# Patient Record
Sex: Female | Born: 1965 | Race: White | Hispanic: No | Smoking: Never smoker
Health system: Southern US, Community
[De-identification: ages and names within clinical notes are randomized; demographics above are authoritative.]

## PROBLEM LIST (undated history)

## (undated) DIAGNOSIS — R112 Nausea with vomiting, unspecified: Secondary | ICD-10-CM

## (undated) DIAGNOSIS — F329 Major depressive disorder, single episode, unspecified: Secondary | ICD-10-CM

## (undated) DIAGNOSIS — H269 Unspecified cataract: Secondary | ICD-10-CM

## (undated) DIAGNOSIS — F32A Depression, unspecified: Secondary | ICD-10-CM

## (undated) DIAGNOSIS — Z9889 Other specified postprocedural states: Secondary | ICD-10-CM

## (undated) DIAGNOSIS — C50919 Malignant neoplasm of unspecified site of unspecified female breast: Secondary | ICD-10-CM

## (undated) HISTORY — PX: WISDOM TOOTH EXTRACTION: SHX21

## (undated) HISTORY — DX: Malignant neoplasm of unspecified site of unspecified female breast: C50.919

## (undated) HISTORY — PX: ENDOMETRIAL ABLATION: SHX621

## (undated) HISTORY — PX: BASAL CELL CARCINOMA EXCISION: SHX1214

## (undated) HISTORY — PX: PLACEMENT OF BREAST IMPLANTS: SHX6334

## (undated) HISTORY — DX: Unspecified cataract: H26.9

## (undated) HISTORY — PX: DILATION AND CURETTAGE OF UTERUS: SHX78

---

## 1998-02-14 HISTORY — PX: REFRACTIVE SURGERY: SHX103

## 2003-09-02 ENCOUNTER — Other Ambulatory Visit: Admission: RE | Admit: 2003-09-02 | Discharge: 2003-09-02 | Payer: Self-pay | Admitting: Internal Medicine

## 2004-12-22 ENCOUNTER — Other Ambulatory Visit: Admission: RE | Admit: 2004-12-22 | Discharge: 2004-12-22 | Payer: Self-pay | Admitting: Internal Medicine

## 2005-03-29 ENCOUNTER — Encounter: Admission: RE | Admit: 2005-03-29 | Discharge: 2005-03-29 | Payer: Self-pay | Admitting: Internal Medicine

## 2005-12-19 ENCOUNTER — Other Ambulatory Visit: Admission: RE | Admit: 2005-12-19 | Discharge: 2005-12-19 | Payer: Self-pay | Admitting: Family Medicine

## 2006-05-02 ENCOUNTER — Encounter: Admission: RE | Admit: 2006-05-02 | Discharge: 2006-05-02 | Payer: Self-pay | Admitting: Family Medicine

## 2008-02-15 HISTORY — PX: ANTERIOR CRUCIATE LIGAMENT REPAIR: SHX115

## 2008-07-01 ENCOUNTER — Other Ambulatory Visit: Admission: RE | Admit: 2008-07-01 | Discharge: 2008-07-01 | Payer: Self-pay | Admitting: Family Medicine

## 2008-08-25 ENCOUNTER — Other Ambulatory Visit: Admission: RE | Admit: 2008-08-25 | Discharge: 2008-08-25 | Payer: Self-pay | Admitting: Family Medicine

## 2009-11-25 ENCOUNTER — Other Ambulatory Visit: Admission: RE | Admit: 2009-11-25 | Discharge: 2009-11-25 | Payer: Self-pay | Admitting: Family Medicine

## 2009-12-16 ENCOUNTER — Ambulatory Visit (HOSPITAL_COMMUNITY): Admission: RE | Admit: 2009-12-16 | Discharge: 2009-12-16 | Payer: Self-pay | Admitting: Family Medicine

## 2010-02-26 ENCOUNTER — Encounter
Admission: RE | Admit: 2010-02-26 | Discharge: 2010-02-26 | Payer: Self-pay | Source: Home / Self Care | Attending: Family Medicine | Admitting: Family Medicine

## 2010-12-02 ENCOUNTER — Other Ambulatory Visit: Payer: Self-pay | Admitting: Obstetrics and Gynecology

## 2010-12-02 ENCOUNTER — Other Ambulatory Visit (HOSPITAL_COMMUNITY)
Admission: RE | Admit: 2010-12-02 | Discharge: 2010-12-02 | Disposition: A | Discharge status: Home / Self Care | Payer: BC Managed Care – PPO | Source: Ambulatory Visit | Attending: Obstetrics and Gynecology | Admitting: Obstetrics and Gynecology

## 2010-12-02 DIAGNOSIS — Z01419 Encounter for gynecological examination (general) (routine) without abnormal findings: Secondary | ICD-10-CM | POA: Insufficient documentation

## 2011-01-31 ENCOUNTER — Other Ambulatory Visit (HOSPITAL_COMMUNITY): Payer: Self-pay | Admitting: Family Medicine

## 2011-01-31 DIAGNOSIS — R197 Diarrhea, unspecified: Secondary | ICD-10-CM

## 2011-02-18 ENCOUNTER — Other Ambulatory Visit (HOSPITAL_COMMUNITY): Payer: BC Managed Care – PPO

## 2011-06-30 ENCOUNTER — Other Ambulatory Visit: Payer: Self-pay | Admitting: Dermatology

## 2011-12-06 ENCOUNTER — Other Ambulatory Visit (HOSPITAL_COMMUNITY)
Admission: RE | Admit: 2011-12-06 | Discharge: 2011-12-06 | Disposition: A | Discharge status: Home / Self Care | Payer: BC Managed Care – PPO | Source: Ambulatory Visit | Attending: Obstetrics and Gynecology | Admitting: Obstetrics and Gynecology

## 2011-12-06 ENCOUNTER — Other Ambulatory Visit: Payer: Self-pay | Admitting: Obstetrics and Gynecology

## 2011-12-06 DIAGNOSIS — Z01419 Encounter for gynecological examination (general) (routine) without abnormal findings: Secondary | ICD-10-CM | POA: Insufficient documentation

## 2012-03-20 ENCOUNTER — Other Ambulatory Visit: Payer: Self-pay | Admitting: Radiology

## 2012-03-20 DIAGNOSIS — C50919 Malignant neoplasm of unspecified site of unspecified female breast: Secondary | ICD-10-CM

## 2012-03-20 HISTORY — DX: Malignant neoplasm of unspecified site of unspecified female breast: C50.919

## 2012-03-21 ENCOUNTER — Other Ambulatory Visit: Payer: Self-pay | Admitting: Radiology

## 2012-03-21 DIAGNOSIS — C50911 Malignant neoplasm of unspecified site of right female breast: Secondary | ICD-10-CM

## 2012-03-22 ENCOUNTER — Telehealth: Payer: Self-pay | Admitting: *Deleted

## 2012-03-22 DIAGNOSIS — C50419 Malignant neoplasm of upper-outer quadrant of unspecified female breast: Secondary | ICD-10-CM | POA: Insufficient documentation

## 2012-03-22 NOTE — Telephone Encounter (Signed)
Confirmed [facility] for 03/28/12 at 0800 .  Instructions and contact information given.

## 2012-03-23 ENCOUNTER — Ambulatory Visit
Admission: RE | Admit: 2012-03-23 | Discharge: 2012-03-23 | Disposition: A | Discharge status: Home / Self Care | Payer: BC Managed Care – PPO | Source: Ambulatory Visit | Attending: Radiology | Admitting: Radiology

## 2012-03-23 DIAGNOSIS — C50911 Malignant neoplasm of unspecified site of right female breast: Secondary | ICD-10-CM

## 2012-03-23 MED ORDER — GADOBENATE DIMEGLUMINE 529 MG/ML IV SOLN: 15.0000 mL | Freq: Once | INTRAVENOUS | Status: AC | PRN

## 2012-03-28 ENCOUNTER — Encounter: Payer: Self-pay | Admitting: *Deleted

## 2012-03-28 ENCOUNTER — Ambulatory Visit
Admission: RE | Admit: 2012-03-28 | Discharge: 2012-03-28 | Disposition: A | Discharge status: Home / Self Care | Payer: BC Managed Care – PPO | Source: Ambulatory Visit | Attending: Radiation Oncology | Admitting: Radiation Oncology

## 2012-03-28 ENCOUNTER — Ambulatory Visit: Payer: BC Managed Care – PPO | Attending: Surgery | Admitting: Physical Therapy

## 2012-03-28 ENCOUNTER — Ambulatory Visit (HOSPITAL_BASED_OUTPATIENT_CLINIC_OR_DEPARTMENT_OTHER): Payer: BC Managed Care – PPO | Admitting: Oncology

## 2012-03-28 ENCOUNTER — Ambulatory Visit (HOSPITAL_BASED_OUTPATIENT_CLINIC_OR_DEPARTMENT_OTHER): Payer: BC Managed Care – PPO

## 2012-03-28 ENCOUNTER — Other Ambulatory Visit (HOSPITAL_BASED_OUTPATIENT_CLINIC_OR_DEPARTMENT_OTHER): Payer: BC Managed Care – PPO | Admitting: Lab

## 2012-03-28 ENCOUNTER — Encounter (INDEPENDENT_AMBULATORY_CARE_PROVIDER_SITE_OTHER): Payer: Self-pay | Admitting: Surgery

## 2012-03-28 ENCOUNTER — Ambulatory Visit (HOSPITAL_BASED_OUTPATIENT_CLINIC_OR_DEPARTMENT_OTHER): Payer: BLUE CROSS/BLUE SHIELD | Admitting: Surgery

## 2012-03-28 VITALS — BP 139/89 | HR 71 | Temp 97.8°F | Resp 20 | Ht 65.75 in | Wt 166.0 lb

## 2012-03-28 VITALS — BP 139/89 | HR 71 | Temp 97.8°F | Resp 20 | Ht 65.75 in | Wt 166.1 lb

## 2012-03-28 DIAGNOSIS — Z171 Estrogen receptor negative status [ER-]: Secondary | ICD-10-CM

## 2012-03-28 DIAGNOSIS — C50419 Malignant neoplasm of upper-outer quadrant of unspecified female breast: Secondary | ICD-10-CM

## 2012-03-28 DIAGNOSIS — C50919 Malignant neoplasm of unspecified site of unspecified female breast: Secondary | ICD-10-CM

## 2012-03-28 DIAGNOSIS — M25619 Stiffness of unspecified shoulder, not elsewhere classified: Secondary | ICD-10-CM | POA: Insufficient documentation

## 2012-03-28 DIAGNOSIS — IMO0001 Reserved for inherently not codable concepts without codable children: Secondary | ICD-10-CM | POA: Insufficient documentation

## 2012-03-28 DIAGNOSIS — C50911 Malignant neoplasm of unspecified site of right female breast: Secondary | ICD-10-CM

## 2012-03-28 DIAGNOSIS — Z803 Family history of malignant neoplasm of breast: Secondary | ICD-10-CM

## 2012-03-28 DIAGNOSIS — Z8041 Family history of malignant neoplasm of ovary: Secondary | ICD-10-CM

## 2012-03-28 LAB — CBC WITH DIFFERENTIAL/PLATELET
BASO%: 0.6 % (ref 0.0–2.0)
EOS%: 2 % (ref 0.0–7.0)
HCT: 39.1 % (ref 34.8–46.6)
LYMPH%: 33.2 % (ref 14.0–49.7)
MCH: 34 pg (ref 25.1–34.0)
MCHC: 35.6 g/dL (ref 31.5–36.0)
MCV: 95.6 fL (ref 79.5–101.0)
MONO%: 10.5 % (ref 0.0–14.0)
NEUT%: 53.7 % (ref 38.4–76.8)
lymph#: 2 10*3/uL (ref 0.9–3.3)

## 2012-03-28 LAB — COMPREHENSIVE METABOLIC PANEL (CC13)
ALT: 23 U/L (ref 0–55)
AST: 26 U/L (ref 5–34)
Alkaline Phosphatase: 58 U/L (ref 40–150)
BUN: 12.1 mg/dL (ref 7.0–26.0)
Creatinine: 0.9 mg/dL (ref 0.6–1.1)
Total Bilirubin: 1.06 mg/dL (ref 0.20–1.20)

## 2012-03-28 NOTE — Progress Notes (Signed)
Patient ID: Norma [name], female   DOB: 15-Jul-1965, 47 y.o.   MRN: [id number]  Chief Complaint  Patient presents with  . Breast Cancer    right, possible left    HPI Norma [name] is a 47 y.o. female.  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 She is asymptomatic. She had a mammogram showing an area of architectural distortion a biopsy has shown cancer. An MRI showed a very suspicious area on the left side. She strong family history of different types of cancer and one family member is BRCA2 positive. She is having no other breast symptoms. HPI  Past Medical History  Diagnosis Date  . Breast cancer     Past Surgical History  Procedure Laterality Date  . Anterior cruciate ligament repair  2010    Family History  Problem Relation Age of Onset  . Breast cancer Maternal Aunt   . Ovarian cancer Maternal Aunt   . Esophageal cancer Maternal Uncle   . Liver cancer Maternal Uncle   . Lung cancer Maternal Uncle   . Colon cancer Paternal Aunt   . Breast cancer Cousin   . Pancreatic cancer Maternal Uncle     Social History History  Substance Use Topics  . Smoking status: Never Smoker   . Smokeless tobacco: Not on file  . Alcohol Use: Yes    Allergies  Allergen Reactions  . Penicillins Nausea Only and Rash    Current Outpatient Prescriptions  Medication Sig Dispense Refill  . buPROPion (WELLBUTRIN XL) 300 MG 24 hr tablet Take 300 mg by mouth daily.      Marland Kitchen escitalopram (LEXAPRO) 10 MG tablet Take 10 mg by mouth daily.       No current facility-administered medications for this visit.    Review of Systems Review of Systems  Constitutional: Negative for fever, chills and unexpected weight change.  HENT: Negative for hearing loss, congestion, sore throat, trouble swallowing and voice change.   Eyes: Negative for visual disturbance.  Respiratory: Negative for cough and wheezing.    Cardiovascular: Negative for chest pain, palpitations and leg swelling.  Gastrointestinal: Negative for nausea, vomiting, abdominal pain, diarrhea, constipation, blood in stool, abdominal distention and anal bleeding.  Genitourinary: Negative for hematuria, vaginal bleeding and difficulty urinating.  Musculoskeletal: Negative for arthralgias.  Skin: Negative for rash and wound.  Neurological: Negative for seizures, syncope and headaches.  Hematological: Negative for adenopathy. Does not bruise/bleed easily.  Psychiatric/Behavioral: Negative for confusion.    Blood pressure 139/89, pulse 71, temperature 97.8 F (36.6 C), resp. rate 20, height 5' 5.75" (1.67 m), weight 166 lb (75.297 kg).  Physical Exam Physical Exam  Vitals reviewed. Constitutional: She is oriented to person, place, and time. She appears well-developed and well-nourished. No distress.  HENT:  Head: Normocephalic and atraumatic.  Mouth/Throat: Oropharynx is clear and moist.  Eyes: Conjunctivae and EOM are normal. Pupils are equal, round, and reactive to light. No scleral icterus.  Neck: Normal range of motion. Neck supple. No tracheal deviation present. No thyromegaly present.  Cardiovascular: Normal rate, regular rhythm, normal heart sounds and intact distal pulses.  Exam reveals no gallop and no friction rub.   No murmur heard. Pulmonary/Chest: Effort normal and breath sounds normal. No respiratory distress. She has no wheezes. She has no rales. Right breast exhibits no inverted nipple, no mass, no nipple discharge, no skin change and no tenderness. Left breast exhibits no mass, no nipple discharge, no  skin change and no tenderness. Breasts are symmetrical.  Abdominal: Soft. Bowel sounds are normal. She exhibits no distension and no mass. There is no tenderness. There is no rebound and no guarding.  Musculoskeletal: Normal range of motion. She exhibits no edema and no tenderness.  Lymphadenopathy:    She has no cervical  adenopathy.    She has no axillary adenopathy.       Right: No supraclavicular adenopathy present.       Left: No supraclavicular adenopathy present.  Neurological: She is alert and oriented to person, place, and time.  Skin: Skin is warm and dry. No rash noted. She is not diaphoretic. No erythema.  Psychiatric: She has a normal mood and affect. Her behavior is normal. Judgment and thought content normal.    Data Reviewed I have reviewed the mammogram films and reports, the MRI films and reports and review them with the radiologist. I seen the pathology slides to review them with the pathologist. I discussed her situation with medical and radiation oncologist.  Assessment    Right breast cancer, invasive ductal, triple negative Suspicious mass left breast and, biopsy pending Family history of BRCA2 positive     Plan    I have explained the pathophysiology and staging of breast cancer with particular attention to her exact situation. We discussed the multidisciplinary approach to breast cancer which often includes both medical and radiation oncology consultations.  We also discussed surgical options for the treatment of breast cancer including lumpectomy and mastectomy with possible reconstructive surgery. In addition we talked about the evaluation and management of lymph nodes including a description of sentinel lymph node biopsy and axillary dissections. We reviewed potential complications and risks including bleeding, infection, numbness,  lymphedema, and the potential need for additional surgery.  She understands that for patients who are candidate for lumpectomy or mastectomy there is an equal survival rate with either technique, but a slightly higher local recurrence rate with lumpectomy. In addition she knows that a lumpectomy usually requires postoperative radiation as part of the management of the breast cancer.  We have discussed the likely postoperative course and plans for  followup.  I have given the patient some written information that reviewed all of these issues. I believe her questions are answered and that she has a good understanding of the issues.  Really uncertain of what to do. If her genetic history is negative I think she could do bilateral lumpectomy sentinel nodes with postoperative radiation therapy. An alternative would be bilateral mastectomies and she would be a candidate for immediate reconstruction it was discussed that. She has a Dentist at appointment pending. I think she needs some time to all over her options and situation and make a decision. She is currently scheduled for a biopsy of the left side to confirm whether or not this is malignant.        Annaliesa Blann J 03/28/2012, 11:21 AM

## 2012-03-28 NOTE — Progress Notes (Signed)
Radiation Oncology         [id number]) [phone] ________________________________  Initial outpatient Consultation - Date: 03/28/2012   Name: Norma [name] MRN: [id number]   DOB: 05/01/1965  REFERRING PHYSICIAN: Currie Paris, MD  DIAGNOSIS: The encounter diagnosis was Cancer of upper-outer quadrant of female breast.  HISTORY OF PRESENT ILLNESS::Norma [name] is a 47 y.o. female  who has a history of a cousin with breast cancer. This cousin tested positive for a unknown mutation. She was undergoing regular screening mammograms when a architectural distortion was noted in the right breast. Ultrasound confirmed a 3 x 4 x 3 mm mass. MRI showed a 2 x 1 x 1 cm mass in the area of concern. Biopsy was positive for a grade 2 invasive ductal carcinoma which was triple negative with a Ki-67 of 100%. MRI also showed an abnormality measuring 2 x 1.3 x 1.1 cm. A second look ultrasound was negative. An MRI biopsy is pending. He is a well since her biopsy. She has met with Dr. Darnelle Catalan and Dr. Jamey Ripa. She has no breast related complaints. She has no history of breast problems.  PREVIOUS RADIATION THERAPY: No  PAST MEDICAL HISTORY:  has a past medical history of Breast cancer.    PAST SURGICAL HISTORY: Past Surgical History  Procedure Laterality Date  . Anterior cruciate ligament repair  2010    FAMILY HISTORY: @[facility] @  SOCIAL HISTORY:  History  Substance Use Topics  . Smoking status: Never Smoker   . Smokeless tobacco: Not on file  . Alcohol Use: Yes    ALLERGIES: Penicillins  MEDICATIONS:  Current Outpatient Prescriptions  Medication Sig Dispense Refill  . buPROPion (WELLBUTRIN XL) 300 MG 24 hr tablet Take 300 mg by mouth daily.      Marland Kitchen escitalopram (LEXAPRO) 10 MG tablet Take 10 mg by mouth daily.       No current facility-administered medications for this encounter.    REVIEW OF SYSTEMS:  A 15 point review of systems is documented in the electronic medical record. This was obtained by  the nursing staff. However, I reviewed this with the patient to discuss relevant findings and make appropriate changes.  Pertinent items are noted in HPI.   PHYSICAL EXAM: There were no vitals filed for this visit.. She is a pleasant female who appears her stated age sitting comfortably examining table. She has no palpable cervical subclavicular or axillary adenopathy bilaterally. She has bruising slightly of the right breast. No abnormalities palpable up the left breast. She is alert minus x3. She has 5 out of 5 strength bilaterally.  LABORATORY DATA:  Lab Results  Component Value Date   WBC 6.1 03/28/2012   HGB 13.9 03/28/2012   HCT 39.1 03/28/2012   MCV 95.6 03/28/2012   PLT 202 03/28/2012   Lab Results  Component Value Date   NA 139 03/28/2012   K 4.6 03/28/2012   CL 105 03/28/2012   CO2 26 03/28/2012   Lab Results  Component Value Date   ALT 23 03/28/2012   AST 26 03/28/2012   ALKPHOS 58 03/28/2012   BILITOT 1.06 03/28/2012     RADIOGRAPHY: Mr Breast Bilateral W Wo Contrast  03/23/2012  *RADIOLOGY REPORT*  Clinical Data: new diagnosis of invasive ductal carcinoma right 9 o'clock, for treatment planning  BILATERAL BREAST MRI WITH AND WITHOUT CONTRAST  Technique: Multiplanar, multisequence MR images of both breasts were obtained prior to and following the intravenous administration of 15ml of Multihance.  Three dimensional images were evaluated  at the independent DynaCad workstation.  Comparison:  bilateral mammogram 03/15/12  Findings: There is moderate background parenchymal enhancement. There is no adenopathy and there is no abnormal skin or nipple enhancement.  On the right, there is marker clip artifact in the posterior 9 o'clock position at the site of recent biopsy demonstrating invasive carcinoma.  The clip is located centrally within an area of poorly defined enhancement which measures about 2 x 1 x 1cm in diameter and which is mostly obscured by clip artifact.  On the left, posteriorly in  the 2 o'clock position, there is an enhancing mass measuring 20 x 13 x 11mm, showing mostly persistent and plateau kinetics with a tubular macrolobulated appearnce.  It also demonstrates mild internal and surrounding T2 hyperintensity with no corresponding findings on the recent mammogram.  IMPRESSION: In addition to the know carcinoma on the right, there is also an area of enhancement on the left in the posterior 2 o'clock position that is suspicious for possible mass.  BI-RADS CATEGORY 4:  Suspicious abnormality - biopsy should be considered.  RECOMMENDATION: A second look left ultrasound is recommended, with subsequent ultrasound-guided core biopsy if the finding is sonographically visible.  If the mass in the 2 o'clock position of the left breast is not visible by ultrasound, then MRI-guided core needle biopsy would be suggested.  THREE-DIMENSIONAL MR IMAGE RENDERING ON INDEPENDENT WORKSTATION:  Three-dimensional MR images were rendered by post-processing of the original MR data on an independent workstation.  The three- dimensional MR images were interpreted, and findings were reported in the accompanying complete MRI report for this study.   Original Report Authenticated By: Esperanza Heir, M.D.       IMPRESSION: T2 N0 invasive ductal carcinoma of the right breast and a possible abnormality of the left breast in a patient with a family history of a genetic mutation  PLAN: I spoke to the patient her family. She is unclear as to what decision she would like to move forward with. We discussed the genetics prior to her surgery and are getting a set up for her. We discussed that there really is no survival benefit to 2 bilateral mastectomies in a patient who carries a genetic medication. We discussed that if she chose she could have bilateral breast conservation. This of course is if this abnormality and left breast are.be positive. If she does elect a mastectomy she will would likely not require radiation and  I think she would be a candidate for immediate reconstruction. The only reason she would require postmastectomy radiation is if she had a positive lymph node, positive margins, or a tumor size that was Baxley greater than what we see on MRI. If she would like to proceed forward with bilateral breast conservation she would require bilateral breast radiation. We discussed the process of simulation the placement tattoos. We discussed skin redness and fatigue as possible side effects. We discussed 6 weeks of treatment as an outpatient. She of course will have a Port-A-Cath placed and chemotherapy. He has met with our physical therapist as well as member of our patient family support staff. I have not scheduled followup with me. I would be happy to see her back after her chemotherapy in her surgery either to discuss radiation in the postlumpectomy setting or to discuss why she would not need radiation in the postmastectomy setting.  I spent 40 minutes  face to face with the patient and more than 50% of that time was spent in counseling and/or coordination of  care.   ------------------------------------------------  [name], MD

## 2012-03-28 NOTE — Patient Instructions (Signed)
After you have seen and discussed your situation with a genetic counselor and havesome time to think over your options we can make definitive surgical plans.

## 2012-03-29 ENCOUNTER — Telehealth: Payer: Self-pay | Admitting: Oncology

## 2012-03-29 LAB — CANCER ANTIGEN 27.29: CA 27.29: 23 U/mL (ref 0–39)

## 2012-03-29 NOTE — Telephone Encounter (Signed)
Sent [facility]a [name] a staff message regarding dr Darnelle Catalan wanting an appt with her asap.

## 2012-03-29 NOTE — Telephone Encounter (Signed)
[facility] adviisng the pt of her chemo educ class along with the md appt in feb with dr Darnelle Catalan

## 2012-03-29 NOTE — Progress Notes (Signed)
ID: Norma [name]   DOB: [birth year]  MR#: [id number]  WUJ#:[id number]  PCP: Norma [name] GYN: Norma [name] SU: Norma [name] OTHER MD: Norma [name], Norma [name], Norma [name] [name]   HISTORY OF PRESENT ILLNESS: Norma [name] had routine screening mammography at [name] 11/16/2010 suggesting additional imaging on the right. This was performed the next day, and found no significant residual abnormality. Repeat screening mammography 03/15/2012 again suggested an area of architectural distortion in the right breast. In the same quadrant as before. Additional views 03/19/2012 found a 3 mm hypoechoic mass in the area in question. Biopsy of this mass 03/20/2012 showed an invasive ductal carcinoma, grade 1-2, triple negative, with an MIB-1 of 100%.  Breast MRI 03/23/2012 showed an area of poorly defined enhancement in the right breast associated with clip artifact. This measured 1 cm. In the left breast there was an enhancing mass measuring 2.0 cm. There were no other suspicious findings. The patient's subsequent history is as detailed below.  INTERVAL HISTORY: Norma [name] was seen at the multidisciplinary breast cancer clinic 03/28/2012, accompanied by her husband Norma [name]  REVIEW OF SYSTEMS: She was having no symptoms leading to the mammogram, which was routine. She denies any unusual headaches, visual changes, cough, phlegm production, pleurisy, shortness of breath, chest pain or pressure, any change in bowel or bladder habits, or any rash, bleeding, unexplained fatigue, unexplained weight loss, or fever. A detailed review of systems today was noncontributory.  PAST MEDICAL HISTORY: Past Medical History  Diagnosis Date  . Breast cancer     PAST SURGICAL HISTORY: Past Surgical History  Procedure Laterality Date  . Anterior cruciate ligament repair  2010  Moh's surgery for basal cell, Left nares  FAMILY HISTORY Family History  Problem Relation Age of Onset  . Breast cancer Maternal Aunt   . Ovarian cancer  Maternal Aunt   . Esophageal cancer Maternal Uncle   . Liver cancer Maternal Uncle   . Lung cancer Maternal Uncle   . Colon cancer Paternal Aunt   . Breast cancer Cousin   . Pancreatic cancer Maternal Uncle    the patient's parents are alive, in their early 60s. The patient has one brother and one sister. There is a significant family history of cancer and this includes one of the patient's mother is 2 sisters with ovarian and breast cancer. A first cousin of the patient had breast cancer in her 30s. She has been tested for the BRCA gene, but the patient does not know the results. There were also brothers of the patient's mother with esophageal pancreas and colon cancer. The patient has been scheduled for genetic testing.  GYNECOLOGIC HISTORY: Menarche age 37, first live birth age 42, she is GX P2. The patient's periods are increase in bili scans and irregular, but still ongoing. She did take birth control pills for approximately 10 years with no untoward events.  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 Her husband Norma [name] is a Academic librarian. He owns his own business. He is originally from [name]. Their children are Norma [name] 17 and Norma [name] 14.   ADVANCED DIRECTIVES: Not in place. If both she and her husband become disabled and cannot make her own health care decisions, the patient intends to name her friend Norma [name] as healthcare power of attorney. Otherwise of course she and [facility]a [name] will be each other self care power of attorney  HEALTH MAINTENANCE: History  Substance Use Topics  . Smoking status: Never Smoker   .  Smokeless tobacco: Not on file  . Alcohol Use: Yes     Colonoscopy:  PAP:  Bone density:  Lipid panel:  Allergies  Allergen Reactions  . Penicillins Nausea Only and Rash    Current Outpatient Prescriptions  Medication Sig Dispense Refill  . buPROPion (WELLBUTRIN XL) 300 MG 24 hr tablet Take 300 mg by mouth  daily.      Marland Kitchen escitalopram (LEXAPRO) 10 MG tablet Take 10 mg by mouth daily.       No current facility-administered medications for this visit.    OBJECTIVE: Middle-aged white woman who appears well Filed Vitals:   03/28/12 0843  BP: 139/89  Pulse: 71  Temp: 97.8 F (36.6 C)  Resp: 20     Body mass index is 27.01 kg/(m^2).    ECOG FS: 0  Sclerae unicteric Oropharynx clear No cervical or supraclavicular adenopathy Lungs no rales or rhonchi Heart regular rate and rhythm Abd benign MSK no focal spinal tenderness, no peripheral edema Neuro: nonfocal Breasts: The right breast is status post recent biopsy. There is a minimal ecchymosis. The right axilla is benign. I do not palpate a definite mass in the left breast.   LAB RESULTS: Lab Results  Component Value Date   WBC 6.1 03/28/2012   NEUTROABS 3.3 03/28/2012   HGB 13.9 03/28/2012   HCT 39.1 03/28/2012   MCV 95.6 03/28/2012   PLT 202 03/28/2012      Chemistry      Component Value Date/Time   NA 139 03/28/2012 0826   K 4.6 03/28/2012 0826   CL 105 03/28/2012 0826   CO2 26 03/28/2012 0826   BUN 12.1 03/28/2012 0826   CREATININE 0.9 03/28/2012 0826      Component Value Date/Time   CALCIUM 9.3 03/28/2012 0826   ALKPHOS 58 03/28/2012 0826   AST 26 03/28/2012 0826   ALT 23 03/28/2012 0826   BILITOT 1.06 03/28/2012 0826       Lab Results  Component Value Date   LABCA2 23 03/28/2012    No components found with this basename: [id number]    No results found for this basename: INR,  in the last 168 hours  Urinalysis No results found for this basename: colorurine, appearanceur, labspec, phurine, glucoseu, hgbur, bilirubinur, ketonesur, proteinur, urobilinogen, nitrite, leukocytesur    STUDIES: Mr Breast Bilateral W Wo Contrast  03/23/2012  *RADIOLOGY REPORT*  Clinical Data: new diagnosis of invasive ductal carcinoma right 9 o'clock, for treatment planning  BILATERAL BREAST MRI WITH AND WITHOUT CONTRAST  Technique: Multiplanar,  multisequence MR images of both breasts were obtained prior to and following the intravenous administration of 15ml of Multihance.  Three dimensional images were evaluated at the independent DynaCad workstation.  Comparison:  bilateral mammogram 03/15/12  Findings: There is moderate background parenchymal enhancement. There is no adenopathy and there is no abnormal skin or nipple enhancement.  On the right, there is marker clip artifact in the posterior 9 o'clock position at the site of recent biopsy demonstrating invasive carcinoma.  The clip is located centrally within an area of poorly defined enhancement which measures about 2 x 1 x 1cm in diameter and which is mostly obscured by clip artifact.  On the left, posteriorly in the 2 o'clock position, there is an enhancing mass measuring 20 x 13 x 11mm, showing mostly persistent and plateau kinetics with a tubular macrolobulated appearnce.  It also demonstrates mild internal and surrounding T2 hyperintensity with no corresponding findings on the recent mammogram.  IMPRESSION: In  addition to the know carcinoma on the right, there is also an area of enhancement on the left in the posterior 2 o'clock position that is suspicious for possible mass.  BI-RADS CATEGORY 4:  Suspicious abnormality - biopsy should be considered.  RECOMMENDATION: A second look left ultrasound is recommended, with subsequent ultrasound-guided core biopsy if the finding is sonographically visible.  If the mass in the 2 o'clock position of the left breast is not visible by ultrasound, then MRI-guided core needle biopsy would be suggested.  THREE-DIMENSIONAL MR IMAGE RENDERING ON INDEPENDENT WORKSTATION:  Three-dimensional MR images were rendered by post-processing of the original MR data on an independent workstation.  The three- dimensional MR images were interpreted, and findings were reported in the accompanying complete MRI report for this study.   Original Report Authenticated By: Esperanza Heir, M.D.     ASSESSMENT: 47 y.o. Pura Spice woman status post right breast biopsy 03/20/2012 for a clinical T1a N0, stage IA invasive ductal carcinoma, grade 1-2, triple negative, with an MIB-1 of 100%  (2) biopsy of a clinical T2 N0, stage IIA left breast mass is pending  (3) genetics testing in progress  PLAN: We spent the better part of her off I and long visit today discussing the biology of breast cancer in general and treatment options. In her particular situation, she will definitely need chemotherapy since that is the only available systemic treatment for the right-sided breast cancer. Of course we do not know what she has in the left breast.   We also discussed extensively the difference between bilateral lumpectomies with radiation, and bilateral mastectomies without postmastectomy radiation. The patient understands there is no survival advantage to either of these approaches. On the other hand if she does have a BRCA mutation, her risk of developing another breast cancer in the future may be sufficiently large that she may prefer bilateral mastectomies.  Accordingly we have requested that genetics testing be done urgently. She will in any case need a port. She will benefit from coming to "chemotherapy school". Tentatively I am making her a return appointment with me for 6 weeks, but of course we will be glad to see her before then especially if she needs further discussion to make her definitive local treatment decisions.   MAGRINAT,GUSTAV C    03/29/2012

## 2012-03-30 ENCOUNTER — Telehealth: Payer: Self-pay | Admitting: *Deleted

## 2012-03-30 ENCOUNTER — Telehealth: Payer: Self-pay | Admitting: Oncology

## 2012-03-30 NOTE — Telephone Encounter (Signed)
[facility] advising the pt of her genetic testing appt on 04/03/2012@10 :00am

## 2012-03-30 NOTE — Telephone Encounter (Signed)
Pt returned my call and I confirmed 04/03/12 genetic appt w/ pt.

## 2012-03-30 NOTE — Telephone Encounter (Signed)
Left message for pt to return my call so I can confirm her genetic appt.

## 2012-04-02 ENCOUNTER — Other Ambulatory Visit: Payer: Self-pay | Admitting: Radiology

## 2012-04-02 DIAGNOSIS — R928 Other abnormal and inconclusive findings on diagnostic imaging of breast: Secondary | ICD-10-CM

## 2012-04-03 ENCOUNTER — Other Ambulatory Visit: Payer: BC Managed Care – PPO | Admitting: Lab

## 2012-04-03 ENCOUNTER — Ambulatory Visit (HOSPITAL_BASED_OUTPATIENT_CLINIC_OR_DEPARTMENT_OTHER): Payer: BC Managed Care – PPO | Admitting: Genetic Counselor

## 2012-04-03 ENCOUNTER — Encounter: Payer: Self-pay | Admitting: Genetic Counselor

## 2012-04-03 ENCOUNTER — Telehealth: Payer: Self-pay | Admitting: *Deleted

## 2012-04-03 DIAGNOSIS — C50419 Malignant neoplasm of upper-outer quadrant of unspecified female breast: Secondary | ICD-10-CM

## 2012-04-03 DIAGNOSIS — Z809 Family history of malignant neoplasm, unspecified: Secondary | ICD-10-CM

## 2012-04-03 DIAGNOSIS — C50919 Malignant neoplasm of unspecified site of unspecified female breast: Secondary | ICD-10-CM

## 2012-04-03 DIAGNOSIS — IMO0002 Reserved for concepts with insufficient information to code with codable children: Secondary | ICD-10-CM

## 2012-04-03 DIAGNOSIS — Z803 Family history of malignant neoplasm of breast: Secondary | ICD-10-CM

## 2012-04-03 NOTE — Telephone Encounter (Signed)
Left vm for pt to return call regarding [facility] from 03/28/12.

## 2012-04-03 NOTE — Telephone Encounter (Signed)
Pt return call regarding [facility] from 03/28/12.  Pt denies questions or concerns on dx or treatment care plan.  Confirmed future appts. Encourage pt to call with needs.  Received verbal understanding.  Contact information given.

## 2012-04-03 NOTE — Progress Notes (Signed)
Dr.  Darnelle Catalan requested a consultation for genetic counseling and risk assessment for [place]a [name], a 47 y.o. female, for discussion of her personal history of triple negative breast cancer and family history of breast, pancreatic, ovarian, colon and esophageal cancer and a known family BRCA2 mutation. She presents to clinic today to discuss the possibility of a genetic predisposition to cancer, and to further clarify her risks, as well as her family members' risks for cancer.   HISTORY OF PRESENT ILLNESS: In 2014, at the age of 55, Norma [name] was diagnosed with triple negative breast cancer.    Past Medical History  Diagnosis Date  . Breast cancer     Past Surgical History  Procedure Laterality Date  . Anterior cruciate ligament repair  2010    History  Substance Use Topics  . Smoking status: Never Smoker   . Smokeless tobacco: Not on file  . Alcohol Use: Yes    REPRODUCTIVE HISTORY AND PERSONAL RISK ASSESSMENT FACTORS: Menarche was at age 2.   Premenopausal Uterus Intact: Yes Ovaries Intact: Yes G2P2A0 , first live birth at age 84  She has not previously undergone treatment for infertility.   OCP use for 14 years   She has not used HRT in the past.    FAMILY HISTORY:  We obtained a detailed, 4-generation family history.  Significant diagnoses are listed below: Family History  Problem Relation Age of Onset  . Breast cancer Maternal Aunt     dx in her 16s  . Ovarian cancer Maternal Aunt     dx in her 65s  . Esophageal cancer Maternal Uncle   . Liver cancer Maternal Uncle   . Lung cancer Maternal Uncle   . BRCA 1/2 Maternal Uncle     BRCA 2 +  . Breast cancer Cousin     dx in her 30s; BRCA2+  . Pancreatic cancer Maternal Uncle     dx in his 69s  . BRCA 1/2 Maternal Uncle     BRCA2+  . Colon cancer Maternal Uncle   the patient was diagnosed with triple negative breast cancer.  She has a healthy brother and sister.  Her mother has not been diagnosed with  cancer.  Her mother has seven brothers, one full sister and one paternal half sister.  The half sister had breast and ovarian cancer.  One brother was diagnosed with pancreatic cancer in his 7s, another had esophageal cancer and a third had colon cancer.  Two of these uncles have tested positive for a BRCA2 mutation.  One maternal cousin was diagnosed with breast cancer in her 30s.  She has tested positive for a BRCA2 mutation.  Patient's maternal ancestors are of [place] descent, and paternal ancestors are of [place] and [place] descent. There is no reported Ashkenazi Jewish ancestry. There is no  known consanguinity.  GENETIC COUNSELING RISK ASSESSMENT, DISCUSSION, AND SUGGESTED FOLLOW UP: We reviewed the natural history and genetic etiology of sporadic, familial and hereditary cancer syndromes.  About 5-10% of breast cancer is hereditary.  Of this, about 85% is the result of a BRCA1 or BRCA2 mutation.  We reviewed the red flags of hereditary cancer syndromes and the dominant inheritance patterns. The patient's position in the family places her at a 25% chance of having the known familial mutation.  However, with her recent diagnosis of triple negative breast cancer, it is more suggestive that she is positive for the BRCA2 mutation.  We discussed that if she is positive, that we  are determining that her mother, who has not wanted to be tested in the past, is also a carrier of this mutation.  We briefly reviewed NCCN guidelines and recommendations.   If the BRCA testing is negative, we discussed that we could be testing for the wrong gene.  We discussed gene panels, and that several cancer genes that are associated with different cancers can be tested at the same time.  Because of the different types of cancer that are in the patient's family, we will consider one of the panel tests if she is negative for BRCA mutations.   The patient's personal and family history of cancer and a known BRCA2 mutation is  suggestive of the following possible diagnosis: hereditary breast and ovarian cancer syndrome (HBOC)  We discussed that identification of a hereditary cancer syndrome may help her care providers tailor the patients medical management. If a mutation indicating HBOC is detected in this case, the [employer] recommendations would include increased cancer surveillance and possible prophylactic surgery. If a mutation is detected, the patient will be referred back to the referring provider and to any additional appropriate care providers to discuss the relevant options.   If a mutation is not found in the patient, this will decrease the likelihood of HBOC as the explanation for her breast cancer. Cancer surveillance options would be discussed for the patient according to the appropriate standard National Comprehensive Cancer Network and American Cancer Society guidelines, with consideration of their personal and family history risk factors. In this case, the patient will be referred back to their care providers for discussions of management.   After considering the risks, benefits, and limitations, the patient provided informed consent for  the following  testing: Single site BRCA2 testing, reflexing to [name] through [facility].   Per the patient's request, we will contact her by telephone to discuss these results. A follow up genetic counseling visit will be scheduled if indicated.  The patient was seen for a total of 60 minutes, greater than 50% of which was spent face-to-face counseling.  This plan is being carried out per Dr. Raymond Gurney Magrinat's recommendations.  This note will also be sent to the referring provider via the electronic medical record. The patient will be supplied with a summary of this genetic counseling discussion as well as educational information on the discussed hereditary cancer syndromes following the conclusion of their visit.   Patient was discussed  with Dr. Drue Second.   _______________________________________________________________________ For Office Staff:  Number of people involved in session: 2 Was an Intern/ student involved with case: no

## 2012-04-04 ENCOUNTER — Other Ambulatory Visit: Payer: BC Managed Care – PPO

## 2012-04-05 ENCOUNTER — Ambulatory Visit
Admission: RE | Admit: 2012-04-05 | Discharge: 2012-04-05 | Disposition: A | Discharge status: Home / Self Care | Payer: BC Managed Care – PPO | Source: Ambulatory Visit | Attending: Radiology | Admitting: Radiology

## 2012-04-05 ENCOUNTER — Encounter: Payer: Self-pay | Admitting: Oncology

## 2012-04-05 DIAGNOSIS — R928 Other abnormal and inconclusive findings on diagnostic imaging of breast: Secondary | ICD-10-CM

## 2012-04-05 MED ORDER — GADOBENATE DIMEGLUMINE 529 MG/ML IV SOLN
15.0000 mL | Freq: Once | INTRAVENOUS | Status: AC | PRN
  Administered 2012-04-05: 15 mL via INTRAVENOUS

## 2012-04-05 NOTE — Progress Notes (Signed)
No episodes or meds put in as of today 04/05/12-- Patient has [employer]BS and has attended Chemo class.

## 2012-04-09 ENCOUNTER — Encounter: Payer: Self-pay | Admitting: Genetic Counselor

## 2012-04-09 ENCOUNTER — Telehealth: Payer: Self-pay | Admitting: Genetic Counselor

## 2012-04-09 NOTE — Telephone Encounter (Signed)
Revealed BRCA2 mutation found.  Scheduled patient for 9 AM 2/25 to discuss findings.

## 2012-04-10 ENCOUNTER — Ambulatory Visit (HOSPITAL_BASED_OUTPATIENT_CLINIC_OR_DEPARTMENT_OTHER): Payer: BC Managed Care – PPO | Admitting: Genetic Counselor

## 2012-04-10 DIAGNOSIS — C50919 Malignant neoplasm of unspecified site of unspecified female breast: Secondary | ICD-10-CM

## 2012-04-10 DIAGNOSIS — Z1501 Genetic susceptibility to malignant neoplasm of breast: Secondary | ICD-10-CM

## 2012-04-10 NOTE — Progress Notes (Signed)
Norma [name], a 47 y.o. female, came in for discussion of her genetic test results that confirmed she is a carrier of the BRCA2 mutation that was found in her family. She presents to clinic today, with her sister Norma [name], to discuss the possibility of a genetic predisposition to cancer, and to further clarify her risks, as well as her family members' risks for cancer.   HISTORY OF PRESENT ILLNESS: In 2014, at the age of 16, Norma [name] was diagnosed with triple negative breast cancer. This was treated with chemotherapy and most likely a double mastectomy.    Past Medical History  Diagnosis Date  . Breast cancer     Past Surgical History  Procedure Laterality Date  . Anterior cruciate ligament repair  2010    History  Substance Use Topics  . Smoking status: Never Smoker   . Smokeless tobacco: Not on file  . Alcohol Use: Yes    FAMILY HISTORY:  We obtained a detailed, 4-generation family history.  Significant diagnoses are listed below: Family History  Problem Relation Age of Onset  . Breast cancer Maternal Aunt     dx in her 47s  . Ovarian cancer Maternal Aunt     dx in her 97s  . Esophageal cancer Maternal Uncle   . Liver cancer Maternal Uncle   . Lung cancer Maternal Uncle   . BRCA 1/2 Maternal Uncle     BRCA 2 +  . Breast cancer Cousin     dx in her 30s; BRCA2+  . Pancreatic cancer Maternal Uncle     dx in his 33s  . BRCA 1/2 Maternal Uncle     BRCA2+  . Colon cancer Maternal Uncle     Patient's maternal ancestors are of [place] descent, and paternal ancestors are of [place] and [place] descent. There is no reported Ashkenazi Jewish ancestry. There is no  known consanguinity.  GENETIC COUNSELING RISK ASSESSMENT, DISCUSSION, AND SUGGESTED FOLLOW UP: We revealed the genetic test results indicating that she carries the same BRCA2 mutation that was found in her cousin.  We reviewed the cancer risks associated with BRCA2 mutations, including an increased risk for  breast, ovarian, prostate, pancreatic and other cancers.  We discussed the screening options and risk reducing surgeries that can help manage the increased cancer risks.  Lastly, we went through her family history and reviewed who in the family should consider testing.  Her mother did not want to be tested, but since the patient's maternal uncles have tested positive for this mutation, and the patient has as well, we have essentially diagnosed her mother with the mutation.  We discussed that her mother should talk about the risks and benefits of screening vs prophylactic surgery with her GYN.  We reviewed that the patient's siblings and children are all at 50% risk for this mutation.  Her siblings may want to be tested now, but her children are under age 64 and can put off testing for the future.  The patient was seen for a total of 30 minutes, greater than 50% of which was spent face-to-face counseling.  This plan is being carried out per Dr. Darrall Dears recommendations.  This note will also be sent to the referring provider via the electronic medical record. The patient will be supplied with a summary of this genetic counseling discussion as well as educational information on the discussed hereditary cancer syndromes following the conclusion of their visit.   Patient was discussed with Dr. Drue Second.  EDUCATIONAL INFORMATION  SUPPLIED TO PATIENT AT ENCOUNTER:  Copy of genetic test results Brochure about hereditary breast and ovarian cancer Facing Our risk support group information NCCN guidelines for screening    _______________________________________________________________________ For Office Staff:  Number of people involved in session: 3 Was an Intern/ student involved with case: no }

## 2012-04-12 ENCOUNTER — Other Ambulatory Visit: Payer: Self-pay | Admitting: Oncology

## 2012-04-13 ENCOUNTER — Telehealth (INDEPENDENT_AMBULATORY_CARE_PROVIDER_SITE_OTHER): Payer: Self-pay | Admitting: General Surgery

## 2012-04-13 ENCOUNTER — Ambulatory Visit (HOSPITAL_BASED_OUTPATIENT_CLINIC_OR_DEPARTMENT_OTHER): Payer: BC Managed Care – PPO | Admitting: Oncology

## 2012-04-13 VITALS — BP 124/85 | HR 68 | Temp 98.2°F | Resp 20 | Ht 65.75 in | Wt 166.9 lb

## 2012-04-13 DIAGNOSIS — C50919 Malignant neoplasm of unspecified site of unspecified female breast: Secondary | ICD-10-CM

## 2012-04-13 DIAGNOSIS — Z171 Estrogen receptor negative status [ER-]: Secondary | ICD-10-CM

## 2012-04-13 NOTE — Progress Notes (Signed)
ID: Norma [name]   DOB: 08-07-1965  MR#: [id number]  OZH#:[id number]  PCP: Norma [name] GYN: Norma [name] SU: Norma [name] OTHER MD: Norma [name], Norma [name], [name]   HISTORY OF PRESENT ILLNESS: Norma [name] had routine screening mammography at [facility] 11/16/2010 suggesting additional imaging on the right. This was performed the next day, and found no significant residual abnormality. Repeat screening mammography 03/15/2012 again suggested an area of architectural distortion in the right breast. In the same quadrant as before. Additional views 03/19/2012 found a 3 mm hypoechoic mass in the area in question. Biopsy of this mass 03/20/2012 showed an invasive ductal carcinoma, grade 1-2, triple negative, with an MIB-1 of 100%.  Breast MRI 03/23/2012 showed an area of poorly defined enhancement in the right breast associated with clip artifact. This measured 1 cm. In the left breast there was an enhancing mass measuring 2.0 cm. There were no other suspicious findings. The patient's subsequent history is as detailed below.  INTERVAL HISTORY: Norma [name] returns today accompanied by her husband Norma [name] and a mutual friend, to discuss results of her BRCA testing, which showed her to be BRCA 2 positive  REVIEW OF SYSTEMS: She is anxious and somewhat depressed at the genetic finding, but she has no new physical symptoms and a detailed review of systems today continues to be entirely negative.  PAST MEDICAL HISTORY: Past Medical History  Diagnosis Date  . Breast cancer     PAST SURGICAL HISTORY: Past Surgical History  Procedure Laterality Date  . Anterior cruciate ligament repair  2010  Moh's surgery for basal cell, Left nares  FAMILY HISTORY Family History  Problem Relation Age of Onset  . Breast cancer Maternal Aunt     dx in her 34s  . Ovarian cancer Maternal Aunt     dx in her 14s  . Esophageal cancer Maternal Uncle   . Liver cancer Maternal Uncle   . Lung cancer Maternal Uncle   . BRCA  1/2 Maternal Uncle     BRCA 2 +  . Breast cancer Cousin     dx in her 30s; BRCA2+  . Pancreatic cancer Maternal Uncle     dx in his 11s  . BRCA 1/2 Maternal Uncle     BRCA2+  . Colon cancer Maternal Uncle    the patient's parents are alive, in their early 46s. The patient has one brother and one sister. There is a significant family history of cancer and this includes one of the patient's mother is 2 sisters with ovarian and breast cancer. A first cousin of the patient had breast cancer in her 30s. She has been tested for the BRCA gene, but the patient does not know the results. There were also brothers of the patient's mother with esophageal pancreas and colon cancer. The patient has been scheduled for genetic testing.  GYNECOLOGIC HISTORY: Menarche age 41, first live birth age 81, she is GX P2. The patient's periods are increase in bili scans and irregular, but still ongoing. She did take birth control pills for approximately 10 years with no untoward events.  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 Her husband Norma [name] is a Academic librarian. He owns his own business. He is originally from [name]. Their children are Norma [name] 17 and Norma [name] 14.   ADVANCED DIRECTIVES: Not in place. If both she and her husband become disabled and cannot make her own health care decisions, the patient intends to name her friend Norma  [name] as Public affairs consultant of attorney. Otherwise of course she and Norma [name] will be each other self care power of attorney  HEALTH MAINTENANCE: History  Substance Use Topics  . Smoking status: Never Smoker   . Smokeless tobacco: Not on file  . Alcohol Use: Yes     Colonoscopy:  PAP:  Bone density:  Lipid panel:  Allergies  Allergen Reactions  . Penicillins Nausea Only and Rash    Current Outpatient Prescriptions  Medication Sig Dispense Refill  . buPROPion (WELLBUTRIN XL) 300 MG 24 hr tablet Take 300 mg by mouth  daily.      Marland Kitchen escitalopram (LEXAPRO) 10 MG tablet Take 10 mg by mouth daily.       No current facility-administered medications for this visit.    OBJECTIVE: Middle-aged white woman who appears well Filed Vitals:   04/13/12 1006  BP: 124/85  Pulse: 68  Temp: 98.2 F (36.8 C)  Resp: 20     Body mass index is 27.15 kg/(m^2).    ECOG FS: 0  Physical exam was deferred today  LAB RESULTS: Lab Results  Component Value Date   WBC 6.1 03/28/2012   NEUTROABS 3.3 03/28/2012   HGB 13.9 03/28/2012   HCT 39.1 03/28/2012   MCV 95.6 03/28/2012   PLT 202 03/28/2012      Chemistry      Component Value Date/Time   NA 139 03/28/2012 0826   K 4.6 03/28/2012 0826   CL 105 03/28/2012 0826   CO2 26 03/28/2012 0826   BUN 12.1 03/28/2012 0826   CREATININE 0.9 03/28/2012 0826      Component Value Date/Time   CALCIUM 9.3 03/28/2012 0826   ALKPHOS 58 03/28/2012 0826   AST 26 03/28/2012 0826   ALT 23 03/28/2012 0826   BILITOT 1.06 03/28/2012 0826       Lab Results  Component Value Date   LABCA2 23 03/28/2012    No components found with this basename: [id number]    No results found for this basename: INR,  in the last 168 hours  Urinalysis No results found for this basename: colorurine,  appearanceur,  labspec,  phurine,  glucoseu,  hgbur,  bilirubinur,  ketonesur,  proteinur,  urobilinogen,  nitrite,  leukocytesur    STUDIES: Mr Breast Bilateral W Wo Contrast  03/23/2012  *RADIOLOGY REPORT*  Clinical Data: new diagnosis of invasive ductal carcinoma right 9 o'clock, for treatment planning  BILATERAL BREAST MRI WITH AND WITHOUT CONTRAST  Technique: Multiplanar, multisequence MR images of both breasts were obtained prior to and following the intravenous administration of 15ml of Multihance.  Three dimensional images were evaluated at the independent DynaCad workstation.  Comparison:  bilateral mammogram 03/15/12  Findings: There is moderate background parenchymal enhancement. There is no adenopathy and  there is no abnormal skin or nipple enhancement.  On the right, there is marker clip artifact in the posterior 9 o'clock position at the site of recent biopsy demonstrating invasive carcinoma.  The clip is located centrally within an area of poorly defined enhancement which measures about 2 x 1 x 1cm in diameter and which is mostly obscured by clip artifact.  On the left, posteriorly in the 2 o'clock position, there is an enhancing mass measuring 20 x 13 x 11mm, showing mostly persistent and plateau kinetics with a tubular macrolobulated appearnce.  It also demonstrates mild internal and surrounding T2 hyperintensity with no corresponding findings on the recent mammogram.  IMPRESSION: In addition to the know carcinoma on the right,  there is also an area of enhancement on the left in the posterior 2 o'clock position that is suspicious for possible mass.  BI-RADS CATEGORY 4:  Suspicious abnormality - biopsy should be considered.  RECOMMENDATION: A second look left ultrasound is recommended, with subsequent ultrasound-guided core biopsy if the finding is sonographically visible.  If the mass in the 2 o'clock position of the left breast is not visible by ultrasound, then MRI-guided core needle biopsy would be suggested.  THREE-DIMENSIONAL MR IMAGE RENDERING ON INDEPENDENT WORKSTATION:  Three-dimensional MR images were rendered by post-processing of the original MR data on an independent workstation.  The three- dimensional MR images were interpreted, and findings were reported in the accompanying complete MRI report for this study.   Original Report Authenticated By: Esperanza Heir, M.D.     ASSESSMENT: 47 y.o. BRCA-2 positive [facility] [name] woman status post right breast biopsy 03/20/2012 for a clinical T1b N0, stage IA invasive ductal carcinoma, grade 1-2, triple negative, with an MIB-1 of 100%  (2) biopsy of a clinical T2 N0, stage IIA left breast mass was benign  PLAN: We spent the better part of her hour-long  visit today discussing the implications of being BRCA2 positive. As far as the breasts are concerned, [facility]a [name] has the option of intensifying screening, which usually means yearly mammogram and yearly MRI of the breast. If this is done, she may safely opted for lumpectomy, without putting her survival in jeopardy. As far as the ovaries are concerned, since she is already perimenopausal, my recommendation is that whenever convenient she proceeded to bilateral salpingo-oophorectomy, or to total abdominal hysterectomy with bilateral salpingo-oophorectomy after she and her gynecologist have had ample time to discuss the pros and cons of these options.  The more immediate questions for Sena is whether she wants bilateral mastectomies or proceed to lumpectomy. If she wants bilateral mastectomies the next question is what kind of reconstruction would she want. I find that it takes patient's a significant amount of time to go through these options and become well-informed, and for that reason it may be most convenient for her to have her adjuvant chemotherapy first. This would give her 3 months to explore surgery and reconstruction possibilities.  Accordingly I have requested an appointment next week of for her with Dr. Jamey Ripa, and she will return to see me in 2 weeks or so. If she has decided to start with chemotherapy, we will go over her the anti-nausea medications at that time, and set her up for definitive treatment dates. The plan, whether she receives chemotherapy before or after surgery, will be 4 cyclophosphamide and docetaxel given every 3 weeks x4. Rishi Vicario C    04/13/2012

## 2012-04-13 NOTE — Telephone Encounter (Signed)
Left message on machine for patient to call back and ask for me. To make her aware Dr Jamey Ripa would like to see her next week. I have made a tentative appt for Thursday 04/19/2012 @ 3:40 pm. Awaiting patient's call back to confirm.

## 2012-04-13 NOTE — Telephone Encounter (Signed)
Message copied by Liliana Cline on Fri Apr 13, 2012  1:59 PM ------      Message from: Currie Paris      Created: Fri Apr 13, 2012  1:20 PM       I'll need to get her to come by to talk next week. I could do Monday in block time if needed      ----- Message -----         From: Lowella Dell, MD         Sent: 04/13/2012  11:37 AM           To: Currie Paris, MD            Norma [name] is BRCA-2 positive. She is thinking of bilateral mastectomies w reconstruction but she really does not know enough about it to make a definitive decision. She understands she can keep her breasts and we would do increased surveillance w MRI yearly and that it is safe to do that if she wants. She would like to get your input. Can you see her next week?            If she wants more time to explore her options and make a definitive plan, we can start chemo as soon as you place a port. That would give her 3 months to talk w the plastics folks, etc.            Thanks!            GM       ------

## 2012-04-16 NOTE — Telephone Encounter (Signed)
Patient made aware of appt on Thursday.

## 2012-04-19 ENCOUNTER — Ambulatory Visit (INDEPENDENT_AMBULATORY_CARE_PROVIDER_SITE_OTHER): Payer: BC Managed Care – PPO | Admitting: Surgery

## 2012-04-19 ENCOUNTER — Encounter (INDEPENDENT_AMBULATORY_CARE_PROVIDER_SITE_OTHER): Payer: Self-pay | Admitting: Surgery

## 2012-04-19 VITALS — BP 110/78 | HR 72 | Temp 97.2°F | Resp 12 | Ht 65.75 in | Wt 164.5 lb

## 2012-04-19 DIAGNOSIS — C50919 Malignant neoplasm of unspecified site of unspecified female breast: Secondary | ICD-10-CM

## 2012-04-19 DIAGNOSIS — C50911 Malignant neoplasm of unspecified site of right female breast: Secondary | ICD-10-CM

## 2012-04-19 DIAGNOSIS — Z1501 Genetic susceptibility to malignant neoplasm of breast: Secondary | ICD-10-CM

## 2012-04-19 NOTE — Patient Instructions (Signed)
We willschedule port placement for chemo and arrange a plastic surgery consultation for possible reconstruction

## 2012-04-20 DIAGNOSIS — Z1509 Genetic susceptibility to other malignant neoplasm: Secondary | ICD-10-CM | POA: Insufficient documentation

## 2012-04-20 NOTE — Progress Notes (Signed)
CC: Breast cancer, now found to be BRCA 2 positive.  HPI. This patient was recently diagnosed with a Right IDC, triple negative, clinical Stage1. She had a second area in the left breast that was suspicious on MRI, but on NCB was found to be benign. She was then tested and found to be BRCA2 + (and has a family member BRCA2+) and now comes in to discuss surgical options in view of new BRCA+ finding.  In addition she has noted a small lump in the left breast just above the NCB site that is a bit uncomfotable.  PFSH unchanged  Exam: Vital signs: BP 110/78  Pulse 72  Temp(Src) 97.2 F (36.2 C) (Oral)  Resp 12  Ht 5' 5.75" (1.67 m)  Wt 164 lb 8 oz (74.617 kg)  BMI 26.75 kg/m2 General: alert, NAD Breasts; There is a 1 cm area in the left UOQ just above the bx scar that is firm and may represent some post bx hematoma.  Imp: 1. Stage 1 IDC, right breast, Triple negative 2. BRCA2 +  Plan: Long discussion with patient. She was planning chemo, then lumpectomy, but now is considering Bilateral mastectomy and oopherectomy. She is very interested in reconstruction. She apparently will not need post mastectomy radiation. Therefoer she could have immediate reconstruction. She could still have neo-adjuvant chemo, then surgery, or could have surgery followed by chemo.   Tentatively we will set up for port placement and plastic consult. She will think about options a bit more, and if she wishes surgery first we can do that. If she does chemo first then she will have more time to think about surgery and reconstruction options.  We spent 30 minutes of this visit reviewing these issues

## 2012-04-22 DIAGNOSIS — C50919 Malignant neoplasm of unspecified site of unspecified female breast: Secondary | ICD-10-CM | POA: Insufficient documentation

## 2012-04-23 ENCOUNTER — Telehealth (INDEPENDENT_AMBULATORY_CARE_PROVIDER_SITE_OTHER): Payer: Self-pay | Admitting: General Surgery

## 2012-04-23 NOTE — Telephone Encounter (Signed)
Spoke with patient she has appt with Dr Genesis Splinter 05/22/12 1130 am

## 2012-04-24 ENCOUNTER — Telehealth (INDEPENDENT_AMBULATORY_CARE_PROVIDER_SITE_OTHER): Payer: Self-pay | Admitting: General Surgery

## 2012-04-24 NOTE — Telephone Encounter (Signed)
Patient will call back when ready

## 2012-04-24 NOTE — Telephone Encounter (Signed)
Message copied by Liliana Cline on Tue Apr 24, 2012  8:48 AM ------      Message from: Mitzi Davenport      Created: Fri Apr 20, 2012 12:11 PM      Regarding: Surgery       Per patient she has changed her mind she wants surgery first per patient she is going to see Dr. Darnelle Catalan and then she will call and set up breast surgery, just FYI            Thank you      Tiranda ------

## 2012-04-28 ENCOUNTER — Telehealth: Payer: Self-pay | Admitting: Oncology

## 2012-04-28 NOTE — Telephone Encounter (Signed)
lmonvm adviisng the pt of her march appt with dr Darnelle Catalan. Prev spot taken by tami k with another pt. S/w tami regarding this issue. gve the pt the next avail spot.

## 2012-05-03 ENCOUNTER — Ambulatory Visit (HOSPITAL_BASED_OUTPATIENT_CLINIC_OR_DEPARTMENT_OTHER): Payer: BC Managed Care – PPO | Admitting: Oncology

## 2012-05-03 VITALS — BP 119/80 | HR 89 | Temp 98.3°F | Resp 20 | Ht 65.75 in | Wt 164.1 lb

## 2012-05-03 DIAGNOSIS — Z171 Estrogen receptor negative status [ER-]: Secondary | ICD-10-CM

## 2012-05-03 DIAGNOSIS — C50419 Malignant neoplasm of upper-outer quadrant of unspecified female breast: Secondary | ICD-10-CM

## 2012-05-03 DIAGNOSIS — Z1509 Genetic susceptibility to other malignant neoplasm: Secondary | ICD-10-CM

## 2012-05-06 NOTE — Progress Notes (Signed)
ID: [name]   DOB: 09-May-1965  MR#: [id number]  WUJ#:[id number]  PCP: Selena Batten GYN: Gerald Leitz SU: Cicero Duck OTHER MD: Lurline Hare, Rogelia Mire, [facility] Danella Deis   HISTORY OF PRESENT ILLNESS: [name] had routine screening mammography at [facility] 11/16/2010 suggesting additional imaging on the right. This was performed the next day, and found no significant residual abnormality. Repeat screening mammography 03/15/2012 again suggested an area of architectural distortion in the right breast. In the same quadrant as before. Additional views 03/19/2012 found a 3 mm hypoechoic mass in the area in question. Biopsy of this mass 03/20/2012 showed an invasive ductal carcinoma, grade 1-2, triple negative, with an MIB-1 of 100%.  Breast MRI 03/23/2012 showed an area of poorly defined enhancement in the right breast associated with clip artifact. This measured 1 cm. In the left breast there was an enhancing mass measuring 2.0 cm. There were no other suspicious findings. The patient's subsequent history is as detailed below.  INTERVAL HISTORY: Olita returns today accompanied by her husband [name] for further discussion of her breast cancer and the RCA situation. Since her last visit here, Andreya has decided to undergo bilateral mastectomies with immediate reconstruction. She still has not been able to meet with a plastic surgeon, and accordingly has not yet decided what kind of reconstruction she would like. The plan at this point is to start with surgery and follow with chemotherapy.  REVIEW OF SYSTEMS: The only new symptom is trouble sleeping. She is treating this with Benadryl and it works until about 4 in the morning. After that she just cannot go back to sleep" my mind is racing". She is walking about 5 miles a day. A detailed review of systems today was otherwise noncontributory  PAST MEDICAL HISTORY: Past Medical History  Diagnosis Date  . Breast cancer     PAST SURGICAL HISTORY: Past  Surgical History  Procedure Laterality Date  . Anterior cruciate ligament repair  2010  Moh's surgery for basal cell, Left nares  FAMILY HISTORY Family History  Problem Relation Age of Onset  . Breast cancer Maternal Aunt     dx in her 20s  . Ovarian cancer Maternal Aunt     dx in her 27s  . Esophageal cancer Maternal Uncle   . Liver cancer Maternal Uncle   . Lung cancer Maternal Uncle   . BRCA 1/2 Maternal Uncle     BRCA 2 +  . Breast cancer Cousin     dx in her 30s; BRCA2+  . Pancreatic cancer Maternal Uncle     dx in his 14s  . BRCA 1/2 Maternal Uncle     BRCA2+  . Colon cancer Maternal Uncle    the patient's parents are alive, in their early 90s. The patient has one brother and one sister. There is a significant family history of cancer and this includes one of the patient's mother is 2 sisters with ovarian and breast cancer. A first cousin of the patient had breast cancer in her 30s. She has been tested for the BRCA gene, but the patient does not know the results. There were also brothers of the patient's mother with esophageal pancreas and colon cancer. The patient has been scheduled for genetic testing.  GYNECOLOGIC HISTORY: Menarche age 89, first live birth age 80, she is GX P2. The patient's periods are increase in bili scans and irregular, but still ongoing. She did take birth control pills for approximately 10 years with no untoward events.  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 Her husband [name] is a Academic librarian. He owns his own business. He is originally from [place]. Their children are [name] 17 and [name] 14.   ADVANCED DIRECTIVES: Not in place. If both she and her husband become disabled and cannot make her own health care decisions, the patient intends to name her friend [name] Scutari as healthcare power of attorney. Otherwise of course she and [name] will be each other self care power of attorney  HEALTH  MAINTENANCE: History  Substance Use Topics  . Smoking status: Never Smoker   . Smokeless tobacco: Not on file  . Alcohol Use: Yes     Colonoscopy:  PAP:  Bone density:  Lipid panel:  Allergies  Allergen Reactions  . Penicillins Nausea Only and Rash    Current Outpatient Prescriptions  Medication Sig Dispense Refill  . buPROPion (WELLBUTRIN XL) 300 MG 24 hr tablet Take 300 mg by mouth daily.      Marland Kitchen escitalopram (LEXAPRO) 10 MG tablet Take 10 mg by mouth daily.       No current facility-administered medications for this visit.    OBJECTIVE: Middle-aged white woman who appears well Filed Vitals:   05/03/12 1627  BP: 119/80  Pulse: 89  Temp: 98.3 F (36.8 C)  Resp: 20     Body mass index is 26.69 kg/(m^2).    ECOG FS: 0  Physical exam was deferred today  LAB RESULTS: Lab Results  Component Value Date   WBC 6.1 03/28/2012   NEUTROABS 3.3 03/28/2012   HGB 13.9 03/28/2012   HCT 39.1 03/28/2012   MCV 95.6 03/28/2012   PLT 202 03/28/2012      Chemistry      Component Value Date/Time   NA 139 03/28/2012 0826   K 4.6 03/28/2012 0826   CL 105 03/28/2012 0826   CO2 26 03/28/2012 0826   BUN 12.1 03/28/2012 0826   CREATININE 0.9 03/28/2012 0826      Component Value Date/Time   CALCIUM 9.3 03/28/2012 0826   ALKPHOS 58 03/28/2012 0826   AST 26 03/28/2012 0826   ALT 23 03/28/2012 0826   BILITOT 1.06 03/28/2012 0826       Lab Results  Component Value Date   LABCA2 23 03/28/2012    No components found with this basename: [id number]    No results found for this basename: INR,  in the last 168 hours  Urinalysis No results found for this basename: colorurine,  appearanceur,  labspec,  phurine,  glucoseu,  hgbur,  bilirubinur,  ketonesur,  proteinur,  urobilinogen,  nitrite,  leukocytesur    STUDIES: No results found.  ASSESSMENT: 47 y.o. BRCA-2 positive Pura Spice woman status post right breast biopsy 03/20/2012 for a clinical T1b N0, stage IA invasive ductal carcinoma,  grade 1-2, triple negative, with an MIB-1 of 100%  (2) biopsy of a clinical T2 N0, stage IIA left breast mass was benign  PLAN: [name] has decided to go for bilateral mastectomies, which I think is a very reasonable and indeed the most common decision of women in her situation. She would like to have immediate reconstruction. She has not yet met with a plastic surgeon, but that is planned for the immediate future.  She understands she will need bilateral salpingo-oophorectomy. She will arrange this through Dr. Richardson Dopp about once [name] completes her adjuvant chemotherapy.  We spent upwards of 45 minutes today discussing the various types of breast reconstruction. She  understands the main pluses and minuses of the various possibilities. In addition we discussed the fact that her husband, [name], would benefit from counseling or group support a regarding [name] is new diagnosis. I have contacted our Arlyce Dice regarding this, since I agree this is a definite need for our program to develop.  At this point the plan is for [facility] to meet with a plastic surgeon, choose the type of reconstruction she wants (at this point she is leaning toward expander/implant reconstruction), then proceed to chemotherapy, which will consist of cyclophosphamide and docetaxel x4. When she completes her chemotherapy, she will proceed to bilateral salpingo-oophorectomy. The patient is a good understanding of the above. She knows to call for any problems that may develop before her next visit here, which will be in approximately one month.        Netty Sullivant C    05/06/2012

## 2012-05-09 ENCOUNTER — Telehealth: Payer: Self-pay | Admitting: Oncology

## 2012-05-09 ENCOUNTER — Other Ambulatory Visit: Payer: Self-pay | Admitting: Oncology

## 2012-05-09 NOTE — Telephone Encounter (Signed)
LMONVM ADVISING THE PT OF HER MAY APPT

## 2012-05-10 ENCOUNTER — Other Ambulatory Visit (INDEPENDENT_AMBULATORY_CARE_PROVIDER_SITE_OTHER): Payer: Self-pay | Admitting: Surgery

## 2012-05-10 DIAGNOSIS — C50911 Malignant neoplasm of unspecified site of right female breast: Secondary | ICD-10-CM

## 2012-06-01 ENCOUNTER — Ambulatory Visit: Payer: BC Managed Care – PPO | Admitting: Oncology

## 2012-06-06 ENCOUNTER — Encounter (HOSPITAL_COMMUNITY): Payer: Self-pay

## 2012-06-06 NOTE — Pre-Procedure Instructions (Signed)
Helon Wisinski  06/06/2012   Your procedure is scheduled on: 06/14/12  Report to [facility] at 730 AM.  Call this number if you have problems the morning of surgery: [phone]   Remember:   Do not eat food or drink liquids after midnight.   Take these medicines the morning of surgery with A SIP OF WATER: wellbutrin, lexapro  STOP glucosamine-chrondroitin, multi vit    now   Do not wear jewelry, make-up or nail polish.  Do not wear lotions, powders, or perfumes. You may wear deodorant.  Do not shave 48 hours prior to surgery. Men may shave face and neck.  Do not bring valuables to the hospital.  Contacts, dentures or bridgework may not be worn into surgery.  Leave suitcase in the car. After surgery it may be brought to your room.  For patients admitted to the hospital, checkout time is 11:00 AM the day of  discharge.   Patients discharged the day of surgery will not be allowed to drive  home.  Name and phone number of your driver:   Special Instructions: Shower using CHG 2 nights before surgery and the night before surgery.  If you shower the day of surgery use CHG.  Use special wash - you have one bottle of CHG for all showers.  You should use approximately 1/3 of the bottle for each shower.   Please read over the following fact sheets that you were given: Pain Booklet, Coughing and Deep Breathing, MRSA Information and Surgical Site Infection Prevention

## 2012-06-07 ENCOUNTER — Encounter: Payer: Self-pay | Admitting: *Deleted

## 2012-06-07 ENCOUNTER — Encounter (HOSPITAL_COMMUNITY)
Admission: RE | Admit: 2012-06-07 | Discharge: 2012-06-07 | Disposition: A | Discharge status: Home / Self Care | Payer: BC Managed Care – PPO | Source: Ambulatory Visit | Attending: Surgery | Admitting: Surgery

## 2012-06-07 ENCOUNTER — Encounter (HOSPITAL_COMMUNITY): Payer: Self-pay

## 2012-06-07 HISTORY — DX: Other specified postprocedural states: Z98.890

## 2012-06-07 HISTORY — DX: Depression, unspecified: F32.A

## 2012-06-07 HISTORY — DX: Nausea with vomiting, unspecified: R11.2

## 2012-06-07 HISTORY — DX: Major depressive disorder, single episode, unspecified: F32.9

## 2012-06-07 LAB — CBC
MCH: 33.2 pg (ref 26.0–34.0)
MCHC: 35.7 g/dL (ref 30.0–36.0)
Platelets: 223 10*3/uL (ref 150–400)
RBC: 4.4 MIL/uL (ref 3.87–5.11)
RDW: 12 % (ref 11.5–15.5)

## 2012-06-07 LAB — COMPREHENSIVE METABOLIC PANEL
BUN: 14 mg/dL (ref 6–23)
Calcium: 9.9 mg/dL (ref 8.4–10.5)
Creatinine, Ser: 0.77 mg/dL (ref 0.50–1.10)
GFR calc Af Amer: >90 mL/min (ref >90)
Glucose, Bld: 93 mg/dL (ref 70–99)
Total Protein: 7.3 g/dL (ref 6.0–8.3)

## 2012-06-07 LAB — HCG, SERUM, QUALITATIVE: Preg, Serum: NEGATIVE

## 2012-06-07 NOTE — Progress Notes (Signed)
Mailed after appt letter to pt. 

## 2012-06-08 ENCOUNTER — Other Ambulatory Visit: Payer: Self-pay | Admitting: Plastic Surgery

## 2012-06-13 MED ORDER — VANCOMYCIN HCL IN DEXTROSE 1-5 GM/200ML-% IV SOLN: 1000.0000 mg | INTRAVENOUS | Status: DC

## 2012-06-14 ENCOUNTER — Ambulatory Visit (HOSPITAL_COMMUNITY): Payer: BC Managed Care – PPO | Admitting: Certified Registered Nurse Anesthetist

## 2012-06-14 ENCOUNTER — Encounter (HOSPITAL_COMMUNITY): Payer: Self-pay | Admitting: Certified Registered Nurse Anesthetist

## 2012-06-14 ENCOUNTER — Encounter (HOSPITAL_COMMUNITY)
Admission: RE | Disposition: A | Discharge status: Home / Self Care | Payer: Self-pay | Source: Ambulatory Visit | Attending: Surgery

## 2012-06-14 ENCOUNTER — Encounter (HOSPITAL_COMMUNITY): Payer: Self-pay | Admitting: *Deleted

## 2012-06-14 ENCOUNTER — Ambulatory Visit (HOSPITAL_COMMUNITY)
Admission: RE | Admit: 2012-06-14 | Discharge: 2012-06-14 | Disposition: A | Discharge status: Home / Self Care | Payer: BC Managed Care – PPO | Source: Ambulatory Visit | Attending: Surgery | Admitting: Surgery

## 2012-06-14 ENCOUNTER — Inpatient Hospital Stay (HOSPITAL_COMMUNITY)
Admission: RE | Admit: 2012-06-14 | Discharge: 2012-06-16 | DRG: 258 | Disposition: A | Discharge status: Home / Self Care | Payer: BC Managed Care – PPO | Source: Ambulatory Visit | Attending: Surgery | Admitting: Surgery

## 2012-06-14 DIAGNOSIS — Z01812 Encounter for preprocedural laboratory examination: Secondary | ICD-10-CM

## 2012-06-14 DIAGNOSIS — Z4001 Encounter for prophylactic removal of breast: Secondary | ICD-10-CM

## 2012-06-14 DIAGNOSIS — C50919 Malignant neoplasm of unspecified site of unspecified female breast: Secondary | ICD-10-CM

## 2012-06-14 DIAGNOSIS — Z79899 Other long term (current) drug therapy: Secondary | ICD-10-CM

## 2012-06-14 DIAGNOSIS — C50911 Malignant neoplasm of unspecified site of right female breast: Secondary | ICD-10-CM

## 2012-06-14 DIAGNOSIS — F3289 Other specified depressive episodes: Secondary | ICD-10-CM | POA: Diagnosis present

## 2012-06-14 DIAGNOSIS — Z853 Personal history of malignant neoplasm of breast: Secondary | ICD-10-CM

## 2012-06-14 DIAGNOSIS — F329 Major depressive disorder, single episode, unspecified: Secondary | ICD-10-CM | POA: Diagnosis present

## 2012-06-14 DIAGNOSIS — C50419 Malignant neoplasm of upper-outer quadrant of unspecified female breast: Secondary | ICD-10-CM | POA: Diagnosis present

## 2012-06-14 HISTORY — PX: BREAST RECONSTRUCTION WITH PLACEMENT OF TISSUE EXPANDER AND FLEX HD (ACELLULAR HYDRATED DERMIS): SHX6295

## 2012-06-14 HISTORY — PX: TOTAL MASTECTOMY: SHX6129

## 2012-06-14 HISTORY — PX: SIMPLE MASTECTOMY WITH AXILLARY SENTINEL NODE BIOPSY: SHX6098

## 2012-06-14 SURGERY — SIMPLE MASTECTOMY WITH AXILLARY SENTINEL NODE BIOPSY
Anesthesia: General | Site: Breast | Laterality: Right | Wound class: Clean

## 2012-06-14 MED ORDER — HYDROMORPHONE 0.3 MG/ML IV SOLN
INTRAVENOUS | Status: DC
  Administered 2012-06-15: 01:00:00 via INTRAVENOUS
  Administered 2012-06-15 (×2): 2.59 mg via INTRAVENOUS
  Filled 2012-06-14: qty 25

## 2012-06-14 MED ORDER — BUPIVACAINE HCL (PF) 0.25 % IJ SOLN
INTRAMUSCULAR | Status: AC
  Filled 2012-06-14: qty 60

## 2012-06-14 MED ORDER — MIDAZOLAM HCL 2 MG/2ML IJ SOLN
INTRAMUSCULAR | Status: AC
  Filled 2012-06-14: qty 2

## 2012-06-14 MED ORDER — ACETAMINOPHEN 10 MG/ML IV SOLN
INTRAVENOUS | Status: AC
  Filled 2012-06-14: qty 100

## 2012-06-14 MED ORDER — BUPIVACAINE HCL (PF) 0.25 % IJ SOLN
INTRAMUSCULAR | Status: AC
  Filled 2012-06-14: qty 20

## 2012-06-14 MED ORDER — DEXAMETHASONE SODIUM PHOSPHATE 4 MG/ML IJ SOLN
INTRAMUSCULAR | Status: DC | PRN
  Administered 2012-06-14: 4 mg via INTRAVENOUS

## 2012-06-14 MED ORDER — ONDANSETRON HCL 4 MG/2ML IJ SOLN
INTRAMUSCULAR | Status: DC | PRN
  Administered 2012-06-14: 4 mg via INTRAVENOUS

## 2012-06-14 MED ORDER — NALOXONE HCL 0.4 MG/ML IJ SOLN: 0.4000 mg | INTRAMUSCULAR | Status: DC | PRN

## 2012-06-14 MED ORDER — MORPHINE SULFATE 10 MG/ML IJ SOLN
INTRAMUSCULAR | Status: DC | PRN
  Administered 2012-06-14 (×5): 2 mg via INTRAVENOUS

## 2012-06-14 MED ORDER — TECHNETIUM TC 99M SULFUR COLLOID FILTERED
1.0000 | Freq: Once | INTRAVENOUS | Status: AC | PRN
  Administered 2012-06-14: 1 via INTRADERMAL

## 2012-06-14 MED ORDER — VANCOMYCIN HCL IN DEXTROSE 1-5 GM/200ML-% IV SOLN
1000.0000 mg | Freq: Two times a day (BID) | INTRAVENOUS | Status: DC
  Administered 2012-06-14 – 2012-06-16 (×4): 1000 mg via INTRAVENOUS
  Filled 2012-06-14 (×5): qty 200

## 2012-06-14 MED ORDER — PHENYLEPHRINE HCL 10 MG/ML IJ SOLN
INTRAMUSCULAR | Status: DC | PRN
  Administered 2012-06-14 (×3): 80 ug via INTRAVENOUS

## 2012-06-14 MED ORDER — LIDOCAINE HCL 4 % MT SOLN
OROMUCOSAL | Status: DC | PRN
  Administered 2012-06-14: 4 mL via TOPICAL

## 2012-06-14 MED ORDER — 0.9 % SODIUM CHLORIDE (POUR BTL) OPTIME
TOPICAL | Status: DC | PRN
  Administered 2012-06-14: 1000 mL

## 2012-06-14 MED ORDER — ACETAMINOPHEN 10 MG/ML IV SOLN
INTRAVENOUS | Status: DC | PRN
  Administered 2012-06-14: 1000 mg via INTRAVENOUS

## 2012-06-14 MED ORDER — GLYCOPYRROLATE 0.2 MG/ML IJ SOLN
INTRAMUSCULAR | Status: DC | PRN
  Administered 2012-06-14: 0.6 mg via INTRAVENOUS

## 2012-06-14 MED ORDER — OXYCODONE HCL 5 MG PO TABS
ORAL_TABLET | ORAL | Status: AC
  Filled 2012-06-14: qty 1

## 2012-06-14 MED ORDER — DOCUSATE SODIUM 100 MG PO CAPS
100.0000 mg | ORAL_CAPSULE | Freq: Every day | ORAL | Status: DC
  Administered 2012-06-15 – 2012-06-16 (×2): 100 mg via ORAL
  Filled 2012-06-14 (×2): qty 1

## 2012-06-14 MED ORDER — SODIUM CHLORIDE 0.9 % IR SOLN
Status: DC | PRN
  Administered 2012-06-14 (×2)

## 2012-06-14 MED ORDER — FENTANYL CITRATE 0.05 MG/ML IJ SOLN: 50.0000 ug | INTRAMUSCULAR | Status: DC | PRN

## 2012-06-14 MED ORDER — SODIUM CHLORIDE 0.9 % IR SOLN
Status: DC | PRN
  Administered 2012-06-14: 500 mL

## 2012-06-14 MED ORDER — ESCITALOPRAM OXALATE 10 MG PO TABS
10.0000 mg | ORAL_TABLET | Freq: Every day | ORAL | Status: DC
  Administered 2012-06-15 – 2012-06-16 (×2): 10 mg via ORAL
  Filled 2012-06-14 (×2): qty 1

## 2012-06-14 MED ORDER — LIDOCAINE HCL (CARDIAC) 20 MG/ML IV SOLN
INTRAVENOUS | Status: DC | PRN
  Administered 2012-06-14: 80 mg via INTRAVENOUS

## 2012-06-14 MED ORDER — BUPROPION HCL ER (XL) 300 MG PO TB24
300.0000 mg | ORAL_TABLET | Freq: Every day | ORAL | Status: DC
  Administered 2012-06-15 – 2012-06-16 (×2): 300 mg via ORAL
  Filled 2012-06-14 (×2): qty 1

## 2012-06-14 MED ORDER — MIDAZOLAM HCL 2 MG/2ML IJ SOLN: 1.0000 mg | INTRAMUSCULAR | Status: DC | PRN

## 2012-06-14 MED ORDER — HYDROMORPHONE HCL PF 1 MG/ML IJ SOLN
INTRAMUSCULAR | Status: AC
  Filled 2012-06-14: qty 1

## 2012-06-14 MED ORDER — HYDROMORPHONE 0.3 MG/ML IV SOLN
INTRAVENOUS | Status: AC
  Administered 2012-06-14: 17:00:00
  Filled 2012-06-14: qty 25

## 2012-06-14 MED ORDER — SODIUM CHLORIDE 0.9 % IJ SOLN: 9.0000 mL | INTRAMUSCULAR | Status: DC | PRN

## 2012-06-14 MED ORDER — CHLORHEXIDINE GLUCONATE 4 % EX LIQD: 1.0000 "application " | Freq: Once | CUTANEOUS | Status: DC

## 2012-06-14 MED ORDER — INDOCYANINE GREEN 25 MG IV SOLR
INTRAVENOUS | Status: DC | PRN
  Administered 2012-06-14: 7.5 mg via INTRAVENOUS

## 2012-06-14 MED ORDER — SODIUM CHLORIDE 0.9 % IJ SOLN
INTRAMUSCULAR | Status: DC | PRN
  Administered 2012-06-14: 60 mL via INTRAVENOUS

## 2012-06-14 MED ORDER — EPHEDRINE SULFATE 50 MG/ML IJ SOLN
INTRAMUSCULAR | Status: DC | PRN
  Administered 2012-06-14: 10 mg via INTRAVENOUS

## 2012-06-14 MED ORDER — DEXTROSE-NACL 5-0.45 % IV SOLN
INTRAVENOUS | Status: DC
  Administered 2012-06-14 – 2012-06-15 (×2): via INTRAVENOUS

## 2012-06-14 MED ORDER — OXYCODONE HCL 5 MG PO TABS
5.0000 mg | ORAL_TABLET | Freq: Once | ORAL | Status: AC | PRN
  Administered 2012-06-14: 5 mg via ORAL

## 2012-06-14 MED ORDER — LACTATED RINGERS IV SOLN
INTRAVENOUS | Status: DC | PRN
  Administered 2012-06-14 (×3): via INTRAVENOUS

## 2012-06-14 MED ORDER — SODIUM CHLORIDE 0.9 % IV SOLN
INTRAVENOUS | Status: DC | PRN
  Administered 2012-06-14: 12:00:00 via INTRAVENOUS

## 2012-06-14 MED ORDER — MIDAZOLAM HCL 5 MG/5ML IJ SOLN
INTRAMUSCULAR | Status: DC | PRN
  Administered 2012-06-14: 2 mg via INTRAVENOUS

## 2012-06-14 MED ORDER — ONDANSETRON HCL 4 MG/2ML IJ SOLN: 4.0000 mg | Freq: Four times a day (QID) | INTRAMUSCULAR | Status: DC | PRN

## 2012-06-14 MED ORDER — FENTANYL CITRATE 0.05 MG/ML IJ SOLN
INTRAMUSCULAR | Status: DC | PRN
  Administered 2012-06-14: 100 ug via INTRAVENOUS
  Administered 2012-06-14: 150 ug via INTRAVENOUS
  Administered 2012-06-14: 50 ug via INTRAVENOUS
  Administered 2012-06-14 (×3): 100 ug via INTRAVENOUS

## 2012-06-14 MED ORDER — OXYCODONE HCL 5 MG/5ML PO SOLN: 5.0000 mg | Freq: Once | ORAL | Status: AC | PRN

## 2012-06-14 MED ORDER — FENTANYL CITRATE 0.05 MG/ML IJ SOLN
INTRAMUSCULAR | Status: AC
  Filled 2012-06-14: qty 2

## 2012-06-14 MED ORDER — METHOCARBAMOL 500 MG PO TABS
500.0000 mg | ORAL_TABLET | Freq: Four times a day (QID) | ORAL | Status: DC
  Administered 2012-06-14 – 2012-06-16 (×7): 500 mg via ORAL
  Filled 2012-06-14 (×10): qty 1

## 2012-06-14 MED ORDER — LACTATED RINGERS IV SOLN: INTRAVENOUS | Status: DC

## 2012-06-14 MED ORDER — ARTIFICIAL TEARS OP OINT
TOPICAL_OINTMENT | OPHTHALMIC | Status: DC | PRN
  Administered 2012-06-14: 1 via OPHTHALMIC

## 2012-06-14 MED ORDER — NEOSTIGMINE METHYLSULFATE 1 MG/ML IJ SOLN
INTRAMUSCULAR | Status: DC | PRN
  Administered 2012-06-14: 4 mg via INTRAVENOUS

## 2012-06-14 MED ORDER — METHOCARBAMOL 500 MG PO TABS
ORAL_TABLET | ORAL | Status: AC
  Filled 2012-06-14: qty 1

## 2012-06-14 MED ORDER — DIPHENHYDRAMINE HCL 12.5 MG/5ML PO ELIX: 12.5000 mg | ORAL_SOLUTION | Freq: Four times a day (QID) | ORAL | Status: DC | PRN

## 2012-06-14 MED ORDER — PROPOFOL 10 MG/ML IV BOLUS
INTRAVENOUS | Status: DC | PRN
  Administered 2012-06-14: 190 mg via INTRAVENOUS

## 2012-06-14 MED ORDER — PROMETHAZINE HCL 25 MG/ML IJ SOLN: 6.2500 mg | INTRAMUSCULAR | Status: DC | PRN

## 2012-06-14 MED ORDER — DIPHENHYDRAMINE HCL 50 MG/ML IJ SOLN: 12.5000 mg | Freq: Four times a day (QID) | INTRAMUSCULAR | Status: DC | PRN

## 2012-06-14 MED ORDER — METHYLENE BLUE 1 % INJ SOLN
INTRAMUSCULAR | Status: AC
  Filled 2012-06-14: qty 10

## 2012-06-14 MED ORDER — BUPIVACAINE HCL 0.25 % IJ SOLN
INTRAMUSCULAR | Status: DC | PRN
  Administered 2012-06-14: 40 mL

## 2012-06-14 MED ORDER — VANCOMYCIN HCL IN DEXTROSE 1-5 GM/200ML-% IV SOLN
1000.0000 mg | INTRAVENOUS | Status: AC
  Administered 2012-06-14: 1000 mg via INTRAVENOUS
  Filled 2012-06-14: qty 200

## 2012-06-14 MED ORDER — HYDROMORPHONE HCL PF 1 MG/ML IJ SOLN
0.2500 mg | INTRAMUSCULAR | Status: DC | PRN
  Administered 2012-06-14 (×4): 0.5 mg via INTRAVENOUS

## 2012-06-14 MED ORDER — SCOPOLAMINE 1 MG/3DAYS TD PT72
MEDICATED_PATCH | TRANSDERMAL | Status: AC
  Administered 2012-06-14: 1 via TRANSDERMAL
  Filled 2012-06-14: qty 1

## 2012-06-14 MED ORDER — HEPARIN SODIUM (PORCINE) 5000 UNIT/ML IJ SOLN
INTRAMUSCULAR | Status: AC
  Filled 2012-06-14: qty 1

## 2012-06-14 MED ORDER — SODIUM CHLORIDE 0.9 % IJ SOLN
INTRAMUSCULAR | Status: DC | PRN
  Administered 2012-06-14: 11:00:00 via SUBCUTANEOUS

## 2012-06-14 MED ORDER — ROCURONIUM BROMIDE 100 MG/10ML IV SOLN
INTRAVENOUS | Status: DC | PRN
  Administered 2012-06-14 (×5): 10 mg via INTRAVENOUS
  Administered 2012-06-14: 20 mg via INTRAVENOUS
  Administered 2012-06-14: 50 mg via INTRAVENOUS
  Administered 2012-06-14: 20 mg via INTRAVENOUS
  Administered 2012-06-14 (×3): 10 mg via INTRAVENOUS

## 2012-06-14 SURGICAL SUPPLY — 79 items
ADH SKN CLS APL DERMABOND .7 (GAUZE/BANDAGES/DRESSINGS) ×6
APPLIER CLIP 9.375 MED OPEN (MISCELLANEOUS) ×4
APPLIER CLIP 9.375 SM OPEN (CLIP)
APR CLP MED 9.3 20 MLT OPN (MISCELLANEOUS) ×3
APR CLP SM 9.3 20 MLT OPN (CLIP)
ATCH SMKEVC FLXB CAUT HNDSWH (FILTER) ×3 IMPLANT
BAG DECANTER FOR FLEXI CONT (MISCELLANEOUS) ×4 IMPLANT
BINDER BREAST LRG (GAUZE/BANDAGES/DRESSINGS) IMPLANT
BINDER BREAST XLRG (GAUZE/BANDAGES/DRESSINGS) IMPLANT
BIOPATCH RED 1 DISK 7.0 (GAUZE/BANDAGES/DRESSINGS) ×12 IMPLANT
CANISTER SUCTION 2500CC (MISCELLANEOUS) ×8 IMPLANT
CHLORAPREP W/TINT 26ML (MISCELLANEOUS) ×8 IMPLANT
CLIP APPLIE 9.375 MED OPEN (MISCELLANEOUS) ×3 IMPLANT
CLIP APPLIE 9.375 SM OPEN (CLIP) IMPLANT
CLOTH BEACON ORANGE TIMEOUT ST (SAFETY) ×8 IMPLANT
CONT SPEC 4OZ CLIKSEAL STRL BL (MISCELLANEOUS) ×4 IMPLANT
COVER PROBE W GEL 5X96 (DRAPES) ×4 IMPLANT
COVER SURGICAL LIGHT HANDLE (MISCELLANEOUS) ×8 IMPLANT
DERMABOND ADVANCED (GAUZE/BANDAGES/DRESSINGS) ×2
DERMABOND ADVANCED .7 DNX12 (GAUZE/BANDAGES/DRESSINGS) ×6 IMPLANT
DRAIN CHANNEL 19F RND (DRAIN) ×12 IMPLANT
DRAPE CHEST BREAST 15X10 FENES (DRAPES) ×4 IMPLANT
DRAPE ORTHO SPLIT 77X108 STRL (DRAPES) ×8
DRAPE PROXIMA HALF (DRAPES) ×16 IMPLANT
DRAPE SURG 17X23 STRL (DRAPES) ×24 IMPLANT
DRAPE SURG ORHT 6 SPLT 77X108 (DRAPES) ×6 IMPLANT
DRAPE UTILITY 15X26 W/TAPE STR (DRAPE) ×8 IMPLANT
DRAPE WARM FLUID 44X44 (DRAPE) ×4 IMPLANT
DRSG PAD ABDOMINAL 8X10 ST (GAUZE/BANDAGES/DRESSINGS) ×16 IMPLANT
DRSG TEGADERM 4X4.75 (GAUZE/BANDAGES/DRESSINGS) ×8 IMPLANT
ELECT BLADE 4.0 EZ CLEAN MEGAD (MISCELLANEOUS) ×4
ELECT BLADE 6.5 EXT (BLADE) IMPLANT
ELECT CAUTERY BLADE 6.4 (BLADE) ×8 IMPLANT
ELECT REM PT RETURN 9FT ADLT (ELECTROSURGICAL) ×8
ELECTRODE BLDE 4.0 EZ CLN MEGD (MISCELLANEOUS) ×3 IMPLANT
ELECTRODE REM PT RTRN 9FT ADLT (ELECTROSURGICAL) ×6 IMPLANT
EVACUATOR SILICONE 100CC (DRAIN) ×12 IMPLANT
EVACUATOR SMOKE ACCUVAC VALLEY (FILTER) ×1
GLOVE BIO SURGEON STRL SZ7.5 (GLOVE) ×4 IMPLANT
GLOVE BIO SURGEON STRL SZ8 (GLOVE) ×1 IMPLANT
GLOVE BIOGEL PI IND STRL 8 (GLOVE) ×3 IMPLANT
GLOVE BIOGEL PI INDICATOR 8 (GLOVE) ×2
GLOVE EUDERMIC 7 POWDERFREE (GLOVE) ×4 IMPLANT
GOWN BRE IMP SLV AUR XL STRL (GOWN DISPOSABLE) ×1 IMPLANT
GOWN PREVENTION PLUS XLARGE (GOWN DISPOSABLE) ×8 IMPLANT
GOWN STRL NON-REIN LRG LVL3 (GOWN DISPOSABLE) ×12 IMPLANT
GRAFT FLEX HD 4X16 THICK (Tissue Mesh) ×2 IMPLANT
KIT BASIN OR (CUSTOM PROCEDURE TRAY) ×8 IMPLANT
KIT DISPOSABLE SPY ELITE (KITS) ×2 IMPLANT
KIT ROOM TURNOVER OR (KITS) ×8 IMPLANT
MARKER SKIN DUAL TIP RULER LAB (MISCELLANEOUS) ×4 IMPLANT
NDL 18GX1X1/2 (RX/OR ONLY) (NEEDLE) ×3 IMPLANT
NDL HYPO 25GX1X1/2 BEV (NEEDLE) ×3 IMPLANT
NDL SPNL 22GX3.5 QUINCKE BK (NEEDLE) ×3 IMPLANT
NEEDLE 18GX1X1/2 (RX/OR ONLY) (NEEDLE) ×4 IMPLANT
NEEDLE HYPO 25GX1X1/2 BEV (NEEDLE) ×4 IMPLANT
NEEDLE SPNL 22GX3.5 QUINCKE BK (NEEDLE) ×4 IMPLANT
NS IRRIG 1000ML POUR BTL (IV SOLUTION) ×12 IMPLANT
PACK GENERAL/GYN (CUSTOM PROCEDURE TRAY) ×8 IMPLANT
PAD ARMBOARD 7.5X6 YLW CONV (MISCELLANEOUS) ×8 IMPLANT
PREFILTER EVAC NS 1 1/3-3/8IN (MISCELLANEOUS) ×4 IMPLANT
SPECIMEN JAR X LARGE (MISCELLANEOUS) ×4 IMPLANT
SPONGE GAUZE 4X4 12PLY (GAUZE/BANDAGES/DRESSINGS) ×4 IMPLANT
SPONGE LAP 18X18 X RAY DECT (DISPOSABLE) ×1 IMPLANT
SPONGE LAP 4X18 X RAY DECT (DISPOSABLE) ×4 IMPLANT
STAPLER VISISTAT 35W (STAPLE) ×4 IMPLANT
SUT ETHILON 2 0 FS 18 (SUTURE) ×4 IMPLANT
SUT MNCRL AB 3-0 PS2 18 (SUTURE) ×20 IMPLANT
SUT PDS AB 3-0 SH 27 (SUTURE) ×10 IMPLANT
SUT PROLENE 3 0 PS 2 (SUTURE) ×8 IMPLANT
SUT VIC AB 3-0 SH 18 (SUTURE) ×8 IMPLANT
SYR 50ML LL SCALE MARK (SYRINGE) ×4 IMPLANT
SYR BULB IRRIGATION 50ML (SYRINGE) ×6 IMPLANT
SYR CONTROL 10ML LL (SYRINGE) ×5 IMPLANT
TOWEL OR 17X24 6PK STRL BLUE (TOWEL DISPOSABLE) ×8 IMPLANT
TOWEL OR 17X26 10 PK STRL BLUE (TOWEL DISPOSABLE) ×8 IMPLANT
TRAY FOLEY CATH 14FRSI W/METER (CATHETERS) ×1 IMPLANT
TUBE CONNECTING 12X1/4 (SUCTIONS) ×4 IMPLANT
cpx 4 medium height breast tissue expander texture ×2 IMPLANT

## 2012-06-14 NOTE — Anesthesia Preprocedure Evaluation (Signed)
Anesthesia Evaluation  Patient identified by MRN, date of birth, ID band Patient awake    Reviewed: Allergy & Precautions, H&P , NPO status , Patient's Chart, lab work & pertinent test results  History of Anesthesia Complications (+) PONV  Airway Mallampati: II  Neck ROM: full    Dental   Pulmonary          Cardiovascular     Neuro/Psych Depression    GI/Hepatic   Endo/Other    Renal/GU      Musculoskeletal   Abdominal   Peds  Hematology   Anesthesia Other Findings   Reproductive/Obstetrics                           Anesthesia Physical Anesthesia Plan  ASA: II  Anesthesia Plan: General   Post-op Pain Management:    Induction: Intravenous  Airway Management Planned: Oral ETT  Additional Equipment:   Intra-op Plan:   Post-operative Plan: Extubation in OR  Informed Consent: I have reviewed the patients History and Physical, chart, labs and discussed the procedure including the risks, benefits and alternatives for the proposed anesthesia with the patient or authorized representative who has indicated his/her understanding and acceptance.     Plan Discussed with: CRNA, Anesthesiologist and Surgeon  Anesthesia Plan Comments:         Anesthesia Quick Evaluation  

## 2012-06-14 NOTE — Transfer of Care (Signed)
Immediate Anesthesia Transfer of Care Note  Patient: Norma [name]  Procedure(s) Performed: Procedure(s): SIMPLE MASTECTOMY WITH AXILLARY SENTINEL NODE BIOPSY (Right) TOTAL MASTECTOMY (Left) BILATERAL BREAST RECONSTRUCTION WITH PLACEMENT OF BILATERAL TISSUE EXPANDERS (Bilateral)  Patient Location: PACU  Anesthesia Type:General  Level of Consciousness: awake, alert  and oriented  Airway & Oxygen Therapy: Patient Spontanous Breathing and Patient connected to nasal cannula oxygen  Post-op Assessment: Report given to PACU RN, Post -op Vital signs reviewed and stable and Patient moving all extremities  Post vital signs: Reviewed and stable  Complications: No apparent anesthesia complications

## 2012-06-14 NOTE — Op Note (Signed)
Norma [name] 1965-05-22 [id number] 05/11/2012  Preoperative diagnosis: invasive ductal carcinoma, right breast, upper outer quadrant, clinical stage I; BRCA2 +  Postoperative diagnosis: same  Procedure: right total mastectomy with blue dye injection and axillary sentinel lymph node biopsy (2 lymph nodes removed); left total mastectomy.  Surgeon: Currie Paris, MD, FACS  Anesthesia: General   Clinical History and Indications: this patient presented a few months ago with a newly diagnosed right breast cancer. After further evaluation she was also found to be BRCA2 positive. In discussion with myself, medical oncology, plastic surgery, she elected a right total mastectomy with sentinel lymph node excision and left prophylactic mastectomy.    Description of Procedure: I saw the patient in the preoperative area, confirmed and plans with her, and marked her right breast as the side for the sentinel lymph node.she then taken to the operating room. After satisfactory general anesthesia was obtained a Foley catheter was placed and a timeout was done. I then injected 5 cc of dilute methylene blue around the areolar area on the right and massaged in. A full prep and drape was then done.  I outlined an elliptical incision on the left side and made the skin incision. I infiltrated about 50 cc of Marcaine diluted with saline into the skin flaps.I did raise the usual flaps with cautery going to the sternum, clavicle, latissimus, and inframammary fold.the breast was then removed from the chest wall from medial to lateral again using the cautery. I avoided entry into the axilla. The breast was marked for orientation with sutures and sent to pathology. I irrigated made sure everything was dry. I put moist laparotomy pads soaked in antibiotic solution covered the wound.  I turned my attention to the right side. I used a neoprobe identified a hot area in the axilla and marked the overlying skin. I outlined an  elliptical incision, made skin flaps, and injected the Marcaine/saline solution as on the left. I then raises appear flat and went out into the axilla. Using the neoprobe I found a hot blue small lymph node which was removed and sent for touch preps. A second adjacent hot node was found and removed. There were no other palpably abnormal lymph nodes, no blue lymph nodes, and no other areas of radioactivity identified in the axilla. I then made the inferior flap similar to the left side and remove the breast taking the fascia starting medially and going laterally. No further entry was made into the axilla. I then irrigated and made sure Everything was dry and placed packs with antibiotic solution for the plastic surgeon to come and do the Reconstruction with tissue expanders. The patient tolerated my portion procedure well. Estimated blood loss was about 100 cc. There are no complications.   Currie Paris, MD, FACS 06/14/2012 11:36 AM

## 2012-06-14 NOTE — Brief Op Note (Signed)
06/14/2012  3:13 PM  PATIENT:  [name]  47 y.o. female  PRE-OPERATIVE DIAGNOSIS:  right breast cancer  POST-OPERATIVE DIAGNOSIS:  right breast cancer  PROCEDURE:  Procedure(s): SIMPLE MASTECTOMY WITH AXILLARY SENTINEL NODE BIOPSY (Right) TOTAL MASTECTOMY (Left) BILATERAL BREAST RECONSTRUCTION WITH PLACEMENT OF BILATERAL TISSUE EXPANDERS (Bilateral)  SURGEON:  Surgeon(s) and Role: Panel 1:    * Christian Leta Jungling, MD - Primary  Panel 2:    * Etter Sjogren, MD - Primary  PHYSICIAN ASSISTANT:   ASSISTANTS: Jasmine December (RNFA)   ANESTHESIA:   general  EBL:  Total I/O In: 3000 [I.V.:3000] Out: 425 [Urine:325; Blood:100]  BLOOD ADMINISTERED:none  DRAINS: (4) Jackson-Pratt drain(s) with closed bulb suction in the right (2) and left (2) chest   LOCAL MEDICATIONS USED:  NONE  SPECIMEN:  No Specimen  DISPOSITION OF SPECIMEN:  N/A  COUNTS:  YES  TOURNIQUET:  * No tourniquets in log *  DICTATION: .Other Dictation: Dictation Number [id number]  PLAN OF CARE: Admit to inpatient   PATIENT DISPOSITION:  PACU - hemodynamically stable.   Delay start of Pharmacological VTE agent (>24hrs) due to surgical blood loss or risk of bleeding: no

## 2012-06-14 NOTE — Anesthesia Procedure Notes (Signed)
Procedure Name: Intubation Date/Time: 06/14/2012 9:46 AM Performed by: Orvilla Fus A Pre-anesthesia Checklist: Patient identified, Timeout performed, Suction available, Emergency Drugs available and Patient being monitored Patient Re-evaluated:Patient Re-evaluated prior to inductionOxygen Delivery Method: Circle system utilized Preoxygenation: Pre-oxygenation with 100% oxygen Intubation Type: IV induction Ventilation: Mask ventilation without difficulty and Oral airway inserted - appropriate to patient size Laryngoscope Size: Mac and 4 Grade View: Grade I Tube type: Oral Tube size: 7.0 mm Number of attempts: 2 (DLx1 by paramedic student Boyd Kerbs, DLx1 by CRNA) Airway Equipment and Method: Stylet and LTA kit utilized Placement Confirmation: ETT inserted through vocal cords under direct vision,  breath sounds checked- equal and bilateral and positive ETCO2 Secured at: 21 cm Tube secured with: Tape Dental Injury: Teeth and Oropharynx as per pre-operative assessment

## 2012-06-14 NOTE — Preoperative (Signed)
Beta Blockers   Reason not to administer Beta Blockers:Not Applicable 

## 2012-06-14 NOTE — Progress Notes (Signed)
ANTIBIOTIC CONSULT NOTE - INITIAL  Pharmacy Consult for Vancomycin Indication: post-op prophylaxis  Allergies  Allergen Reactions  . Adhesive (Tape)     Pt doesn't like it  . Penicillins Nausea Only and Rash    Patient Measurements: Height: 5\' 6"  (167.6 cm) Weight: 172 lb (78.019 kg) IBW/kg (Calculated) : 59.3  Vital Signs: Temp: 97.9 F (36.6 C) (05/01 1728) Temp src: Oral (05/01 1728) BP: 120/64 mmHg (05/01 1728) Pulse Rate: 107 (05/01 1728) Intake/Output from previous day:   Intake/Output from this shift: Total I/O In: 3300 [I.V.:3300] Out: 980 [Urine:775; Drains:105; Blood:100]  Labs: No results found for this basename: WBC, HGB, PLT, LABCREA, CREATININE,  in the last 72 hours Estimated Creatinine Clearance: 91.7 ml/min (by C-G formula based on Cr of 0.77). No results found for this basename: VANCOTROUGH, Leodis Binet, VANCORANDOM, GENTTROUGH, GENTPEAK, GENTRANDOM, TOBRATROUGH, TOBRAPEAK, TOBRARND, AMIKACINPEAK, AMIKACINTROU, AMIKACIN,  in the last 72 hours   Microbiology: Recent Results (from the past 720 hour(s))  SURGICAL PCR SCREEN     Status: None   Collection Time    06/07/12 10:57 AM      Result Value Range Status   MRSA, PCR NEGATIVE  NEGATIVE Final   Staphylococcus aureus NEGATIVE  NEGATIVE Final   Comment:            The Xpert SA Assay (FDA     approved for NASAL specimens     in patients over 60 years of age),     is one component of     a comprehensive surveillance     program.  Test performance has     been validated by [employer] for patients greater     than or equal to 59 year old.     It is not intended     to diagnose infection nor to     guide or monitor treatment.    Medical History: Past Medical History  Diagnosis Date  . Breast cancer   . PONV (postoperative nausea and vomiting)   . Depression     Medications:  Prescriptions prior to admission  Medication Sig Dispense Refill  . buPROPion (WELLBUTRIN XL) 300 MG 24 hr  tablet Take 300 mg by mouth daily.      Marland Kitchen escitalopram (LEXAPRO) 10 MG tablet Take 10 mg by mouth daily.      Marland Kitchen CALCIUM PO Take 1 tablet by mouth daily.      . Glucosamine-Chondroit-Vit C-Mn (GLUCOSAMINE-CHONDROITIN) TABS Take 1 tablet by mouth 2 (two) times daily.      . Multiple Vitamin (MULTIVITAMIN WITH MINERALS) TABS Take 1 tablet by mouth daily.       Assessment: 47 y.o. female s/p b/l total masectomy and bilateral breast reconstruction with tissue expanders and acellular dermal matrix 5/1. To receive post-op prophylaxis vancomycin. Pt received Vancomycin 1gm pre-op at 0947. Pt appears to have normal renal function.  Goal of Therapy:  Vancomycin trough level 10-15 mcg/ml  Plan:  1. Vancomycin 1 gm IV q12h. 2. Will f/u planned length of therapy and renal function  Christoper Fabian, PharmD, BCPS Clinical pharmacist, pager [phone]  06/14/2012,6:15 PM

## 2012-06-14 NOTE — H&P (Signed)
Norma [name] DOB: 03/31/1965 MRN: [id number]                                                                                      DATE: 05/11/2012  PCP: Lowella Dell, MD Referring Provider: No ref. provider found  IMPRESSION:  Right breast cancer, BRCA +  PLAN:   Bilateral total mastectomy, right SLN and bilateral tissue expander reconstruction                  HPI:  Norma [name] is a 47 y.o.  female who presents for surgery for Right breast cancer, BRCA +. She has elected bilateral mstectomy with right sentinel node to have tissue expander reconstruction  PMH:  has a past medical history of Breast cancer; PONV (postoperative nausea and vomiting); and Depression.  PSH:   has past surgical history that includes Anterior cruciate ligament repair (2010); Endometrial ablation; Dilation and curettage of uterus; and Excision basal cell carcinoma.  ALLERGIES:   Allergies  Allergen Reactions  . Adhesive (Tape)     Pt doesn't like it  . Penicillins Nausea Only and Rash    MEDICATIONS: Current facility-administered medications:fentaNYL (SUBLIMAZE) 0.05 MG/ML injection, , , , ;  fentaNYL (SUBLIMAZE) injection 50 mcg, 50 mcg, Intravenous, PRN, Raiford Simmonds, MD;  heparin 5000 UNIT/ML injection, , , , ;  midazolam (VERSED) 2 MG/2ML injection, , , , ;  midazolam (VERSED) injection 1 mg, 1 mg, Intravenous, PRN, Raiford Simmonds, MD vancomycin (VANCOCIN) IVPB 1000 mg/200 mL premix, 1,000 mg, Intravenous, On Call to OR, Etter Sjogren, MD  ROS: She Has no changes since February Note EXAM:   VITAL SIGNS:  BP 116/88  Pulse 67  Temp(Src) 98.2 F (36.8 C) (Oral)  Resp 20  SpO2 100%  LMP 06/13/2012  GENERAL:  The patient is alert, oriented, and generally healthy-appearing, NAD. Mood and affect are normal.  HEENT:  The head is normocephalic, the eyes nonicteric, the pupils were round regular and equal. EOMs are normal. Pharynx normal. Dentition good.  NECK:  The neck is  supple and there are no masses or thyromegaly.  LUNGS: Normal respirations and clear to auscultation.  HEART: Regular rhythm, with no murmurs rubs or gallops. Pulses are intact carotid dorsalis pedis and posterior tibial. No significant varicosities are noted.  BREASTS: No significant change from last office visit  ABDOMEN: Soft, flat, and nontender. No masses or organomegaly is noted. No hernias are noted. Bowel sounds are normal.  EXTREMITIES:  Good range of motion, no edema.   DATA REVIEWED:  I have reviewed pre-op labs    Srinika Delone J 06/14/2012

## 2012-06-14 NOTE — Anesthesia Postprocedure Evaluation (Signed)
  Anesthesia Post-op Note  Patient: Norma [name]  Procedure(s) Performed: Procedure(s): SIMPLE MASTECTOMY WITH AXILLARY SENTINEL NODE BIOPSY (Right) TOTAL MASTECTOMY (Left) BILATERAL BREAST RECONSTRUCTION WITH PLACEMENT OF BILATERAL TISSUE EXPANDERS (Bilateral)  Patient Location: PACU  Anesthesia Type:General  Level of Consciousness: awake, alert  and oriented  Airway and Oxygen Therapy: Patient Spontanous Breathing and Patient connected to nasal cannula oxygen  Post-op Pain: mild  Post-op Assessment: Post-op Vital signs reviewed, Patient's Cardiovascular Status Stable, Patent Airway and Pain level controlled  Post-op Vital Signs: stable  Complications: No apparent anesthesia complications

## 2012-06-15 ENCOUNTER — Encounter (HOSPITAL_COMMUNITY): Payer: Self-pay | Admitting: General Practice

## 2012-06-15 HISTORY — PX: SIMPLE MASTECTOMY W/ SENTINEL NODE BIOPSY: SHX2410

## 2012-06-15 MED ORDER — HYDROMORPHONE HCL PF 1 MG/ML IJ SOLN
1.0000 mg | INTRAMUSCULAR | Status: DC | PRN
  Administered 2012-06-15: 1 mg via INTRAVENOUS
  Filled 2012-06-15: qty 1

## 2012-06-15 MED ORDER — HEPARIN SODIUM (PORCINE) 5000 UNIT/ML IJ SOLN
5000.0000 [IU] | Freq: Three times a day (TID) | INTRAMUSCULAR | Status: DC
  Administered 2012-06-15 (×2): 5000 [IU] via SUBCUTANEOUS
  Filled 2012-06-15 (×3): qty 1

## 2012-06-15 MED ORDER — ACETAMINOPHEN 325 MG PO TABS
650.0000 mg | ORAL_TABLET | Freq: Four times a day (QID) | ORAL | Status: DC | PRN
  Administered 2012-06-15: 650 mg via ORAL
  Filled 2012-06-15: qty 2

## 2012-06-15 MED ORDER — HYDROMORPHONE HCL 2 MG PO TABS
2.0000 mg | ORAL_TABLET | ORAL | Status: DC | PRN
  Administered 2012-06-15: 4 mg via ORAL
  Administered 2012-06-15 (×2): 2 mg via ORAL
  Administered 2012-06-15 – 2012-06-16 (×2): 4 mg via ORAL
  Filled 2012-06-15 (×2): qty 1
  Filled 2012-06-15 (×3): qty 2

## 2012-06-15 MED FILL — Heparin Sodium (Porcine) Inj 5000 Unit/ML: INTRAMUSCULAR | Qty: 1 | Status: AC

## 2012-06-15 NOTE — Op Note (Signed)
Norma [name], Norma [name]             ACCOUNT NO.:  [id number]  MEDICAL RECORD NO.:  [id number]  LOCATION:                                 FACILITY:  PHYSICIAN:  Etter Sjogren, M.D.     DATE OF BIRTH:  Nov 26, 1965  DATE OF PROCEDURE:  06/14/2012 DATE OF DISCHARGE:                              OPERATIVE REPORT   PREOPERATIVE DIAGNOSIS:  Breast cancer.  POSTOPERATIVE DIAGNOSIS:  Breast cancer.  PROCEDURE PERFORMED: 1. Bilateral breast reconstruction with tissue expanders. 2. Bilateral chest wall reconstruction with acellular dermal matrix     (right side 60 cm2 and left side 60 cm2). 3. Vascular assessment of mastectomy sites multiple using the SPY     Elite and indocyanin green.  SURGEON:  Etter Sjogren, M.D.  ANESTHESIA:  General.  ESTIMATED BLOOD LOSS:  40 mL.  DRAINS:  Two 19-French on each side.  CLINICAL NOTE:  A 47 year old woman who has breast cancer and is BRCA positive, genetic positive.  She desired bilateral mastectomy, and this was also recommended by her general surgeon.  She was interested in reconstruction and after discussion of options, she selected use of a tissue expander as a stage procedure for eventual placement of implants. The nature of these procedures and risks plus complications were discussed with her in detail.  These risks include, but not limited to, bleeding, infection, healing problems, scarring, loss of sensation, loss of tissue, loss of portions of the mastectomy flaps, anesthesia complications, failure of device, capsular contracture, displacement of device, wrinkles, ripples, pneumothorax, pulmonary embolism, chronic pain, asymmetry, contour deformities, especially at the periphery of the reconstruction and disappointment.  She understood all this and wished to proceed.  DESCRIPTION OF PROCEDURE:  The patient was in the operating room, and the bilateral mastectomy then completed.  The skin flaps were inspected and there was thorough  irrigation with saline for the wounds.  The SPY Elite system was brought in and indocyanin green was delivered and the vascular assessment was made.  There was 1 area that was a little bit questionable at the lateral aspect of the superior mastectomy flap on the left side.  On assessment of the skin itself, there was bright red bleeding and was felt that this area which was approximately 2 x 4 cm, would in fact survive.  It was felt that it was okay to go ahead and proceed with the procedure.  Again thorough irrigation with saline, and the hemostasis with electrocautery.  The pectoralis muscles were then elevated and this dissection was continued down into the inframammary crease, and then a submuscular space was then developed as far medial as the origin of the pectoralis muscles off the sternum.  The irrigation with saline antibiotic solution.  After thoroughly cleaning gloves, the implants were prepared.  These were mentor moderate height 800 mL tissue expanders.  The air was removed, and 150 mL of sterile saline was placed using a closed filling system and these expanders were placed in antibiotic solution for greater than 5 minutes.  The acellular dermal matrix (flex HD) was also repaired using saline and then antibiotic solution and squeezing the AVM for any excess fluid and then returning it to antibiotic  solution for greater than 15 minutes.  After thoroughly cleaning gloves, the expanders were positioned submuscular and a check was made to make sure of hemostasis.  Care was taken to make sure that the expanders oriented properly and with the inferior aspect of the dissection at the inframammary crease.  The AVM was then laid in position and was secured using 3-0 PDS suture with great care taken to avoid damage to the underlying tissue expander on each side, which were kept under direct vision all times.  The superior lateral aspect of approximately a 2 cm space was left at the  level of the chest wall for drainage from the submuscular space.  This tiny area superiorly was intentionally left open in order to permit drainage.  Two 19-French drains were positioned on each side, brought through separate stab wounds inferiorly and secured with 3-0 Prolene sutures.  One was positioned in the axilla on each side, and the other under the inferior medial and superior space.  The closures with 3-0 Monocryl interrupted deep dermal sutures, and then the SPY elite system was brought back in to position and the indocyanin green was again delivered by anesthesia and the assessment of the vascular flaps was made, and it was noted to be very good.  It did not appear there were any areas of vascular compromise. Dermabond was used to seal the wounds and Biopatch with Tegaderm's for the drains.  ABDs were placed on the chest and the chest vest was positioned, and she was transferred to the recovery room in stable, having tolerated the procedure well.     Etter Sjogren, M.D.     DB/MEDQ  D:  06/14/2012  T:  06/15/2012  Job:  [id number]

## 2012-06-15 NOTE — Progress Notes (Signed)
UR completed. Bradan Congrove RNBSN 

## 2012-06-15 NOTE — Progress Notes (Signed)
1 Day Post-Op   Assessment: s/p Procedure(s): SIMPLE MASTECTOMY WITH AXILLARY SENTINEL NODE BIOPSY TOTAL MASTECTOMY BILATERAL BREAST RECONSTRUCTION WITH PLACEMENT OF BILATERAL TISSUE EXPANDERS Patient Active Problem List   Diagnosis Date Noted  . Cancer of upper-outer quadrant of female breast 03/22/2012    Priority: High  . Breast cancer 04/22/2012  . 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   Stable post op - seems to be doing well  Plan: Discharge per Dr Odis Luster  Subjective: Pain controlled, feels OK  Objective: Vital signs in last 24 hours: Temp:  [97.8 F (36.6 C)-99 F (37.2 C)] 97.9 F (36.6 C) (05/02 0500) Pulse Rate:  [67-107] 85 (05/02 0500) Resp:  [9-20] 16 (05/02 0500) BP: (96-131)/(60-88) 96/60 mmHg (05/02 0500) SpO2:  [96 %-100 %] 97 % (05/02 0500) Weight:  [172 lb (78.019 kg)] 172 lb (78.019 kg) (05/01 1728)   Intake/Output from previous day: 05/01 0701 - 05/02 0700 In: 3773.3 [I.V.:3773.3] Out: 3150 [Urine:2675; Drains:375; Blood:100]  General appearance: alert, cooperative and no distress Resp: clear to auscultation bilaterally  Incision: Did not remove dressing pending Dr Odis Luster visit  Lab Results:  No results found for this basename: WBC, HGB, HCT, PLT,  in the last 72 hours BMET No results found for this basename: NA, K, CL, CO2, GLUCOSE, BUN, CREATININE, CALCIUM,  in the last 72 hours  MEDS, Scheduled . buPROPion  300 mg Oral Daily  . docusate sodium  100 mg Oral Daily  . escitalopram  10 mg Oral Daily  . HYDROmorphone PCA 0.3 mg/mL   Intravenous Q4H  . methocarbamol  500 mg Oral QID  . vancomycin  1,000 mg Intravenous Q12H    Studies/Results: Nm Sentinel Node Inj-no Rpt (breast)  06/14/2012  CLINICAL DATA: right breast cancer   Sulfur colloid was injected intradermally by the nuclear medicine  technologist for breast cancer sentinel node localization.        LOS: 1 day     Currie Paris, MD, [facility],  [place] [phone]   06/15/2012 7:43 AM

## 2012-06-15 NOTE — Progress Notes (Signed)
Subjective: Sore. Good pain control. No nausea. Tolerating liquids well.  Objective: Vital signs in last 24 hours: Temp:  [97.8 F (36.6 C)-99 F (37.2 C)] 97.9 F (36.6 C) (05/02 0500) Pulse Rate:  [82-107] 85 (05/02 0500) Resp:  [9-17] 16 (05/02 0500) BP: (96-131)/(60-79) 96/60 mmHg (05/02 0500) SpO2:  [96 %-100 %] 97 % (05/02 0500) Weight:  [172 lb (78.019 kg)] 172 lb (78.019 kg) (05/01 1728)  Intake/Output from previous day: 05/01 0701 - 05/02 0700 In: 3773.3 [I.V.:3773.3] Out: 3150 [Urine:2675; Drains:375; Blood:100] Intake/Output this shift:    Operative sites: Mastectomy flaps viable. No evidence of vascular compromise. Tissue expanders appear to be in good position. Drains functioning. Drainage thin.  No results found for this basename: WBC, HGB, HCT, PLATELETS, NA, K, CL, CO2, BUN, CREATININE, GLU,  in the last 72 hours  Studies/Results: Nm Sentinel Node Inj-no Rpt (breast)  06/14/2012  CLINICAL DATA: right breast cancer   Sulfur colloid was injected intradermally by the nuclear medicine  technologist for breast cancer sentinel node localization.      Assessment/Plan: D/C PCA dilaudid. PO pain med. Ambulate in hall. D/C foley. Saline lock IV. Continue IV antibiotic for now due to implantation of prosthetic devices.   LOS: 1 day    Etter Sjogren M 06/15/2012 8:18 AM

## 2012-06-16 LAB — BASIC METABOLIC PANEL
CO2: 27 mEq/L (ref 19–32)
Chloride: 101 mEq/L (ref 96–112)
GFR calc Af Amer: >90 mL/min (ref >90)
Potassium: 3.2 mEq/L — ABNORMAL LOW (ref 3.5–5.1)
Sodium: 135 mEq/L (ref 135–145)

## 2012-06-16 MED ORDER — METHOCARBAMOL 500 MG PO TABS: 500.0000 mg | ORAL_TABLET | Freq: Four times a day (QID) | ORAL | Status: DC

## 2012-06-16 MED ORDER — HYDROMORPHONE HCL 2 MG PO TABS: 2.0000 mg | ORAL_TABLET | ORAL | Status: DC | PRN

## 2012-06-16 MED ORDER — SULFAMETHOXAZOLE-TRIMETHOPRIM 400-80 MG PO TABS: 1.0000 | ORAL_TABLET | Freq: Two times a day (BID) | ORAL | Status: DC

## 2012-06-16 MED ORDER — DSS 100 MG PO CAPS: 100.0000 mg | ORAL_CAPSULE | Freq: Every day | ORAL | Status: DC

## 2012-06-16 MED ORDER — ENOXAPARIN (LOVENOX) PATIENT EDUCATION KIT
PACK | Freq: Once | Status: AC
  Administered 2012-06-16: 11:00:00
  Filled 2012-06-16 (×2): qty 1

## 2012-06-16 MED ORDER — ENOXAPARIN SODIUM 40 MG/0.4ML ~~LOC~~ SOLN
40.0000 mg | SUBCUTANEOUS | Status: DC
  Administered 2012-06-16: 40 mg via SUBCUTANEOUS

## 2012-06-16 MED ORDER — ENOXAPARIN SODIUM 40 MG/0.4ML ~~LOC~~ SOLN: 40.0000 mg | SUBCUTANEOUS | Status: DC

## 2012-06-16 NOTE — Progress Notes (Signed)
Dc home with family, pt and family understand how to empty drains and record output, understood dc instructions with no questions ask, pt gave self Lovenox injection this am.

## 2012-06-16 NOTE — Progress Notes (Signed)
Patient feel well and ready to go. Already seen by Dr Odis Luster  Her path is still pending and I discussed that with her.  I will see in a few weeks and we can make plans for port once she has healed from this surgery

## 2012-06-16 NOTE — Discharge Summary (Signed)
Physician Discharge Summary  Patient ID: Norma [name] MRN: [id number] DOB/AGE: [birth year] 48 y.o.  Admit date: 06/14/2012 Discharge date: 06/16/2012  Admission Diagnoses:Breast cancer. BRCA positive.  Discharge Diagnoses: Same Principal Problem:   Cancer of upper-outer quadrant of female breast   Discharged Condition: good  Hospital Course: On the day of admission the patient was taken to surgery and had bilateral mastectomy, right sentinel node, and reconstruction with tissue expanders and Flex HD. The patient tolerated the procedures well. Postoperatively, the mastectomy  flaps maintained excellent color and capillary refill. The patient was ambulatory and tolerating diet on the first postoperative day. DVT prophylaxis was used. .  Treatments: antibiotics: vancomycin, anticoagulation: heparin and surgery: bilateral mastectomy, right sentinel node, and reconstruction with tissue expanders and Flex HD  Discharge Exam: Blood pressure 96/53, pulse 85, temperature 100.9 F (38.3 C), temperature source Oral, resp. rate 16, height 5\' 6"  (1.676 m), weight 172 lb (78.019 kg), last menstrual period 06/13/2012, SpO2 94.00%.  Operative sites: Mastectomy flaps viable. Tissue expanders appear in good position. Drains functioning. Drainage thin. There is no evidence of bleeding or infection either side.  Disposition: Final discharge disposition not confirmed   Future Appointments Provider Department Dept Phone   06/25/2012 3:30 PM Lowella Dell, MD [facility] MEDICAL ONCOLOGY [phone]   06/28/2012 1:40 PM Currie Paris, MD [facility] Surgery, [place] [phone]       Medication List    STOP taking these medications       Glucosamine-Chondroitin Tabs     multivitamin with minerals Tabs      TAKE these medications       buPROPion 300 MG 24 hr tablet  Commonly known as:  WELLBUTRIN XL  Take 300 mg by mouth daily.     CALCIUM PO  Take 1 tablet by mouth  daily.     DSS 100 MG Caps  Take 100 mg by mouth daily.     enoxaparin 40 MG/0.4ML injection  Commonly known as:  LOVENOX  Inject 0.4 mLs (40 mg total) into the skin daily.     escitalopram 10 MG tablet  Commonly known as:  LEXAPRO  Take 10 mg by mouth daily.     HYDROmorphone 2 MG tablet  Commonly known as:  DILAUDID  Take 1-2 tablets (2-4 mg total) by mouth every 4 (four) hours as needed for pain.     methocarbamol 500 MG tablet  Commonly known as:  ROBAXIN  Take 1 tablet (500 mg total) by mouth 4 (four) times daily.     sulfamethoxazole-trimethoprim 400-80 MG per tablet  Commonly known as:  BACTRIM  Take 1 tablet by mouth 2 (two) times daily.         SignedOdis Luster, Tyshea Imel M 06/16/2012, 8:06 AM

## 2012-06-18 ENCOUNTER — Telehealth (INDEPENDENT_AMBULATORY_CARE_PROVIDER_SITE_OTHER): Payer: Self-pay | Admitting: General Surgery

## 2012-06-18 ENCOUNTER — Encounter (HOSPITAL_COMMUNITY): Payer: Self-pay | Admitting: Surgery

## 2012-06-18 NOTE — Telephone Encounter (Signed)
Patient has appt scheduled 06/28/12.

## 2012-06-18 NOTE — Telephone Encounter (Signed)
Message copied by Liliana Cline on Mon Jun 18, 2012  8:36 AM ------      Message from: Currie Paris      Created: Sat Jun 16, 2012 10:24 AM       I need to see in two or three weeks to check and then get port scheduled. She had reconstructions so Odis Luster will be seeing next few post op visits. She went home on Saturday ------

## 2012-06-19 ENCOUNTER — Telehealth (INDEPENDENT_AMBULATORY_CARE_PROVIDER_SITE_OTHER): Payer: Self-pay

## 2012-06-19 NOTE — Telephone Encounter (Signed)
Dr Jamey Ripa reviewed pathology and patient made aware known area of malignancy was seen on the right, but margins were negative. Two lymph nodes on right are negative and breast tissue was all benign on the left. She will call with any other questions and keep follow up appt with [place].

## 2012-06-19 NOTE — Telephone Encounter (Signed)
Patient is calling for pathology results.  I do not see them in yet if you can check for her.

## 2012-06-25 ENCOUNTER — Ambulatory Visit (HOSPITAL_BASED_OUTPATIENT_CLINIC_OR_DEPARTMENT_OTHER): Payer: BC Managed Care – PPO | Admitting: Oncology

## 2012-06-25 VITALS — BP 114/75 | HR 75 | Temp 98.1°F | Resp 20 | Ht 66.0 in | Wt 167.0 lb

## 2012-06-25 DIAGNOSIS — Z1501 Genetic susceptibility to malignant neoplasm of breast: Secondary | ICD-10-CM

## 2012-06-25 DIAGNOSIS — Z171 Estrogen receptor negative status [ER-]: Secondary | ICD-10-CM

## 2012-06-25 DIAGNOSIS — C50919 Malignant neoplasm of unspecified site of unspecified female breast: Secondary | ICD-10-CM

## 2012-06-25 DIAGNOSIS — C50411 Malignant neoplasm of upper-outer quadrant of right female breast: Secondary | ICD-10-CM

## 2012-06-25 NOTE — Progress Notes (Signed)
ID: [name]   DOB: 15-Apr-1965  MR#: [id number]  WJX#:[id number]  PCP: Selena Batten GYN: Gerald Leitz SU: Cicero Duck OTHER MD: Lurline Hare, Rogelia Mire, [place], Etter Sjogren   HISTORY OF PRESENT ILLNESS: [name] had routine screening mammography at [facility] 11/16/2010 suggesting additional imaging on the right. This was performed the next day, and found no significant residual abnormality. Repeat screening mammography 03/15/2012 again suggested an area of architectural distortion in the right breast. In the same quadrant as before. Additional views 03/19/2012 found a 3 mm hypoechoic mass in the area in question. Biopsy of this mass 03/20/2012 showed an invasive ductal carcinoma, grade 1-2, triple negative, with an MIB-1 of 100%.  Breast MRI 03/23/2012 showed an area of poorly defined enhancement in the right breast associated with clip artifact. This measured 1 cm. In the left breast there was an enhancing mass measuring 2.0 cm. There were no other suspicious findings. The patient's subsequent history is as detailed below.  INTERVAL HISTORY: Sharmeka returns today accompanied by her mother for followup of her breast cancer. Since her last visit here she had bilateral mastectomies with implant reconstruction. The left breast was benign as expected. The mass in the right breast was slightly larger than anticipated (T1c instead of T1b). Lymph nodes were negative. Port was not placed during this procedure  REVIEW OF SYSTEMS: She doesn't have so much pain has discomfort. She is still using hydromorphone at night, as well as Robaxin. Her drains are still in place. This is what bothers her the most, because she can't shower. She has had mild temperatures, but no frank infection or inflammation. She is having some hot flashes, which are not new. Otherwise a detailed review of systems today was benign  PAST MEDICAL HISTORY: Past Medical History  Diagnosis Date  . Breast cancer   . PONV  (postoperative nausea and vomiting)   . Depression     PAST SURGICAL HISTORY: Past Surgical History  Procedure Laterality Date  . Anterior cruciate ligament repair  2010  . Endometrial ablation      2012  . Dilation and curettage of uterus    . Basal cell carcinoma excision      OFF NOSE  2011   . Simple mastectomy w/ sentinel node biopsy Bilateral 06/15/2012    Dr Jamey Ripa  . Simple mastectomy with axillary sentinel node biopsy Right 06/14/2012    Procedure: SIMPLE MASTECTOMY WITH AXILLARY SENTINEL NODE BIOPSY;  Surgeon: Currie Paris, MD;  Location: [facility] OR;  Service: General;  Laterality: Right;  . Total mastectomy Left 06/14/2012    Procedure: TOTAL MASTECTOMY;  Surgeon: Currie Paris, MD;  Location: [facility] OR;  Service: General;  Laterality: Left;  . Breast reconstruction with placement of tissue expander and flex hd (acellular hydrated dermis) Bilateral 06/14/2012    Procedure: BILATERAL BREAST RECONSTRUCTION WITH PLACEMENT OF BILATERAL TISSUE EXPANDERS;  Surgeon: Etter Sjogren, MD;  Location[facility] OR;  Service: Plastics;  Laterality: Bilateral;  Moh's surgery for basal cell, Left nares  FAMILY HISTORY Family History  Problem Relation Age of Onset  . Breast cancer Maternal Aunt     dx in her 41s  . Ovarian cancer Maternal Aunt     dx in her 49s  . Esophageal cancer Maternal Uncle   . Liver cancer Maternal Uncle   . Lung cancer Maternal Uncle   . BRCA 1/2 Maternal Uncle     BRCA 2 +  . Breast cancer Cousin     dx in  her 30s; BRCA2+  . Pancreatic cancer Maternal Uncle     dx in his 62s  . BRCA 1/2 Maternal Uncle     BRCA2+  . Colon cancer Maternal Uncle    the patient's parents are alive, in their early 57s. The patient has one brother and one sister. There is a significant family history of cancer and this includes one of the patient's mother is 2 sisters with ovarian and breast cancer. A first cousin of the patient had breast cancer in her 30s. She has been tested for the BRCA  gene, but the patient does not know the results. There were also brothers of the patient's mother with esophageal pancreas and colon cancer. The patient has been scheduled for genetic testing.  GYNECOLOGIC HISTORY: Menarche age 16, first live birth age 6, she is GX P2. The patient's periods are increase in bili scans and irregular, but still ongoing. She did take birth control pills for approximately 10 years with no untoward events.  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 Her husband [name] is a Academic librarian. He owns his own business. He is originally from [place]. Their children are [name] 17 and [name] 14.   ADVANCED DIRECTIVES: Not in place. If both she and her husband become disabled and cannot make her own health care decisions, the patient intends to name her friend [name] Scutari as healthcare power of attorney. Otherwise of course she and [name] will be each other self care power of attorney  HEALTH MAINTENANCE: History  Substance Use Topics  . Smoking status: Never Smoker   . Smokeless tobacco: Never Used  . Alcohol Use: Yes     Comment: daily     Colonoscopy:  PAP:  Bone density:  Lipid panel:  Allergies  Allergen Reactions  . Adhesive (Tape)     Pt doesn't like it  . Penicillins Nausea Only and Rash    Current Outpatient Prescriptions  Medication Sig Dispense Refill  . buPROPion (WELLBUTRIN XL) 300 MG 24 hr tablet Take 300 mg by mouth daily.      Marland Kitchen CALCIUM PO Take 1 tablet by mouth daily.      Marland Kitchen docusate sodium 100 MG CAPS Take 100 mg by mouth daily.  30 capsule  1  . enoxaparin (LOVENOX) 40 MG/0.4ML injection Inject 0.4 mLs (40 mg total) into the skin daily.  12 Syringe  0  . escitalopram (LEXAPRO) 10 MG tablet Take 10 mg by mouth daily.      Marland Kitchen HYDROmorphone (DILAUDID) 2 MG tablet Take 1-2 tablets (2-4 mg total) by mouth every 4 (four) hours as needed for pain.  50 tablet  0  . methocarbamol (ROBAXIN) 500  MG tablet Take 1 tablet (500 mg total) by mouth 4 (four) times daily.  40 tablet  1  . sulfamethoxazole-trimethoprim (BACTRIM) 400-80 MG per tablet Take 1 tablet by mouth 2 (two) times daily.  20 tablet  0   No current facility-administered medications for this visit.    OBJECTIVE: Middle-aged white woman in no acute distress Filed Vitals:   06/25/12 1535  BP: 114/75  Pulse: 75  Temp: 98.1 F (36.7 C)  Resp: 20     Body mass index is 26.97 kg/(m^2).    ECOG FS: 1  Sclerae unicteric Oropharynx clear No cervical or supraclavicular adenopathy Lungs no rales or rhonchi Heart regular rate and rhythm Abd benign MSK no focal spinal tenderness, no peripheral edema Neuro: nonfocal,  well oriented, pleasant affect Breasts: Status post bilateral mastectomies with expanders in place. The cosmetic result appears good. 2 drains are still in place. There is no unusual bruising, dehiscence, erythema, or swelling. Both axillae are benign    LAB RESULTS: Lab Results  Component Value Date   WBC 9.1 06/07/2012   NEUTROABS 3.3 03/28/2012   HGB 14.6 06/07/2012   HCT 40.9 06/07/2012   MCV 93.0 06/07/2012   PLT 223 06/07/2012      Chemistry      Component Value Date/Time   NA 135 06/16/2012 0435   NA 139 03/28/2012 0826   K 3.2* 06/16/2012 0435   K 4.6 03/28/2012 0826   CL 101 06/16/2012 0435   CL 105 03/28/2012 0826   CO2 27 06/16/2012 0435   CO2 26 03/28/2012 0826   BUN 4* 06/16/2012 0435   BUN 12.1 03/28/2012 0826   CREATININE 0.66 06/16/2012 0435   CREATININE 0.9 03/28/2012 0826      Component Value Date/Time   CALCIUM 8.4 06/16/2012 0435   CALCIUM 9.3 03/28/2012 0826   ALKPHOS 54 06/07/2012 1053   ALKPHOS 58 03/28/2012 0826   AST 27 06/07/2012 1053   AST 26 03/28/2012 0826   ALT 16 06/07/2012 1053   ALT 23 03/28/2012 0826   BILITOT 0.3 06/07/2012 1053   BILITOT 1.06 03/28/2012 0826       Lab Results  Component Value Date   LABCA2 23 03/28/2012    No components found with this basename: [id number]     No results found for this basename: INR,  in the last 168 hours  Urinalysis No results found for this basename: colorurine,  appearanceur,  labspec,  phurine,  glucoseu,  hgbur,  bilirubinur,  ketonesur,  proteinur,  urobilinogen,  nitrite,  leukocytesur    STUDIES: Nm Sentinel Node Inj-no Rpt (breast)  06/14/2012   CLINICAL DATA: right breast cancer   Sulfur colloid was injected intradermally by the nuclear medicine  technologist for breast cancer sentinel node localization.       ASSESSMENT: 47 y.o. BRCA-2 positive Pura Spice woman status post right breast biopsy 03/20/2012 for a clinical T1b N0, stage IA invasive ductal carcinoma, grade 1-2, triple negative, with an MIB-1 of 100%  (2) biopsy of a suspicious left breast mass was benign  (3) status post bilateral mastectomies with right sentinel lymph node sampling and immediate expander placement 06/14/2012, for a left-sided pT1c pN0, stage IA invasive ductal carcinoma, grade 1, with ample margins, triple negative, with an MIB-1 of 100%. (The left breast was benign)  (4) bilateral salpingo-oophorectomy to follow as convenient  PLAN:  While we had originally thought of doing 4 cycles of Cytoxan/Taxotere for aT1b lesion, now that we are dealing with a slightly larger tumor, T1c, I think standard dose dense doxorubicin/cyclophosphamide followed by dose dense paclitaxel would be a better choice. We discussed the possible toxicities, side effects, and complications, and Dorien has already been through chemotherapy school. She does need to heal from her recent surgery, and so long as we start chemotherapy within 2 months of her surgical date we should be fine.  Because of a trip she has planned to [place], we are going to start chemotherapy June 16. She will need a port placed before then. She will also see [place] before that visit particularly to discuss upper. Use of anti-emetics and schedule her followups and labs. She will also need an  echocardiogram before starting treatment.  Aerie has a very good understanding of all  this. She will not need adjuvant radiation. Once she is done with active treatment for her breast cancer she will proceed to bilateral salpingo-oophorectomy at her and Dr. Dawayne Patricia discretion.      Chelsea Pedretti C    06/25/2012

## 2012-06-28 ENCOUNTER — Encounter (INDEPENDENT_AMBULATORY_CARE_PROVIDER_SITE_OTHER): Payer: Self-pay | Admitting: Surgery

## 2012-06-28 ENCOUNTER — Ambulatory Visit (INDEPENDENT_AMBULATORY_CARE_PROVIDER_SITE_OTHER): Payer: BC Managed Care – PPO | Admitting: Surgery

## 2012-06-28 VITALS — BP 110/66 | HR 72 | Temp 97.5°F | Resp 16 | Ht 66.0 in | Wt 167.8 lb

## 2012-06-28 DIAGNOSIS — Z09 Encounter for follow-up examination after completed treatment for conditions other than malignant neoplasm: Secondary | ICD-10-CM

## 2012-06-28 NOTE — Progress Notes (Signed)
Norma [name]    [id number] 06/28/2012    1965-08-05   CC:   Chief Complaint  Patient presents with  . Follow-up    1st po simple maste     HPI:  The patient returns for post op follow-up. She underwent a bilateral mastectomy and reconstruction on 06/14/12. Over all she feels that she is doing well. Wants to arrange port placement  PE: VITAL SIGNS: BP 110/66  Pulse 72  Temp(Src) 97.5 F (36.4 C) (Oral)  Resp 16  Ht 5\' 6"  (1.676 m)  Wt 167 lb 12.8 oz (76.114 kg)  BMI 27.1 kg/m2  LMP 03/05/2012  Breast: The incision is healing nicely and there is no evidence of infection or hematoma.  The drains are still in - managed by Dr Odis Luster.  DATA REVIEWED: Pathology report: Noted and discussed with patient  IMPRESSION: Patient doing well. Will have her call when ready to get port placed  PLAN: Her next visit will be in four months.

## 2012-06-28 NOTE — Patient Instructions (Signed)
Call when Dr Odis Luster says it is ok to put your port in and we will get it scheduled

## 2012-07-02 ENCOUNTER — Telehealth: Payer: Self-pay | Admitting: *Deleted

## 2012-07-02 NOTE — Telephone Encounter (Signed)
Per staff message and POF I have scheduled appts.  JMW  

## 2012-07-05 ENCOUNTER — Other Ambulatory Visit: Payer: Self-pay | Admitting: Oncology

## 2012-07-05 DIAGNOSIS — C50919 Malignant neoplasm of unspecified site of unspecified female breast: Secondary | ICD-10-CM

## 2012-07-16 ENCOUNTER — Telehealth: Payer: Self-pay | Admitting: Oncology

## 2012-07-16 NOTE — Telephone Encounter (Signed)
, °

## 2012-07-17 ENCOUNTER — Other Ambulatory Visit: Payer: Self-pay | Admitting: Physician Assistant

## 2012-07-17 DIAGNOSIS — C50411 Malignant neoplasm of upper-outer quadrant of right female breast: Secondary | ICD-10-CM

## 2012-07-18 ENCOUNTER — Ambulatory Visit (HOSPITAL_BASED_OUTPATIENT_CLINIC_OR_DEPARTMENT_OTHER): Payer: BC Managed Care – PPO | Admitting: Physician Assistant

## 2012-07-18 ENCOUNTER — Telehealth: Payer: Self-pay | Admitting: *Deleted

## 2012-07-18 ENCOUNTER — Other Ambulatory Visit (HOSPITAL_BASED_OUTPATIENT_CLINIC_OR_DEPARTMENT_OTHER): Payer: BC Managed Care – PPO | Admitting: Lab

## 2012-07-18 ENCOUNTER — Encounter: Payer: Self-pay | Admitting: Physician Assistant

## 2012-07-18 VITALS — BP 117/74 | HR 88 | Temp 98.4°F | Resp 20 | Ht 66.0 in | Wt 172.0 lb

## 2012-07-18 DIAGNOSIS — Z1501 Genetic susceptibility to malignant neoplasm of breast: Secondary | ICD-10-CM

## 2012-07-18 DIAGNOSIS — Z171 Estrogen receptor negative status [ER-]: Secondary | ICD-10-CM

## 2012-07-18 DIAGNOSIS — C50419 Malignant neoplasm of upper-outer quadrant of unspecified female breast: Secondary | ICD-10-CM

## 2012-07-18 DIAGNOSIS — C50411 Malignant neoplasm of upper-outer quadrant of right female breast: Secondary | ICD-10-CM

## 2012-07-18 DIAGNOSIS — F411 Generalized anxiety disorder: Secondary | ICD-10-CM

## 2012-07-18 DIAGNOSIS — Z9010 Acquired absence of unspecified breast and nipple: Secondary | ICD-10-CM

## 2012-07-18 LAB — COMPREHENSIVE METABOLIC PANEL (CC13)
AST: 21 U/L (ref 5–34)
Albumin: 4.1 g/dL (ref 3.5–5.0)
Alkaline Phosphatase: 71 U/L (ref 40–150)
BUN: 9.8 mg/dL (ref 7.0–26.0)
Creatinine: 0.9 mg/dL (ref 0.6–1.1)
Potassium: 3.9 mEq/L (ref 3.5–5.1)
Total Bilirubin: 0.81 mg/dL (ref 0.20–1.20)

## 2012-07-18 LAB — CBC WITH DIFFERENTIAL/PLATELET
Basophils Absolute: 0 10*3/uL (ref 0.0–0.1)
Eosinophils Absolute: 0.3 10*3/uL (ref 0.0–0.5)
HGB: 13.1 g/dL (ref 11.6–15.9)
LYMPH%: 33.7 % (ref 14.0–49.7)
MCV: 95.5 fL (ref 79.5–101.0)
MONO#: 0.5 10*3/uL (ref 0.1–0.9)
NEUT#: 3.3 10*3/uL (ref 1.5–6.5)
Platelets: 214 10*3/uL (ref 145–400)
RBC: 3.95 10*6/uL (ref 3.70–5.45)
RDW: 12.7 % (ref 11.2–14.5)
WBC: 6.2 10*3/uL (ref 3.9–10.3)

## 2012-07-18 MED ORDER — LIDOCAINE-PRILOCAINE 2.5-2.5 % EX CREA: TOPICAL_CREAM | CUTANEOUS | Status: DC

## 2012-07-18 MED ORDER — LORAZEPAM 0.5 MG PO TABS: 0.5000 mg | ORAL_TABLET | Freq: Every evening | ORAL | Status: DC | PRN

## 2012-07-18 MED ORDER — DEXAMETHASONE 4 MG PO TABS: ORAL_TABLET | ORAL | Status: DC

## 2012-07-18 MED ORDER — PROCHLORPERAZINE MALEATE 10 MG PO TABS: 10.0000 mg | ORAL_TABLET | Freq: Four times a day (QID) | ORAL | Status: DC | PRN

## 2012-07-18 NOTE — Telephone Encounter (Signed)
appts made and printed. Pt is aware that tx will follow after seeing GCM on 08/13/12. i emailed MW to adjust the time...td

## 2012-07-18 NOTE — Progress Notes (Signed)
ID: Norma [name]   DOB: [birth year]  MR#: [id number]  YQM#:[id number]  PCP: Selena Batten GYN: Gerald Leitz SU: Cicero Duck OTHER MD: Lurline Hare, Rogelia Mire, [place], Etter Sjogren   HISTORY OF PRESENT ILLNESS: Norma [name] had routine screening mammography at [facility] 11/16/2010 suggesting additional imaging on the right. This was performed the next day, and found no significant residual abnormality. Repeat screening mammography 03/15/2012 again suggested an area of architectural distortion in the right breast. In the same quadrant as before. Additional views 03/19/2012 found a 3 mm hypoechoic mass in the area in question. Biopsy of this mass 03/20/2012 showed an invasive ductal carcinoma, grade 1-2, triple negative, with an MIB-1 of 100%.  Breast MRI 03/23/2012 showed an area of poorly defined enhancement in the right breast associated with clip artifact. This measured 1 cm. In the left breast there was an enhancing mass measuring 2.0 cm. There were no other suspicious findings. The patient's subsequent history is as detailed below.  INTERVAL HISTORY: Norma [name] returns today for followup of her right breast cancer.  She is planning to initiate adjuvant chemotherapy on June 16, and is here today to review her treatment plan discuss her antinausea regimen.  Norma [name] herself is feeling well physically. Her energy level is good. She's had no complications following her mastectomies. She is awaiting an appointment for port placement, and plans to have that done in the next week and a half prior to chemotherapy. She does have some mild anxiety, and some associated insomnia. She takes some Benadryl at night which helps, but she still wakes up during the night and "can't stop thinking".   REVIEW OF SYSTEMS: Markeia denies any recent illnesses and has had no fevers or chills. She does have occasional hot flashes. She has not had an actual period since January 2014, but did have some mild vaginal spotting in May,  just after surgery. She denies any rashes and has had no abnormal bleeding. She's eating well denies nausea or change in bowel or bladder habits. She's had no cough, shortness of breath, chest pain, or palpitations. She's had no abnormal headaches or dizziness. She also currently denies any unusual myalgias, arthralgias, or bony pain. She's had no peripheral swelling.  A detailed review of systems is otherwise stable and noncontributory.    PAST MEDICAL HISTORY: Past Medical History  Diagnosis Date  . Breast cancer   . PONV (postoperative nausea and vomiting)   . Depression     PAST SURGICAL HISTORY: Past Surgical History  Procedure Laterality Date  . Anterior cruciate ligament repair  2010  . Endometrial ablation      2012  . Dilation and curettage of uterus    . Basal cell carcinoma excision      OFF NOSE  2011   . Simple mastectomy w/ sentinel node biopsy Bilateral 06/15/2012    Dr Jamey Ripa  . Simple mastectomy with axillary sentinel node biopsy Right 06/14/2012    Procedure: SIMPLE MASTECTOMY WITH AXILLARY SENTINEL NODE BIOPSY;  Surgeon: Currie Paris, MD;  Location: [facility] OR;  Service: General;  Laterality: Right;  . Total mastectomy Left 06/14/2012    Procedure: TOTAL MASTECTOMY;  Surgeon: Currie Paris, MD;  Location: [facility] OR;  Service: General;  Laterality: Left;  . Breast reconstruction with placement of tissue expander and flex hd (acellular hydrated dermis) Bilateral 06/14/2012    Procedure: BILATERAL BREAST RECONSTRUCTION WITH PLACEMENT OF BILATERAL TISSUE EXPANDERS;  Surgeon: Etter Sjogren, MD;  Location: [facility] OR;  Service: Plastics;  Laterality:  Bilateral;  Moh's surgery for basal cell, Left nares  FAMILY HISTORY Family History  Problem Relation Age of Onset  . Breast cancer Maternal Aunt     dx in her 25s  . Ovarian cancer Maternal Aunt     dx in her 7s  . Esophageal cancer Maternal Uncle   . Liver cancer Maternal Uncle   . Lung cancer Maternal Uncle   . BRCA 1/2  Maternal Uncle     BRCA 2 +  . Breast cancer Cousin     dx in her 30s; BRCA2+  . Pancreatic cancer Maternal Uncle     dx in his 67s  . BRCA 1/2 Maternal Uncle     BRCA2+  . Colon cancer Maternal Uncle    the patient's parents are alive, in their early 49s. The patient has one brother and one sister. There is a significant family history of cancer and this includes one of the patient's mother is 2 sisters with ovarian and breast cancer. A first cousin of the patient had breast cancer in her 30s. She has been tested for the BRCA gene, but the patient does not know the results. There were also brothers of the patient's mother with esophageal pancreas and colon cancer. The patient has been scheduled for genetic testing.  GYNECOLOGIC HISTORY: Menarche age 69, first live birth age 37, she is GX P2. The patient's periods are increase in bili scans and irregular, but still ongoing. She did take birth control pills for approximately 10 years with no untoward events.  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 Her husband [name] is a Academic librarian. He owns his own business. He is originally from [place]. Their children are Norma [name] 17 and Norma [name] 14.     ADVANCED DIRECTIVES: Not in place. If both she and her husband become disabled and cannot make her own health care decisions, the patient intends to name her friend [name] as healthcare power of attorney. Otherwise of course she and [name] will be each other self care power of attorney  HEALTH MAINTENANCE: History  Substance Use Topics  . Smoking status: Never Smoker   . Smokeless tobacco: Never Used  . Alcohol Use: Yes     Comment: daily     Colonoscopy:  PAP:  Bone density:  Lipid panel:  Allergies  Allergen Reactions  . Adhesive (Tape)     Pt doesn't like it  . Penicillins Nausea Only and Rash    Current Outpatient Prescriptions  Medication Sig Dispense Refill  . buPROPion  (WELLBUTRIN XL) 300 MG 24 hr tablet Take 300 mg by mouth daily.      Marland Kitchen escitalopram (LEXAPRO) 10 MG tablet Take 10 mg by mouth daily.      . methocarbamol (ROBAXIN) 500 MG tablet Take 1 tablet (500 mg total) by mouth 4 (four) times daily.  40 tablet  1  . CALCIUM PO Take 1 tablet by mouth daily.      Marland Kitchen dexamethasone (DECADRON) 4 MG tablet Take 2 tablets by mouth twice daily with food for 3 days after each chemo.  30 tablet  1  . lidocaine-prilocaine (EMLA) cream Apply to port 1-2 hr before each procedure  30 g  2  . LORazepam (ATIVAN) 0.5 MG tablet Take 1 tablet (0.5 mg total) by mouth at bedtime as needed (anxiety, insomnia, or nausea).  30 tablet  0  . prochlorperazine (COMPAZINE) 10 MG tablet Take 1 tablet (10  mg total) by mouth every 6 (six) hours as needed (Nausea or vomiting).  30 tablet  1   No current facility-administered medications for this visit.    OBJECTIVE: Middle-aged white woman in no acute distress Filed Vitals:   07/18/12 1321  BP: 117/74  Pulse: 88  Temp: 98.4 F (36.9 C)  Resp: 20     Body mass index is 27.77 kg/(m^2).    ECOG FS: 0 Filed Weights   07/18/12 1321  Weight: 172 lb (78.019 kg)    Sclerae unicteric Oropharynx clear No cervical or supraclavicular adenopathy Lungs clear to auscultation bilaterally, no rales or rhonchi Heart regular rate and rhythm Abdomen soft, nontender to palpation, with positive bowel sounds MSK no focal spinal tenderness, no peripheral edema Neuro: nonfocal, well oriented, pleasant affect Breasts: Status post bilateral mastectomies with expanders in place. Well-healing incisions. Both axillae are benign, with no palpable adenopathy.    LAB RESULTS: Lab Results  Component Value Date   WBC 6.2 07/18/2012   NEUTROABS 3.3 07/18/2012   HGB 13.1 07/18/2012   HCT 37.7 07/18/2012   MCV 95.5 07/18/2012   PLT 214 07/18/2012      Chemistry      Component Value Date/Time   NA 137 07/18/2012 1304   NA 135 06/16/2012 0435   K 3.9 07/18/2012  1304   K 3.2* 06/16/2012 0435   CL 103 07/18/2012 1304   CL 101 06/16/2012 0435   CO2 24 07/18/2012 1304   CO2 27 06/16/2012 0435   BUN 9.8 07/18/2012 1304   BUN 4* 06/16/2012 0435   CREATININE 0.9 07/18/2012 1304   CREATININE 0.66 06/16/2012 0435      Component Value Date/Time   CALCIUM 9.3 07/18/2012 1304   CALCIUM 8.4 06/16/2012 0435   ALKPHOS 71 07/18/2012 1304   ALKPHOS 54 06/07/2012 1053   AST 21 07/18/2012 1304   AST 27 06/07/2012 1053   ALT 13 07/18/2012 1304   ALT 16 06/07/2012 1053   BILITOT 0.81 07/18/2012 1304   BILITOT 0.3 06/07/2012 1053       Lab Results  Component Value Date   LABCA2 23 03/28/2012     STUDIES:  Echocardiogram scheduled for 07/23/2012.   ASSESSMENT: 47 y.o. BRCA-2 positive Pura Spice woman   (1)  status post right breast biopsy 03/20/2012 for a clinical T1b N0, stage IA invasive ductal carcinoma, grade 1-2, triple negative, with an MIB-1 of 100%  (2) biopsy of a suspicious left breast mass was benign  (3) status post bilateral mastectomies with right sentinel lymph node sampling and immediate expander placement 06/14/2012, for a right-sided pT1c pN0, stage IA invasive ductal carcinoma, grade 1, with ample margins, triple negative, with an MIB-1 of 100%. (The left breast was benign)  (4) bilateral salpingo-oophorectomy to follow as convenient  (5)  Patient will not need radiation therapy   PLAN:  The majority of our 45 minute appointment today was spent answering Demarie's questions, reviewing her treatment plan and antinausea regimen, and coordinating care. She's already scheduled for an echocardiogram next week, and is in the process of getting her port placement scheduled under the care of Dr. Jamey Ripa.  Emmabelle will return for her first dose of dose dense doxorubicin/cyclophosphamide on June 16. She was given prescriptions for all of the necessary medications, specifically dexamethasone (2 tablets by mouth twice a day with food x3 days after chemotherapy);  prochlorperazine (one tablet with meals and bedtime for 3 days after chemotherapy, then every 6 hours as needed for  nausea); lorazepam (one tablet at night as needed for anxiety/insomnia/nausea); and MR cream to apply to port as needed. She was also advised to take Claritin daily for approximately 5 days after the Neulasta injection. She is given all of this information in writing today, and voiced an understanding of how to utilize all of these medications appropriately.  Plan on seeing Jolan back on June 13, just prior to her first cycle of chemotherapy. Course also see her the week after her first treatment for assessment of chemotoxicity on June 24. Because of vacation schedules, we have gone ahead and schedule her through her first 4 cycles of adjuvant chemotherapy.  Timmy voices her understanding and agreement with all of the above, and knows to call with any changes, questions, or problems prior to her next scheduled appointment.  She will not need adjuvant radiation. Once she is done with active treatment for her breast cancer she will proceed to bilateral salpingo-oophorectomy at her and Dr. Dawayne Patricia discretion.      Mizael Sagar    07/18/2012

## 2012-07-23 ENCOUNTER — Ambulatory Visit (HOSPITAL_COMMUNITY)
Admission: RE | Admit: 2012-07-23 | Discharge: 2012-07-23 | Disposition: A | Discharge status: Home / Self Care | Payer: BC Managed Care – PPO | Source: Ambulatory Visit | Attending: Oncology | Admitting: Oncology

## 2012-07-23 DIAGNOSIS — Z01818 Encounter for other preprocedural examination: Secondary | ICD-10-CM | POA: Insufficient documentation

## 2012-07-23 DIAGNOSIS — Z9010 Acquired absence of unspecified breast and nipple: Secondary | ICD-10-CM | POA: Insufficient documentation

## 2012-07-23 DIAGNOSIS — C50919 Malignant neoplasm of unspecified site of unspecified female breast: Secondary | ICD-10-CM | POA: Insufficient documentation

## 2012-07-23 DIAGNOSIS — Z0189 Encounter for other specified special examinations: Secondary | ICD-10-CM

## 2012-07-23 NOTE — Progress Notes (Signed)
  Echocardiogram 2D Echocardiogram has been performed.  Norma [name] 07/23/2012, 11:11 AM

## 2012-07-24 ENCOUNTER — Telehealth (INDEPENDENT_AMBULATORY_CARE_PROVIDER_SITE_OTHER): Payer: Self-pay | Admitting: *Deleted

## 2012-07-24 NOTE — Telephone Encounter (Signed)
Patient called to state that she is starting her chemo treatment on Monday but has not heard from anyone regarding placing her PAC.

## 2012-07-25 NOTE — Telephone Encounter (Signed)
Spoke to [employer] in scheduling who states they received a message from Dr. Jamey Ripa to get patient's [facility] insert scheduled before Monday either with another surgeon or with radiology.  Surgery schedulers are working on this at this time.

## 2012-07-26 ENCOUNTER — Other Ambulatory Visit: Payer: Self-pay | Admitting: Physician Assistant

## 2012-07-26 DIAGNOSIS — C50411 Malignant neoplasm of upper-outer quadrant of right female breast: Secondary | ICD-10-CM

## 2012-07-27 ENCOUNTER — Telehealth (INDEPENDENT_AMBULATORY_CARE_PROVIDER_SITE_OTHER): Payer: Self-pay | Admitting: General Surgery

## 2012-07-27 ENCOUNTER — Other Ambulatory Visit (HOSPITAL_BASED_OUTPATIENT_CLINIC_OR_DEPARTMENT_OTHER): Payer: BC Managed Care – PPO | Admitting: Lab

## 2012-07-27 ENCOUNTER — Encounter (HOSPITAL_COMMUNITY): Payer: Self-pay | Admitting: Pharmacy Technician

## 2012-07-27 ENCOUNTER — Other Ambulatory Visit: Payer: Self-pay | Admitting: *Deleted

## 2012-07-27 ENCOUNTER — Other Ambulatory Visit: Payer: Self-pay | Admitting: Radiology

## 2012-07-27 ENCOUNTER — Other Ambulatory Visit: Payer: Self-pay | Admitting: Physician Assistant

## 2012-07-27 ENCOUNTER — Ambulatory Visit (HOSPITAL_BASED_OUTPATIENT_CLINIC_OR_DEPARTMENT_OTHER): Payer: BC Managed Care – PPO | Admitting: Physician Assistant

## 2012-07-27 ENCOUNTER — Encounter: Payer: Self-pay | Admitting: Physician Assistant

## 2012-07-27 VITALS — BP 117/74 | HR 66 | Temp 98.4°F | Resp 18 | Ht 66.0 in | Wt 173.5 lb

## 2012-07-27 DIAGNOSIS — C50411 Malignant neoplasm of upper-outer quadrant of right female breast: Secondary | ICD-10-CM

## 2012-07-27 DIAGNOSIS — Z1501 Genetic susceptibility to malignant neoplasm of breast: Secondary | ICD-10-CM

## 2012-07-27 DIAGNOSIS — C50419 Malignant neoplasm of upper-outer quadrant of unspecified female breast: Secondary | ICD-10-CM

## 2012-07-27 LAB — COMPREHENSIVE METABOLIC PANEL (CC13)
ALT: 14 U/L (ref 0–55)
AST: 19 U/L (ref 5–34)
Albumin: 4 g/dL (ref 3.5–5.0)
Alkaline Phosphatase: 62 U/L (ref 40–150)
Glucose: 98 mg/dl (ref 70–99)
Potassium: 4 mEq/L (ref 3.5–5.1)
Sodium: 137 mEq/L (ref 136–145)
Total Bilirubin: 0.95 mg/dL (ref 0.20–1.20)
Total Protein: 6.6 g/dL (ref 6.4–8.3)

## 2012-07-27 LAB — CBC WITH DIFFERENTIAL/PLATELET
BASO%: 0.2 % (ref 0.0–2.0)
Basophils Absolute: 0 10*3/uL (ref 0.0–0.1)
EOS%: 3.7 % (ref 0.0–7.0)
HGB: 12.5 g/dL (ref 11.6–15.9)
MCH: 32.7 pg (ref 25.1–34.0)
MCHC: 34.4 g/dL (ref 31.5–36.0)
MCV: 95 fL (ref 79.5–101.0)
MONO%: 7.6 % (ref 0.0–14.0)
RBC: 3.82 10*6/uL (ref 3.70–5.45)
RDW: 12.6 % (ref 11.2–14.5)
lymph#: 2.3 10*3/uL (ref 0.9–3.3)
nRBC: 0 % (ref 0–0)

## 2012-07-27 NOTE — Telephone Encounter (Signed)
Spoke with Norma [name] at [facility] - made aware we have attempted to get patient scheduled with an alternate MD but no one had OR time this week. She will speak to Dr Darnelle Catalan and they will get patient set up in IR. She will call me with any problems.

## 2012-07-27 NOTE — Progress Notes (Signed)
ID: Norma [name]   DOB: August 01, 1965  MR#: [id number]  WUJ#:[id number]  PCP: Selena Batten GYN: Gerald Leitz SU: Cicero Duck OTHER MD: Lurline Hare, Rogelia Mire, [place], Etter Sjogren   HISTORY OF PRESENT ILLNESS: Norma [name] had routine screening mammography at [facility] 11/16/2010 suggesting additional imaging on the right. This was performed the next day, and found no significant residual abnormality. Repeat screening mammography 03/15/2012 again suggested an area of architectural distortion in the right breast. In the same quadrant as before. Additional views 03/19/2012 found a 3 mm hypoechoic mass in the area in question. Biopsy of this mass 03/20/2012 showed an invasive ductal carcinoma, grade 1-2, triple negative, with an MIB-1 of 100%.  Breast MRI 03/23/2012 showed an area of poorly defined enhancement in the right breast associated with clip artifact. This measured 1 cm. In the left breast there was an enhancing mass measuring 2.0 cm. There were no other suspicious findings. The patient's subsequent history is as detailed below.  INTERVAL HISTORY: Norma [name] returns today for followup of her right breast cancer.  She is scheduled to initiate adjuvant chemotherapy on June 16.  She had her echocardiogram earlier this week which was grade, with an ejection fraction of 60%. Unfortunately, the biggest problem has been getting her scheduled for port placement prior to chemotherapy on Monday. This has yet to be scheduled, and she does not want to be treated peripherally.  Since her appointment here last week, Norma [name] has had all of her antinausea prescriptions filled. She has a medication organized her, and voices understanding of how to utilize all of these appropriately.   She is slightly anxious about starting chemotherapy, but is "ready to get started". She is looking forward to this weekend, with her oldest son graduating from high school tomorrow.  REVIEW OF SYSTEMS: Norma [name] denies any  fevers or chills.  She does have occasional hot flashes.  She denies any rashes and has had no abnormal bleeding. She's eating well denies nausea or change in bowel or bladder habits. She's had no cough, shortness of breath, chest pain, or palpitations. She's had no abnormal headaches or dizziness. She also currently denies any unusual myalgias, arthralgias, or bony pain. She's had no peripheral swelling.  A detailed review of systems is otherwise stable and noncontributory.    PAST MEDICAL HISTORY: Past Medical History  Diagnosis Date  . Breast cancer   . PONV (postoperative nausea and vomiting)   . Depression     PAST SURGICAL HISTORY: Past Surgical History  Procedure Laterality Date  . Anterior cruciate ligament repair  2010  . Endometrial ablation      2012  . Dilation and curettage of uterus    . Basal cell carcinoma excision      OFF NOSE  2011   . Simple mastectomy w/ sentinel node biopsy Bilateral 06/15/2012    Dr Jamey Ripa  . Simple mastectomy with axillary sentinel node biopsy Right 06/14/2012    Procedure: SIMPLE MASTECTOMY WITH AXILLARY SENTINEL NODE BIOPSY;  Surgeon: Currie Paris, MD;  Location: [facility] OR;  Service: General;  Laterality: Right;  . Total mastectomy Left 06/14/2012    Procedure: TOTAL MASTECTOMY;  Surgeon: Currie Paris, MD;  Location: [facility] OR;  Service: General;  Laterality: Left;  . Breast reconstruction with placement of tissue expander and flex hd (acellular hydrated dermis) Bilateral 06/14/2012    Procedure: BILATERAL BREAST RECONSTRUCTION WITH PLACEMENT OF BILATERAL TISSUE EXPANDERS;  Surgeon: Etter Sjogren, MD;  Location: [facility] OR;  Service: Plastics;  Laterality:  Bilateral;  Moh's surgery for basal cell, Left nares  FAMILY HISTORY Family History  Problem Relation Age of Onset  . Breast cancer Maternal Aunt     dx in her 63s  . Ovarian cancer Maternal Aunt     dx in her 3s  . Esophageal cancer Maternal Uncle   . Liver cancer Maternal Uncle   . Lung cancer Maternal  Uncle   . BRCA 1/2 Maternal Uncle     BRCA 2 +  . Breast cancer Cousin     dx in her 30s; BRCA2+  . Pancreatic cancer Maternal Uncle     dx in his 58s  . BRCA 1/2 Maternal Uncle     BRCA2+  . Colon cancer Maternal Uncle    the patient's parents are alive, in their early 1s. The patient has one brother and one sister. There is a significant family history of cancer and this includes one of the patient's mother is 2 sisters with ovarian and breast cancer. A first cousin of the patient had breast cancer in her 30s. She has been tested for the BRCA gene, but the patient does not know the results. There were also brothers of the patient's mother with esophageal pancreas and colon cancer. The patient has been scheduled for genetic testing.  GYNECOLOGIC HISTORY: Menarche age 6, first live birth age 23, she is GX P2. The patient's periods are increase in bili scans and irregular, but still ongoing. She did take birth control pills for approximately 10 years with no untoward events.  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 Her husband [name] is a Academic librarian. He owns his own business. He is originally from [place]. Their children are [name] 17 and [name] 14.     ADVANCED DIRECTIVES: Not in place. If both she and her husband become disabled and cannot make her own health care decisions, the patient intends to name her friend Mia Milan Scutari as healthcare power of attorney. Otherwise of course she and [name] will be each other self care power of attorney  HEALTH MAINTENANCE: History  Substance Use Topics  . Smoking status: Never Smoker   . Smokeless tobacco: Never Used  . Alcohol Use: Yes     Comment: daily     Colonoscopy:  PAP:  Bone density:  Lipid panel:  Allergies  Allergen Reactions  . Adhesive (Tape)     Pt doesn't like it  . Penicillins Nausea Only and Rash    Current Outpatient Prescriptions  Medication Sig Dispense  Refill  . buPROPion (WELLBUTRIN XL) 300 MG 24 hr tablet Take 300 mg by mouth daily.      Marland Kitchen dexamethasone (DECADRON) 4 MG tablet Take 2 tablets by mouth twice daily with food for 3 days after each chemo.  30 tablet  1  . escitalopram (LEXAPRO) 10 MG tablet Take 10 mg by mouth daily.      Marland Kitchen lidocaine-prilocaine (EMLA) cream Apply to port 1-2 hr before each procedure  30 g  2  . LORazepam (ATIVAN) 0.5 MG tablet Take 1 tablet (0.5 mg total) by mouth at bedtime as needed (anxiety, insomnia, or nausea).  30 tablet  0  . methocarbamol (ROBAXIN) 500 MG tablet Take 1 tablet (500 mg total) by mouth 4 (four) times daily.  40 tablet  1  . prochlorperazine (COMPAZINE) 10 MG tablet Take 1 tablet (10 mg total) by mouth every 6 (six) hours as needed (Nausea or vomiting).  30 tablet  1  . CALCIUM PO Take 1 tablet by mouth daily.       No current facility-administered medications for this visit.    OBJECTIVE: Middle-aged white woman in no acute distress Filed Vitals:   07/27/12 0857  BP: 117/74  Pulse: 66  Temp: 98.4 F (36.9 C)  Resp: 18     Body mass index is 28.02 kg/(m^2).    ECOG FS: 0 Filed Weights   07/27/12 0857  Weight: 173 lb 8 oz (78.699 kg)    Sclerae unicteric No cervical or supraclavicular adenopathy Lungs clear to auscultation bilaterally, no rales or rhonchi Heart regular rate and rhythm Abdomen soft, nontender to palpation, with positive bowel sounds MSK no focal spinal tenderness, no peripheral edema Neuro: nonfocal, well oriented, pleasant affect Breasts: Deferred.  Both axillae are benign, with no palpable adenopathy.    LAB RESULTS: Lab Results  Component Value Date   WBC 5.7 07/27/2012   NEUTROABS 2.7 07/27/2012   HGB 12.5 07/27/2012   HCT 36.3 07/27/2012   MCV 95.0 07/27/2012   PLT 256 07/27/2012      Chemistry      Component Value Date/Time   NA 137 07/27/2012 0806   NA 135 06/16/2012 0435   K 4.0 07/27/2012 0806   K 3.2* 06/16/2012 0435   CL 103 07/27/2012 0806   CL  101 06/16/2012 0435   CO2 24 07/27/2012 0806   CO2 27 06/16/2012 0435   BUN 11.6 07/27/2012 0806   BUN 4* 06/16/2012 0435   CREATININE 0.9 07/27/2012 0806   CREATININE 0.66 06/16/2012 0435      Component Value Date/Time   CALCIUM 9.3 07/27/2012 0806   CALCIUM 8.4 06/16/2012 0435   ALKPHOS 62 07/27/2012 0806   ALKPHOS 54 06/07/2012 1053   AST 19 07/27/2012 0806   AST 27 06/07/2012 1053   ALT 14 07/27/2012 0806   ALT 16 06/07/2012 1053   BILITOT 0.95 07/27/2012 0806   BILITOT 0.3 06/07/2012 1053       Lab Results  Component Value Date   LABCA2 23 03/28/2012     STUDIES:  Echocardiogram on 07/23/2012 showed an ejection fraction of 60%.    ASSESSMENT: 47 y.o. BRCA-2 positive Pura Spice woman   (1)  status post right breast biopsy 03/20/2012 for a clinical T1b N0, stage IA invasive ductal carcinoma, grade 1-2, triple negative, with an MIB-1 of 100%  (2) biopsy of a suspicious left breast mass was benign  (3) status post bilateral mastectomies with right sentinel lymph node sampling and immediate expander placement 06/14/2012, for a right-sided pT1c pN0, stage IA invasive ductal carcinoma, grade 1, with ample margins, triple negative, with an MIB-1 of 100%. (The left breast was benign)  (4)  being treated in the adjuvant setting, the plan being to complete 4 dose dense cycles of doxorubicin/cyclophosphamide followed by 4 dose dense cycles of paclitaxel.  (5) Patient will not need adjuvant radiation. Once she is done with active treatment for her breast cancer she will proceed to bilateral salpingo-oophorectomy at her and Dr. Dawayne Patricia discretion.   PLAN:  During our appointment today, we were able to get [facility] scheduled for a port placement with interventional radiology on Monday, June 16. Unfortunately, we will need to delay her first dose of chemotherapy by one day, making her first dose of adjuvant doxorubicin/cyclophosphamide on June 17. We will not repeat labs at that time. She'll return for  Neulasta on day 2, June 18, and I will see  her the following week for assessment of chemotoxicity.   We again reviewed Joni is and how nausea regimen as previously documented, including dexamethasone, prochlorperazine, and lorazepam. She voices understanding and agreement of all of the above, and knows to call any time with problems or questions.     Legacy Lacivita    07/27/2012

## 2012-07-27 NOTE — Telephone Encounter (Signed)
Message copied by Liliana Cline on Fri Jul 27, 2012  9:11 AM ------      Message from: Jari Sportsman      Created: Thu Jul 26, 2012 10:34 AM      Regarding: have not heard back        Dr Jamey Ripa I have not had any doctors available to do [facility] insertion on this patient. OR time is scarce really needed a DOW or LDOW dr to do.  Do you want to refer pt to IR?       Katie          ------

## 2012-07-30 ENCOUNTER — Other Ambulatory Visit: Payer: Self-pay | Admitting: Oncology

## 2012-07-30 ENCOUNTER — Ambulatory Visit (HOSPITAL_COMMUNITY)
Admission: RE | Admit: 2012-07-30 | Discharge: 2012-07-30 | Disposition: A | Discharge status: Home / Self Care | Payer: BC Managed Care – PPO | Source: Ambulatory Visit | Attending: Oncology | Admitting: Oncology

## 2012-07-30 ENCOUNTER — Ambulatory Visit: Payer: BC Managed Care – PPO

## 2012-07-30 ENCOUNTER — Other Ambulatory Visit: Payer: BC Managed Care – PPO

## 2012-07-30 ENCOUNTER — Encounter (HOSPITAL_COMMUNITY): Payer: Self-pay

## 2012-07-30 DIAGNOSIS — C50411 Malignant neoplasm of upper-outer quadrant of right female breast: Secondary | ICD-10-CM

## 2012-07-30 DIAGNOSIS — Z79899 Other long term (current) drug therapy: Secondary | ICD-10-CM | POA: Insufficient documentation

## 2012-07-30 DIAGNOSIS — C50919 Malignant neoplasm of unspecified site of unspecified female breast: Secondary | ICD-10-CM | POA: Insufficient documentation

## 2012-07-30 MED ORDER — VANCOMYCIN HCL IN DEXTROSE 1-5 GM/200ML-% IV SOLN
1000.0000 mg | INTRAVENOUS | Status: AC
  Administered 2012-07-30: 1000 mg via INTRAVENOUS
  Filled 2012-07-30: qty 200

## 2012-07-30 MED ORDER — FENTANYL CITRATE 0.05 MG/ML IJ SOLN
INTRAMUSCULAR | Status: AC
  Filled 2012-07-30: qty 6

## 2012-07-30 MED ORDER — MIDAZOLAM HCL 2 MG/2ML IJ SOLN
INTRAMUSCULAR | Status: AC
  Filled 2012-07-30: qty 6

## 2012-07-30 MED ORDER — MIDAZOLAM HCL 2 MG/2ML IJ SOLN
INTRAMUSCULAR | Status: AC | PRN
  Administered 2012-07-30 (×2): 2 mg via INTRAVENOUS

## 2012-07-30 MED ORDER — FENTANYL CITRATE 0.05 MG/ML IJ SOLN
INTRAMUSCULAR | Status: AC | PRN
  Administered 2012-07-30 (×2): 100 ug via INTRAVENOUS

## 2012-07-30 MED ORDER — FENTANYL CITRATE 0.05 MG/ML IJ SOLN
INTRAMUSCULAR | Status: AC
  Filled 2012-07-30: qty 4

## 2012-07-30 MED ORDER — LIDOCAINE HCL 1 % IJ SOLN
INTRAMUSCULAR | Status: AC
  Filled 2012-07-30: qty 20

## 2012-07-30 MED ORDER — MIDAZOLAM HCL 2 MG/2ML IJ SOLN
INTRAMUSCULAR | Status: AC
  Filled 2012-07-30: qty 4

## 2012-07-30 MED ORDER — HEPARIN SOD (PORK) LOCK FLUSH 100 UNIT/ML IV SOLN
500.0000 [IU] | Freq: Once | INTRAVENOUS | Status: AC
  Administered 2012-07-30: 500 [IU] via INTRAVENOUS

## 2012-07-30 MED ORDER — SODIUM CHLORIDE 0.9 % IV SOLN
INTRAVENOUS | Status: DC
  Administered 2012-07-30: 12:00:00 via INTRAVENOUS

## 2012-07-30 NOTE — H&P (Signed)
Agree 

## 2012-07-30 NOTE — Procedures (Signed)
Procedure:  Porta-cath Access:  Right IJ vein SL PAC with catheter tip at cavoatrial junction

## 2012-07-30 NOTE — H&P (Signed)
Chief Complaint: "I'm here for a port" Referring Physician:Magrinat HPI: Norma [name] is an 47 y.o. female with breast cancer who recently under went (B)mastectomies with right SLN bx. LN negative and (L)breast negative. She is to start chemo soon with no plans for radiation. She needs portacath. PMHx and meds reviewed.  Past Medical History:  Past Medical History  Diagnosis Date  . Breast cancer   . PONV (postoperative nausea and vomiting)   . Depression     Past Surgical History:  Past Surgical History  Procedure Laterality Date  . Anterior cruciate ligament repair  2010  . Endometrial ablation      2012  . Dilation and curettage of uterus    . Basal cell carcinoma excision      OFF NOSE  2011   . Simple mastectomy w/ sentinel node biopsy Bilateral 06/15/2012    Dr Jamey Ripa  . Simple mastectomy with axillary sentinel node biopsy Right 06/14/2012    Procedure: SIMPLE MASTECTOMY WITH AXILLARY SENTINEL NODE BIOPSY;  Surgeon: Currie Paris, MD;  Location: [facility] OR;  Service: General;  Laterality: Right;  . Total mastectomy Left 06/14/2012    Procedure: TOTAL MASTECTOMY;  Surgeon: Currie Paris, MD;  Location: [facility] OR;  Service: General;  Laterality: Left;  . Breast reconstruction with placement of tissue expander and flex hd (acellular hydrated dermis) Bilateral 06/14/2012    Procedure: BILATERAL BREAST RECONSTRUCTION WITH PLACEMENT OF BILATERAL TISSUE EXPANDERS;  Surgeon: Etter Sjogren, MD;  Location: [facility] OR;  Service: Plastics;  Laterality: Bilateral;    Family History:  Family History  Problem Relation Age of Onset  . Breast cancer Maternal Aunt     dx in her 77s  . Ovarian cancer Maternal Aunt     dx in her 11s  . Esophageal cancer Maternal Uncle   . Liver cancer Maternal Uncle   . Lung cancer Maternal Uncle   . BRCA 1/2 Maternal Uncle     BRCA 2 +  . Breast cancer Cousin     dx in her 30s; BRCA2+  . Pancreatic cancer Maternal Uncle     dx in his 35s  . BRCA 1/2  Maternal Uncle     BRCA2+  . Colon cancer Maternal Uncle     Social History:  reports that she has never smoked. She has never used smokeless tobacco. She reports that  drinks alcohol. She reports that she does not use illicit drugs.  Allergies:  Allergies  Allergen Reactions  . Adhesive (Tape)     Pt doesn't like it  . Penicillins Nausea Only and Rash    Medications: Current Outpatient Prescriptions   Medication  Sig  Dispense  Refill   .  buPROPion (WELLBUTRIN XL) 300 MG 24 hr tablet  Take 300 mg by mouth daily.     Marland Kitchen  dexamethasone (DECADRON) 4 MG tablet  Take 2 tablets by mouth twice daily with food for 3 days after each chemo.  30 tablet  1   .  escitalopram (LEXAPRO) 10 MG tablet  Take 10 mg by mouth daily.     Marland Kitchen  lidocaine-prilocaine (EMLA) cream  Apply to port 1-2 hr before each procedure  30 g  2   .  LORazepam (ATIVAN) 0.5 MG tablet  Take 1 tablet (0.5 mg total) by mouth at bedtime as needed (anxiety, insomnia, or nausea).  30 tablet  0   .  methocarbamol (ROBAXIN) 500 MG tablet  Take 1 tablet (500 mg total) by mouth  4 (four) times daily.  40 tablet  1   .  prochlorperazine (COMPAZINE) 10 MG tablet  Take 1 tablet (10 mg total) by mouth every 6 (six) hours as needed (Nausea or vomiting).  30 tablet  1   .  CALCIUM PO  Take 1 tablet by mouth daily       Please HPI for pertinent positives, otherwise complete 10 system ROS negative.  Physical Exam: Temp: 98.4, HR: 66, RR: 16, BP: 121/75, O2: 100%   General Appearance:  Alert, cooperative, no distress, appears stated age  Head:  Normocephalic, without obvious abnormality, atraumatic  ENT: Unremarkable  Neck: Supple, symmetrical, trachea midline  Lungs:   Clear to auscultation bilaterally, no w/r/r, respirations unlabored without use of accessory muscles.  Chest Wall:  (B)mastectomy scars healing well. Tissue expanders palpable, mildly tender L>R  Heart:  Regular rate and rhythm, S1, S2 normal, no murmur, rub or gallop.   Neurologic: Normal affect, no gross deficits.   Results for orders placed during the hospital encounter of 07/30/12 (from the past 48 hour(s))  APTT     Status: None   Collection Time    07/30/12 11:50 AM      Result Value Range   aPTT 30  24 - 37 seconds  PROTIME-INR     Status: None   Collection Time    07/30/12 11:50 AM      Result Value Range   Prothrombin Time 12.4  11.6 - 15.2 seconds   INR 0.93  0.00 - 1.49   CBC    Component Value Date/Time   WBC 5.7 07/27/2012 0806   RBC 3.82 07/27/2012 0806   HGB 12.5 07/27/2012 0806   HCT 36.3 07/27/2012 0806   PLT 256 07/27/2012 0806    Assessment/Plan Breast cancer For portacath placement Discussed procedure, risks, complications, use of sedation. Labs reviewed, ok Consent signed in chart  Brayton El PA-C 07/30/2012, 12:59 PM

## 2012-07-31 ENCOUNTER — Ambulatory Visit: Payer: BC Managed Care – PPO

## 2012-07-31 ENCOUNTER — Ambulatory Visit (HOSPITAL_BASED_OUTPATIENT_CLINIC_OR_DEPARTMENT_OTHER): Payer: BC Managed Care – PPO

## 2012-07-31 ENCOUNTER — Other Ambulatory Visit: Payer: Self-pay | Admitting: Oncology

## 2012-07-31 VITALS — BP 111/71 | HR 67 | Temp 98.0°F

## 2012-07-31 DIAGNOSIS — Z1501 Genetic susceptibility to malignant neoplasm of breast: Secondary | ICD-10-CM

## 2012-07-31 DIAGNOSIS — Z5111 Encounter for antineoplastic chemotherapy: Secondary | ICD-10-CM

## 2012-07-31 DIAGNOSIS — C50411 Malignant neoplasm of upper-outer quadrant of right female breast: Secondary | ICD-10-CM

## 2012-07-31 DIAGNOSIS — C50919 Malignant neoplasm of unspecified site of unspecified female breast: Secondary | ICD-10-CM

## 2012-07-31 MED ORDER — PALONOSETRON HCL INJECTION 0.25 MG/5ML
0.2500 mg | Freq: Once | INTRAVENOUS | Status: AC
Start: 2012-07-31 — End: 2012-07-31
  Administered 2012-07-31: 0.25 mg via INTRAVENOUS

## 2012-07-31 MED ORDER — SODIUM CHLORIDE 0.9 % IV SOLN
Freq: Once | INTRAVENOUS | Status: AC
  Administered 2012-07-31: 09:00:00 via INTRAVENOUS

## 2012-07-31 MED ORDER — HEPARIN SOD (PORK) LOCK FLUSH 100 UNIT/ML IV SOLN
500.0000 [IU] | Freq: Once | INTRAVENOUS | Status: AC | PRN
  Administered 2012-07-31: 500 [IU]
  Filled 2012-07-31: qty 5

## 2012-07-31 MED ORDER — DEXAMETHASONE SODIUM PHOSPHATE 20 MG/5ML IJ SOLN
12.0000 mg | Freq: Once | INTRAMUSCULAR | Status: AC
  Administered 2012-07-31: 12 mg via INTRAVENOUS

## 2012-07-31 MED ORDER — SODIUM CHLORIDE 0.9 % IJ SOLN
10.0000 mL | INTRAMUSCULAR | Status: DC | PRN
  Administered 2012-07-31: 10 mL
  Filled 2012-07-31: qty 10

## 2012-07-31 MED ORDER — SODIUM CHLORIDE 0.9 % IV SOLN
600.0000 mg/m2 | Freq: Once | INTRAVENOUS | Status: AC
  Administered 2012-07-31: 1140 mg via INTRAVENOUS
  Filled 2012-07-31: qty 57

## 2012-07-31 MED ORDER — SODIUM CHLORIDE 0.9 % IV SOLN
150.0000 mg | Freq: Once | INTRAVENOUS | Status: AC
  Administered 2012-07-31: 150 mg via INTRAVENOUS
  Filled 2012-07-31: qty 5

## 2012-07-31 MED ORDER — DOXORUBICIN HCL CHEMO IV INJECTION 2 MG/ML
60.0000 mg/m2 | Freq: Once | INTRAVENOUS | Status: AC
  Administered 2012-07-31: 114 mg via INTRAVENOUS
  Filled 2012-07-31: qty 57

## 2012-07-31 NOTE — Progress Notes (Signed)
Positive blood return noted before, every 3 ml while pushing and after Adriamycin push. Normal saline free flowing.

## 2012-07-31 NOTE — Patient Instructions (Addendum)
[facility] Discharge Instructions for Patients Receiving Chemotherapy  Today you received the following chemotherapy agents Adriamycin/Cytoxan To help prevent nausea and vomiting after your treatment, we encourage you to take your nausea medication as prescribed.If you develop nausea and vomiting that is not controlled by your nausea medication, call the clinic.   BELOW ARE SYMPTOMS THAT SHOULD BE REPORTED IMMEDIATELY:  *FEVER GREATER THAN 100.5 F  *CHILLS WITH OR WITHOUT FEVER  NAUSEA AND VOMITING THAT IS NOT CONTROLLED WITH YOUR NAUSEA MEDICATION  *UNUSUAL SHORTNESS OF BREATH  *UNUSUAL BRUISING OR BLEEDING  TENDERNESS IN MOUTH AND THROAT WITH OR WITHOUT PRESENCE OF ULCERS  *URINARY PROBLEMS  *BOWEL PROBLEMS  UNUSUAL RASH Items with * indicate a potential emergency and should be followed up as soon as possible.  Feel free to call the clinic you have any questions or concerns. The clinic phone number is [phone].   Doxorubicin injection (Adriamycin) What is this medicine? DOXORUBICIN (dox oh ROO bi sin) is a chemotherapy drug. It is used to treat many kinds of cancer like Hodgkin's disease, leukemia, non-Hodgkin's lymphoma, neuroblastoma, sarcoma, and Wilms' tumor. It is also used to treat bladder cancer, breast cancer, lung cancer, ovarian cancer, stomach cancer, and thyroid cancer. This medicine may be used for other purposes; ask your health care provider or pharmacist if you have questions. What should I tell my health care provider before I take this medicine? They need to know if you have any of these conditions: -blood disorders -heart disease, recent heart attack -infection (especially a virus infection such as chickenpox, cold sores, or herpes) -irregular heartbeat -liver disease -recent or ongoing radiation therapy -an unusual or allergic reaction to doxorubicin, other chemotherapy agents, other medicines, foods, dyes, or  preservatives -pregnant or trying to get pregnant -breast-feeding How should I use this medicine? This drug is given as an infusion into a vein. It is administered in a hospital or clinic by a specially trained health care professional. If you have pain, swelling, burning or any unusual feeling around the site of your injection, tell your health care professional right away. Talk to your pediatrician regarding the use of this medicine in children. Special care may be needed. Overdosage: If you think you have taken too much of this medicine contact a poison control center or emergency room at once. NOTE: This medicine is only for you. Do not share this medicine with others. What if I miss a dose? It is important not to miss your dose. Call your doctor or health care professional if you are unable to keep an appointment. What may interact with this medicine? Do not take this medicine with any of the following medications: -cisapride -droperidol -halofantrine -pimozide -zidovudine This medicine may also interact with the following medications: -chloroquine -chlorpromazine -clarithromycin -cyclophosphamide -cyclosporine -erythromycin -medicines for depression, anxiety, or psychotic disturbances -medicines for irregular heart beat like amiodarone, bepridil, dofetilide, encainide, flecainide, propafenone, quinidine -medicines for seizures like ethotoin, fosphenytoin, phenytoin -medicines for nausea, vomiting like dolasetron, ondansetron, palonosetron -medicines to increase blood counts like filgrastim, pegfilgrastim, sargramostim -methadone -methotrexate -pentamidine -progesterone -vaccines -verapamil Talk to your doctor or health care professional before taking any of these medicines: -acetaminophen -aspirin -ibuprofen -ketoprofen -naproxen This list may not describe all possible interactions. Give your health care provider a list of all the medicines, herbs, non-prescription  drugs, or dietary supplements you use. Also tell them if you smoke, drink alcohol, or use illegal drugs. Some items may interact with your medicine. What should I  watch for while using this medicine? Your condition will be monitored carefully while you are receiving this medicine. You will need important blood work done while you are taking this medicine. This drug may make you feel generally unwell. This is not uncommon, as chemotherapy can affect healthy cells as well as cancer cells. Report any side effects. Continue your course of treatment even though you feel ill unless your doctor tells you to stop. Your urine may turn red for a few days after your dose. This is not blood. If your urine is dark or brown, call your doctor. In some cases, you may be given additional medicines to help with side effects. Follow all directions for their use. Call your doctor or health care professional for advice if you get a fever, chills or sore throat, or other symptoms of a cold or flu. Do not treat yourself. This drug decreases your body's ability to fight infections. Try to avoid being around people who are sick. This medicine may increase your risk to bruise or bleed. Call your doctor or health care professional if you notice any unusual bleeding. Be careful brushing and flossing your teeth or using a toothpick because you may get an infection or bleed more easily. If you have any dental work done, tell your dentist you are receiving this medicine. Avoid taking products that contain aspirin, acetaminophen, ibuprofen, naproxen, or ketoprofen unless instructed by your doctor. These medicines may hide a fever. Men and women of childbearing age should use effective birth control methods while using taking this medicine. Do not become pregnant while taking this medicine. There is a potential for serious side effects to an unborn child. Talk to your health care professional or pharmacist for more information. Do not  breast-feed an infant while taking this medicine. Do not let others touch your urine or other body fluids for 5 days after each treatment with this medicine. Caregivers should wear latex gloves to avoid touching body fluids during this time. What side effects may I notice from receiving this medicine? Side effects that you should report to your doctor or health care professional as soon as possible: -allergic reactions like skin rash, itching or hives, swelling of the face, lips, or tongue -low blood counts - this medicine may decrease the number of white blood cells, red blood cells and platelets. You may be at increased risk for infections and bleeding. -signs of infection - fever or chills, cough, sore throat, pain or difficulty passing urine -signs of decreased platelets or bleeding - bruising, pinpoint red spots on the skin, black, tarry stools, blood in the urine -signs of decreased red blood cells - unusually weak or tired, fainting spells, lightheadedness -breathing problems -chest pain -fast, irregular heartbeat -mouth sores -nausea, vomiting -pain, swelling, redness at site where injected -pain, tingling, numbness in the hands or feet -swelling of ankles, feet, or hands -unusual bleeding or bruising Side effects that usually do not require medical attention (report to your doctor or health care professional if they continue or are bothersome): -diarrhea -facial flushing -hair loss -loss of appetite -missed menstrual periods -nail discoloration or damage -red or watery eyes -red colored urine -stomach upset This list may not describe all possible side effects. Call your doctor for medical advice about side effects. You may report side effects to FDA at 1-800-FDA-1088. Where should I keep my medicine? This drug is given in a hospital or clinic and will not be stored at home. NOTE: This sheet is a summary.  It may not cover all possible information. If you have questions about  this medicine, talk to your doctor, pharmacist, or health care provider.  2013, [employer] Standard. (05/22/2007 5:07:32 PM)   Cyclophosphamide injection (Cytoxan) What is this medicine? CYCLOPHOSPHAMIDE (sye kloe FOSS fa mide) is a chemotherapy drug. It slows the growth of cancer cells. This medicine is used to treat many types of cancer like lymphoma, myeloma, leukemia, breast cancer, and ovarian cancer, to name a few. It is also used to treat nephrotic syndrome in children. This medicine may be used for other purposes; ask your health care provider or pharmacist if you have questions. What should I tell my health care provider before I take this medicine? They need to know if you have any of these conditions: -blood disorders -history of other chemotherapy -history of radiation therapy -infection -kidney disease -liver disease -tumors in the bone marrow -an unusual or allergic reaction to cyclophosphamide, other chemotherapy, other medicines, foods, dyes, or preservatives -pregnant or trying to get pregnant -breast-feeding How should I use this medicine? This drug is usually given as an injection into a vein or muscle or by infusion into a vein. It is administered in a hospital or clinic by a specially trained health care professional. Talk to your pediatrician regarding the use of this medicine in children. While this drug may be prescribed for selected conditions, precautions do apply. Overdosage: If you think you have taken too much of this medicine contact a poison control center or emergency room at once. NOTE: This medicine is only for you. Do not share this medicine with others. What if I miss a dose? It is important not to miss your dose. Call your doctor or health care professional if you are unable to keep an appointment. What may interact with this medicine? Do not take this medicine with any of the following medications: -mibefradil -nalidixic acid This medicine may  also interact with the following medications: -doxorubicin -etanercept -medicines to increase blood counts like filgrastim, pegfilgrastim, sargramostim -medicines that block muscle or nerve pain -St. John's Wort -phenobarbital -succinylcholine chloride -trastuzumab -vaccines Talk to your doctor or health care professional before taking any of these medicines: -acetaminophen -aspirin -ibuprofen -ketoprofen -naproxen This list may not describe all possible interactions. Give your health care provider a list of all the medicines, herbs, non-prescription drugs, or dietary supplements you use. Also tell them if you smoke, drink alcohol, or use illegal drugs. Some items may interact with your medicine. What should I watch for while using this medicine? Visit your doctor for checks on your progress. This drug may make you feel generally unwell. This is not uncommon, as chemotherapy can affect healthy cells as well as cancer cells. Report any side effects. Continue your course of treatment even though you feel ill unless your doctor tells you to stop. Drink water or other fluids as directed. Urinate often, even at night. In some cases, you may be given additional medicines to help with side effects. Follow all directions for their use. Call your doctor or health care professional for advice if you get a fever, chills or sore throat, or other symptoms of a cold or flu. Do not treat yourself. This drug decreases your body's ability to fight infections. Try to avoid being around people who are sick. This medicine may increase your risk to bruise or bleed. Call your doctor or health care professional if you notice any unusual bleeding. Be careful brushing and flossing your teeth or using a toothpick  because you may get an infection or bleed more easily. If you have any dental work done, tell your dentist you are receiving this medicine. Avoid taking products that contain aspirin, acetaminophen,  ibuprofen, naproxen, or ketoprofen unless instructed by your doctor. These medicines may hide a fever. Do not become pregnant while taking this medicine. Women should inform their doctor if they wish to become pregnant or think they might be pregnant. There is a potential for serious side effects to an unborn child. Talk to your health care professional or pharmacist for more information. Do not breast-feed an infant while taking this medicine. Men should inform their doctor if they wish to father a child. This medicine may lower sperm counts. If you are going to have surgery, tell your doctor or health care professional that you have taken this medicine. What side effects may I notice from receiving this medicine? Side effects that you should report to your doctor or health care professional as soon as possible: -allergic reactions like skin rash, itching or hives, swelling of the face, lips, or tongue -low blood counts - this medicine may decrease the number of white blood cells, red blood cells and platelets. You may be at increased risk for infections and bleeding. -signs of infection - fever or chills, cough, sore throat, pain or difficulty passing urine -signs of decreased platelets or bleeding - bruising, pinpoint red spots on the skin, black, tarry stools, blood in the urine -signs of decreased red blood cells - unusually weak or tired, fainting spells, lightheadedness -breathing problems -dark urine -mouth sores -pain, swelling, redness at site where injected -swelling of the ankles, feet, hands -trouble passing urine or change in the amount of urine -weight gain -yellowing of the eyes or skin Side effects that usually do not require medical attention (report to your doctor or health care professional if they continue or are bothersome): -changes in nail or skin color -diarrhea -hair loss -loss of appetite -missed menstrual periods -nausea, vomiting -stomach pain This list may not  describe all possible side effects. Call your doctor for medical advice about side effects. You may report side effects to FDA at 1-800-FDA-1088. Where should I keep my medicine? This drug is given in a hospital or clinic and will not be stored at home. NOTE: This sheet is a summary. It may not cover all possible information. If you have questions about this medicine, talk to your doctor, pharmacist, or health care provider.  2013, [employer] Standard. (05/08/2007 2:32:25 PM)

## 2012-08-01 ENCOUNTER — Ambulatory Visit (HOSPITAL_BASED_OUTPATIENT_CLINIC_OR_DEPARTMENT_OTHER): Payer: BC Managed Care – PPO

## 2012-08-01 ENCOUNTER — Telehealth: Payer: Self-pay | Admitting: *Deleted

## 2012-08-01 ENCOUNTER — Other Ambulatory Visit: Payer: Self-pay | Admitting: Physician Assistant

## 2012-08-01 VITALS — BP 97/54 | HR 73 | Temp 98.2°F

## 2012-08-01 DIAGNOSIS — C50411 Malignant neoplasm of upper-outer quadrant of right female breast: Secondary | ICD-10-CM

## 2012-08-01 DIAGNOSIS — C50919 Malignant neoplasm of unspecified site of unspecified female breast: Secondary | ICD-10-CM

## 2012-08-01 DIAGNOSIS — Z1509 Genetic susceptibility to other malignant neoplasm: Secondary | ICD-10-CM

## 2012-08-01 DIAGNOSIS — Z5189 Encounter for other specified aftercare: Secondary | ICD-10-CM

## 2012-08-01 MED ORDER — LORAZEPAM 1 MG PO TABS
0.5000 mg | ORAL_TABLET | Freq: Once | ORAL | Status: AC
  Administered 2012-08-01: 0.5 mg via ORAL

## 2012-08-01 MED ORDER — PEGFILGRASTIM INJECTION 6 MG/0.6ML
6.0000 mg | Freq: Once | SUBCUTANEOUS | Status: AC
  Administered 2012-08-01: 6 mg via SUBCUTANEOUS
  Filled 2012-08-01: qty 0.6

## 2012-08-01 MED ORDER — METOCLOPRAMIDE HCL 10 MG PO TABS: 10.0000 mg | ORAL_TABLET | Freq: Four times a day (QID) | ORAL | Status: DC

## 2012-08-01 NOTE — Patient Instructions (Addendum)

## 2012-08-01 NOTE — Telephone Encounter (Signed)
Norma [name] here for Neulasta injection following 1st [facility] chemotherapy treatment.  Has been having a lot of nausea and vomiting last night.  She did call the on call MD and received instruction with another antiemetic.  Still continues to have nausea today.  Spoke with Zollie Scale, NP today and gave more instructions as to how to treat the nausea.  She is trying to drink but it has been difficult.  She knows to continue to get in more fluids, and if she doesn't feel any better by this afternoon, she is to call us again.

## 2012-08-01 NOTE — Progress Notes (Signed)
Yasemin complaining of nausea today.  She states that she started vomiting yesterday about one hour after getting home.  Took compazine without much relief and also used the Lorazepam.  When she continued to vomit she called the on call MD who called in a Rx for Zofran which has helped some, but continues to be nauseated this morning.   Spoke with Zollie Scale, NP who ordered Lorazepam 0.5mg  now and called in to the pharmacy some Reglan to use.  To use Reglan or Compazine as needed.   Also needs to drink  Patient and her sister state and repeated the instructions.  All questions answered.

## 2012-08-06 ENCOUNTER — Other Ambulatory Visit: Payer: Self-pay | Admitting: Physician Assistant

## 2012-08-06 DIAGNOSIS — C50919 Malignant neoplasm of unspecified site of unspecified female breast: Secondary | ICD-10-CM

## 2012-08-07 ENCOUNTER — Other Ambulatory Visit (HOSPITAL_BASED_OUTPATIENT_CLINIC_OR_DEPARTMENT_OTHER): Payer: BC Managed Care – PPO | Admitting: Lab

## 2012-08-07 ENCOUNTER — Other Ambulatory Visit: Payer: Self-pay | Admitting: *Deleted

## 2012-08-07 ENCOUNTER — Encounter: Payer: Self-pay | Admitting: Physician Assistant

## 2012-08-07 ENCOUNTER — Ambulatory Visit (HOSPITAL_BASED_OUTPATIENT_CLINIC_OR_DEPARTMENT_OTHER): Payer: BC Managed Care – PPO | Admitting: Physician Assistant

## 2012-08-07 ENCOUNTER — Telehealth: Payer: Self-pay | Admitting: Oncology

## 2012-08-07 ENCOUNTER — Telehealth: Payer: Self-pay | Admitting: *Deleted

## 2012-08-07 VITALS — BP 104/70 | HR 71 | Temp 98.5°F | Resp 20 | Ht 66.0 in | Wt 170.0 lb

## 2012-08-07 DIAGNOSIS — C50419 Malignant neoplasm of upper-outer quadrant of unspecified female breast: Secondary | ICD-10-CM

## 2012-08-07 DIAGNOSIS — Z1501 Genetic susceptibility to malignant neoplasm of breast: Secondary | ICD-10-CM

## 2012-08-07 DIAGNOSIS — Z171 Estrogen receptor negative status [ER-]: Secondary | ICD-10-CM

## 2012-08-07 DIAGNOSIS — R112 Nausea with vomiting, unspecified: Secondary | ICD-10-CM

## 2012-08-07 DIAGNOSIS — C50411 Malignant neoplasm of upper-outer quadrant of right female breast: Secondary | ICD-10-CM

## 2012-08-07 DIAGNOSIS — C50919 Malignant neoplasm of unspecified site of unspecified female breast: Secondary | ICD-10-CM

## 2012-08-07 DIAGNOSIS — K1231 Oral mucositis (ulcerative) due to antineoplastic therapy: Secondary | ICD-10-CM

## 2012-08-07 LAB — CBC WITH DIFFERENTIAL/PLATELET
Basophils Absolute: 0 10*3/uL (ref 0.0–0.1)
EOS%: 8.3 % — ABNORMAL HIGH (ref 0.0–7.0)
Eosinophils Absolute: 0.1 10*3/uL (ref 0.0–0.5)
HCT: 31.3 % — ABNORMAL LOW (ref 34.8–46.6)
HGB: 11 g/dL — ABNORMAL LOW (ref 11.6–15.9)
MCH: 32.4 pg (ref 25.1–34.0)
MCV: 92.1 fL (ref 79.5–101.0)
MONO%: 6.3 % (ref 0.0–14.0)
NEUT#: 0.1 10*3/uL — CL (ref 1.5–6.5)
NEUT%: 6.3 % — ABNORMAL LOW (ref 38.4–76.8)
Platelets: 128 10*3/uL — ABNORMAL LOW (ref 145–400)
RDW: 11.9 % (ref 11.2–14.5)

## 2012-08-07 LAB — COMPREHENSIVE METABOLIC PANEL (CC13)
ALT: 11 U/L (ref 0–55)
Alkaline Phosphatase: 53 U/L (ref 40–150)
Creatinine: 0.7 mg/dL (ref 0.6–1.1)
Sodium: 138 mEq/L (ref 136–145)
Total Bilirubin: 0.68 mg/dL (ref 0.20–1.20)
Total Protein: 6.2 g/dL — ABNORMAL LOW (ref 6.4–8.3)

## 2012-08-07 MED ORDER — MAGIC MOUTHWASH W/LIDOCAINE: 5.0000 mL | Freq: Four times a day (QID) | ORAL | Status: DC | PRN

## 2012-08-07 MED ORDER — CEPHALEXIN 500 MG PO CAPS: 500.0000 mg | ORAL_CAPSULE | Freq: Two times a day (BID) | ORAL | Status: DC

## 2012-08-07 NOTE — Progress Notes (Signed)
ID: Norma [name]   DOB: 11/26/1965  MR#: [id number]  NWG#:[id number]  PCP: Selena Batten GYN: Gerald Leitz SU: Cicero Duck OTHER MD: Lurline Hare, Rogelia Mire, Levasy, Norma [name]   HISTORY OF PRESENT ILLNESS: Norma [name] had routine screening mammography at [facility] 11/16/2010 suggesting additional imaging on the right. This was performed the next day, and found no significant residual abnormality. Repeat screening mammography 03/15/2012 again suggested an area of architectural distortion in the right breast. In the same quadrant as before. Additional views 03/19/2012 found a 3 mm hypoechoic mass in the area in question. Biopsy of this mass 03/20/2012 showed an invasive ductal carcinoma, grade 1-2, triple negative, with an MIB-1 of 100%.  Breast MRI 03/23/2012 showed an area of poorly defined enhancement in the right breast associated with clip artifact. This measured 1 cm. In the left breast there was an enhancing mass measuring 2.0 cm. There were no other suspicious findings. The patient's subsequent history is as detailed below.  INTERVAL HISTORY: Norma [name] returns today accompanied by her sister, Norma [name], for followup of her right breast cancer.  She is currently day 8 cycle 1 of 4 planned dose dense cycles of doxorubicin/cyclophosphamide being given in the adjuvant setting. She also received Neulasta on day 2 for granulocyte support.  Norma [name] began having nausea and emesis just a few hours following her chemotherapy last week. In fact, her sister contacted our physician on call and [name] was started on ondansetron on Tuesday night following treatment. She did find this helpful, although she has developed some headaches which are likely associated. She also felt some relief after taking the prochlorperazine. The lorazepam helped with the nausea, and also helped her sleep. She tolerated the dexamethasone well.  Fortunately, she began feeling better after approximately 24 hours. She is now eating and  drinking better, currently has no nausea, and is feeling much better.   REVIEW OF SYSTEMS: Norma [name] denies any  fevers or chills, but has occasional hot flashes.  She denies any rashes and has had no abnormal bleeding. She has some oral ulcerations inside the lower lip. She's eating fairly well, but does have some loose bowel movements after eating. She's had no blood or mucus in the stool.  She's had no cough, shortness of breath, chest pain, or palpitations. She's had no dizziness or change in vision. She also currently denies any unusual myalgias, arthralgias, or bony pain. She's had no peripheral swelling.  A detailed review of systems is otherwise stable and noncontributory.    PAST MEDICAL HISTORY: Past Medical History  Diagnosis Date  . Breast cancer   . PONV (postoperative nausea and vomiting)   . Depression     PAST SURGICAL HISTORY: Past Surgical History  Procedure Laterality Date  . Anterior cruciate ligament repair  2010  . Endometrial ablation      2012  . Dilation and curettage of uterus    . Basal cell carcinoma excision      OFF NOSE  2011   . Simple mastectomy w/ sentinel node biopsy Bilateral 06/15/2012    Dr Jamey Norma [name]  . Simple mastectomy with axillary sentinel node biopsy Right 06/14/2012    Procedure: SIMPLE MASTECTOMY WITH AXILLARY SENTINEL NODE BIOPSY;  Surgeon: Norma Paris, MD;  Location: [facility] OR;  Service: General;  Laterality: Right;  . Total mastectomy Left 06/14/2012    Procedure: TOTAL MASTECTOMY;  Surgeon: Norma Paris, MD;  Location: [facility] OR;  Service: General;  Laterality: Left;  . Breast reconstruction with placement of tissue  expander and flex hd (acellular hydrated dermis) Bilateral 06/14/2012    Procedure: BILATERAL BREAST RECONSTRUCTION WITH PLACEMENT OF BILATERAL TISSUE EXPANDERS;  Surgeon: Norma Sjogren, MD;  Location: [facility] OR;  Service: Plastics;  Laterality: Bilateral;  Moh's surgery for basal cell, Left nares  FAMILY HISTORY Family History   Problem Relation Age of Onset  . Breast cancer Maternal Aunt     dx in her 48s  . Ovarian cancer Maternal Aunt     dx in her 24s  . Esophageal cancer Maternal Uncle   . Liver cancer Maternal Uncle   . Lung cancer Maternal Uncle   . BRCA 1/2 Maternal Uncle     BRCA 2 +  . Breast cancer Cousin     dx in her 30s; BRCA2+  . Pancreatic cancer Maternal Uncle     dx in his 63s  . BRCA 1/2 Maternal Uncle     BRCA2+  . Colon cancer Maternal Uncle    the patient's parents are alive, in their early 22s. The patient has one brother and one sister. There is a significant family history of cancer and this includes one of the patient's mother is 2 sisters with ovarian and breast cancer. A first cousin of the patient had breast cancer in her 30s. She has been tested for the BRCA gene, but the patient does not know the results. There were also brothers of the patient's mother with esophageal pancreas and colon cancer. The patient has been scheduled for genetic testing.  GYNECOLOGIC HISTORY: Menarche age 62, first live birth age 45, she is GX P2. The patient's periods are increase in bili scans and irregular, but still ongoing. She did take birth control pills for approximately 10 years with no untoward events.  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 Her husband [name] is a Academic librarian. He owns his own business. He is originally from [place]. Their children are Norma [name] 17 and Norma [name] 14.     ADVANCED DIRECTIVES: Not in place. If both she and her husband become disabled and cannot make her own health care decisions, the patient intends to name her friend Norma [name] as healthcare power of attorney. Otherwise of course she and [name] will be each other self care power of attorney  HEALTH MAINTENANCE: History  Substance Use Topics  . Smoking status: Never Smoker   . Smokeless tobacco: Never Used  . Alcohol Use: Yes     Comment: daily      Colonoscopy:  PAP:  Bone density:  Lipid panel:  Allergies  Allergen Reactions  . Adhesive (Tape)     Pt doesn't like it  . Penicillins Nausea Only and Rash    Current Outpatient Prescriptions  Medication Sig Dispense Refill  . buPROPion (WELLBUTRIN XL) 300 MG 24 hr tablet Take 300 mg by mouth every morning.       Marland Kitchen dexamethasone (DECADRON) 4 MG tablet Take 2 tablets by mouth twice daily with food for 3 days after each chemo.  30 tablet  1  . escitalopram (LEXAPRO) 20 MG tablet Take 10 mg by mouth every morning. Takes 1/2 tablet      . lidocaine-prilocaine (EMLA) cream Apply to port 1-2 hr before each procedure  30 g  2  . LORazepam (ATIVAN) 0.5 MG tablet Take 1 tablet (0.5 mg total) by mouth at bedtime as needed (anxiety, insomnia, or nausea).  30 tablet  0  . methocarbamol (ROBAXIN) 500 MG tablet  Take 1 tablet (500 mg total) by mouth 4 (four) times daily.  40 tablet  1  . metoCLOPramide (REGLAN) 10 MG tablet Take 1 tablet (10 mg total) by mouth 4 (four) times daily. As needed for nausea  30 tablet  2  . ondansetron (ZOFRAN-ODT) 8 MG disintegrating tablet Take 8 mg by mouth every 8 (eight) hours as needed for nausea.      . prochlorperazine (COMPAZINE) 10 MG tablet Take 1 tablet (10 mg total) by mouth every 6 (six) hours as needed (Nausea or vomiting).  30 tablet  1  . Alum & Mag Hydroxide-Simeth (MAGIC MOUTHWASH W/LIDOCAINE) SOLN Take 5 mLs by mouth 4 (four) times daily as needed (mouth pain).  240 mL  2   No current facility-administered medications for this visit.    OBJECTIVE: Middle-aged white woman  in no acute distress Filed Vitals:   08/07/12 0831  BP: 104/70  Pulse: 71  Temp: 98.5 F (36.9 C)  Resp: 20     Body mass index is 27.45 kg/(m^2).    ECOG FS: 0 Filed Weights   08/07/12 0831  Weight: 170 lb (77.111 kg)    Sclerae unicteric Oropharynx is notable for several small ulcerations inside the lower lip. No cervical or supraclavicular adenopathy Lungs  clear to auscultation bilaterally, no rales or rhonchi Heart regular rate and rhythm Abdomen soft, nontender to palpation, with positive bowel sounds MSK no focal spinal tenderness, no peripheral edema Neuro: nonfocal, well oriented, pleasant affect Breasts: Patient status post bilateral mastectomies.  Both axillae are benign, with no palpable adenopathy. Port is intact in the right upper chest wall with no erythema or edema.   LAB RESULTS: Lab Results  Component Value Date   WBC 1.0 confirmed* 08/07/2012   NEUTROABS 0.1 confirmed * 08/07/2012   HGB 11.0* 08/07/2012   HCT 31.3* 08/07/2012   MCV 92.1 08/07/2012   PLT 128 confirmed * 08/07/2012      Chemistry      Component Value Date/Time   NA 138 08/07/2012 0808   NA 135 06/16/2012 0435   K 3.6 08/07/2012 0808   K 3.2* 06/16/2012 0435   CL 105 08/07/2012 0808   CL 101 06/16/2012 0435   CO2 24 08/07/2012 0808   CO2 27 06/16/2012 0435   BUN 10.2 08/07/2012 0808   BUN 4* 06/16/2012 0435   CREATININE 0.7 08/07/2012 0808   CREATININE 0.66 06/16/2012 0435      Component Value Date/Time   CALCIUM 9.4 08/07/2012 0808   CALCIUM 8.4 06/16/2012 0435   ALKPHOS 53 08/07/2012 0808   ALKPHOS 54 06/07/2012 1053   AST 12 08/07/2012 0808   AST 27 06/07/2012 1053   ALT 11 08/07/2012 0808   ALT 16 06/07/2012 1053   BILITOT 0.68 08/07/2012 0808   BILITOT 0.3 06/07/2012 1053       Lab Results  Component Value Date   LABCA2 23 03/28/2012     STUDIES:  Echocardiogram on 07/23/2012 showed an ejection fraction of 60%.    ASSESSMENT: 47 y.o. BRCA-2 positive Pura Spice woman   (1)  status post right breast biopsy 03/20/2012 for a clinical T1b N0, stage IA invasive ductal carcinoma, grade 1-2, triple negative, with an MIB-1 of 100%  (2) biopsy of a suspicious left breast mass was benign  (3) status post bilateral mastectomies with right sentinel lymph node sampling and immediate expander placement 06/14/2012, for a right-sided pT1c pN0, stage IA invasive ductal  carcinoma, grade 1, with ample margins, triple  negative, with an MIB-1 of 100%. (The left breast was benign)  (4)  being treated in the adjuvant setting, the plan being to complete 4 dose dense cycles of doxorubicin/cyclophosphamide followed by 4 dose dense cycles of paclitaxel.  (5) Patient will not need adjuvant radiation. Once she is done with active treatment for her breast cancer she will proceed to bilateral salpingo-oophorectomy at her and Dr. Dawayne Patricia discretion.   PLAN:  Over half of our 45 minute appointment today was spent reviewing Imojean's concerns, counseling her regarding her side effects, reviewing her antinausea regimen, and coordinating care. She scheduled to return next week on June 30 for her second dose of doxorubicin/cyclophosphamide. We again reviewed her antinausea regimen, and I have made a few changes:  1. We will continue her dexamethasone for a couple of extra days, tapering off by day 5.  2. She'll begin using ondansetron twice daily beginning bedtime the evening of treatment, as this did seem to help her significantly with cycle 1. (If she experiences headaches, she'll take a low dose of Benadryl, 12.5 mg, with each dose of ondansetron.)  3. She will take lorazepam at night to help her sleep, but can also take up to twice during the day for what appears to be some anticipatory nausea. I have also added IV lorazepam with her premeds during treatment.  4.  She has metoclopramide and prochlorperazine on hand at home and can utilize either of these medications.  She will begin these on the evening following chemotherapy, and continue for 3 days, then use up to every 6 hours as needed for nausea. 5.  She will return on day 2 for IV fluids when she returns for her Neulasta injection.  We also reviewed neutropenic precautions, and since she has an allergy to penicillin, am starting her on Keflex, 500 mg by mouth twice a day for 7 days prophylactically. She understands to contact us  with any fevers of 100 or above. I have also prescribed Magic mouthwash with lidocaine to help with her mucositis, and she'll continue to utilize either saline or baking soda rinses as well.  Lolly will see Dr. Darnelle Catalan when she returns next week on June 30, but knows to call us prior that time with any changes or problems.    Yuki Purves    08/07/2012

## 2012-08-07 NOTE — Telephone Encounter (Signed)
Per staff message and POF I have scheduled appts.  JMW  

## 2012-08-09 ENCOUNTER — Telehealth: Payer: Self-pay | Admitting: Oncology

## 2012-08-13 ENCOUNTER — Other Ambulatory Visit: Payer: Self-pay | Admitting: Oncology

## 2012-08-13 ENCOUNTER — Ambulatory Visit (HOSPITAL_BASED_OUTPATIENT_CLINIC_OR_DEPARTMENT_OTHER): Payer: BC Managed Care – PPO

## 2012-08-13 ENCOUNTER — Telehealth: Payer: Self-pay | Admitting: *Deleted

## 2012-08-13 ENCOUNTER — Ambulatory Visit (HOSPITAL_BASED_OUTPATIENT_CLINIC_OR_DEPARTMENT_OTHER): Payer: BC Managed Care – PPO | Admitting: Oncology

## 2012-08-13 ENCOUNTER — Other Ambulatory Visit (HOSPITAL_BASED_OUTPATIENT_CLINIC_OR_DEPARTMENT_OTHER): Payer: BC Managed Care – PPO | Admitting: Lab

## 2012-08-13 VITALS — BP 110/69 | HR 69 | Temp 98.2°F | Resp 20 | Ht 66.0 in | Wt 168.7 lb

## 2012-08-13 DIAGNOSIS — C50419 Malignant neoplasm of upper-outer quadrant of unspecified female breast: Secondary | ICD-10-CM

## 2012-08-13 DIAGNOSIS — Z5111 Encounter for antineoplastic chemotherapy: Secondary | ICD-10-CM

## 2012-08-13 DIAGNOSIS — Z1501 Genetic susceptibility to malignant neoplasm of breast: Secondary | ICD-10-CM

## 2012-08-13 DIAGNOSIS — C50919 Malignant neoplasm of unspecified site of unspecified female breast: Secondary | ICD-10-CM

## 2012-08-13 DIAGNOSIS — C50411 Malignant neoplasm of upper-outer quadrant of right female breast: Secondary | ICD-10-CM

## 2012-08-13 DIAGNOSIS — Z171 Estrogen receptor negative status [ER-]: Secondary | ICD-10-CM

## 2012-08-13 LAB — CBC WITH DIFFERENTIAL/PLATELET
Eosinophils Absolute: 0 10*3/uL (ref 0.0–0.5)
HCT: 30.5 % — ABNORMAL LOW (ref 34.8–46.6)
LYMPH%: 24.8 % (ref 14.0–49.7)
MCHC: 34.8 g/dL (ref 31.5–36.0)
MCV: 92.1 fL (ref 79.5–101.0)
MONO#: 0.8 10*3/uL (ref 0.1–0.9)
MONO%: 12.9 % (ref 0.0–14.0)
NEUT#: 3.8 10*3/uL (ref 1.5–6.5)
NEUT%: 61.9 % (ref 38.4–76.8)
Platelets: 183 10*3/uL (ref 145–400)
RBC: 3.31 10*6/uL — ABNORMAL LOW (ref 3.70–5.45)
WBC: 6.2 10*3/uL (ref 3.9–10.3)
nRBC: 1 % — ABNORMAL HIGH (ref 0–0)

## 2012-08-13 MED ORDER — PALONOSETRON HCL INJECTION 0.25 MG/5ML
0.2500 mg | Freq: Once | INTRAVENOUS | Status: AC
  Administered 2012-08-13: 0.25 mg via INTRAVENOUS

## 2012-08-13 MED ORDER — SODIUM CHLORIDE 0.9 % IV SOLN
600.0000 mg/m2 | Freq: Once | INTRAVENOUS | Status: AC
  Administered 2012-08-13: 1140 mg via INTRAVENOUS
  Filled 2012-08-13: qty 57

## 2012-08-13 MED ORDER — DOXORUBICIN HCL CHEMO IV INJECTION 2 MG/ML
60.0000 mg/m2 | Freq: Once | INTRAVENOUS | Status: AC
  Administered 2012-08-13: 114 mg via INTRAVENOUS
  Filled 2012-08-13: qty 57

## 2012-08-13 MED ORDER — SODIUM CHLORIDE 0.9 % IV SOLN
150.0000 mg | Freq: Once | INTRAVENOUS | Status: AC
  Administered 2012-08-13: 150 mg via INTRAVENOUS
  Filled 2012-08-13: qty 5

## 2012-08-13 MED ORDER — HEPARIN SOD (PORK) LOCK FLUSH 100 UNIT/ML IV SOLN
500.0000 [IU] | Freq: Once | INTRAVENOUS | Status: AC | PRN
  Administered 2012-08-13: 500 [IU]
  Filled 2012-08-13: qty 5

## 2012-08-13 MED ORDER — SODIUM CHLORIDE 0.9 % IJ SOLN
10.0000 mL | INTRAMUSCULAR | Status: DC | PRN
  Administered 2012-08-13: 10 mL
  Filled 2012-08-13: qty 10

## 2012-08-13 MED ORDER — LORAZEPAM 2 MG/ML IJ SOLN
0.5000 mg | Freq: Once | INTRAMUSCULAR | Status: AC
  Administered 2012-08-13: 0.5 mg via INTRAVENOUS

## 2012-08-13 MED ORDER — DEXAMETHASONE SODIUM PHOSPHATE 20 MG/5ML IJ SOLN
12.0000 mg | Freq: Once | INTRAMUSCULAR | Status: AC
  Administered 2012-08-13: 12 mg via INTRAVENOUS

## 2012-08-13 MED ORDER — SODIUM CHLORIDE 0.9 % IV SOLN
INTRAVENOUS | Status: DC
  Administered 2012-08-13: 11:00:00 via INTRAVENOUS

## 2012-08-13 NOTE — Telephone Encounter (Signed)
Pt stop by to cancel her appts for 08/21/12 and to schedule for the next day 08/22/12. Per [facility] gv appt d/t for 79/14 w/JH.pt is aware...td

## 2012-08-13 NOTE — Patient Instructions (Addendum)
[facility] Discharge Instructions for Patients Receiving Chemotherapy  Today you received the following chemotherapy agents Adriamycin/Cytoxan To help prevent nausea and vomiting after your treatment, we encourage you to take your nausea medication as prescribed.If you develop nausea and vomiting that is not controlled by your nausea medication, call the clinic.   BELOW ARE SYMPTOMS THAT SHOULD BE REPORTED IMMEDIATELY:  *FEVER GREATER THAN 100.5 F  *CHILLS WITH OR WITHOUT FEVER  NAUSEA AND VOMITING THAT IS NOT CONTROLLED WITH YOUR NAUSEA MEDICATION  *UNUSUAL SHORTNESS OF BREATH  *UNUSUAL BRUISING OR BLEEDING  TENDERNESS IN MOUTH AND THROAT WITH OR WITHOUT PRESENCE OF ULCERS  *URINARY PROBLEMS  *BOWEL PROBLEMS  UNUSUAL RASH Items with * indicate a potential emergency and should be followed up as soon as possible.  Feel free to call the clinic you have any questions or concerns. The clinic phone number is [phone].   Doxorubicin injection (Adriamycin) What is this medicine? DOXORUBICIN (dox oh ROO bi sin) is a chemotherapy drug. It is used to treat many kinds of cancer like Hodgkin's disease, leukemia, non-Hodgkin's lymphoma, neuroblastoma, sarcoma, and Wilms' tumor. It is also used to treat bladder cancer, breast cancer, lung cancer, ovarian cancer, stomach cancer, and thyroid cancer. This medicine may be used for other purposes; ask your health care provider or pharmacist if you have questions. What should I tell my health care provider before I take this medicine? They need to know if you have any of these conditions: -blood disorders -heart disease, recent heart attack -infection (especially a virus infection such as chickenpox, cold sores, or herpes) -irregular heartbeat -liver disease -recent or ongoing radiation therapy -an unusual or allergic reaction to doxorubicin, other chemotherapy agents, other medicines, foods, dyes, or  preservatives -pregnant or trying to get pregnant -breast-feeding How should I use this medicine? This drug is given as an infusion into a vein. It is administered in a hospital or clinic by a specially trained health care professional. If you have pain, swelling, burning or any unusual feeling around the site of your injection, tell your health care professional right away. Talk to your pediatrician regarding the use of this medicine in children. Special care may be needed. Overdosage: If you think you have taken too much of this medicine contact a poison control center or emergency room at once. NOTE: This medicine is only for you. Do not share this medicine with others. What if I miss a dose? It is important not to miss your dose. Call your doctor or health care professional if you are unable to keep an appointment. What may interact with this medicine? Do not take this medicine with any of the following medications: -cisapride -droperidol -halofantrine -pimozide -zidovudine This medicine may also interact with the following medications: -chloroquine -chlorpromazine -clarithromycin -cyclophosphamide -cyclosporine -erythromycin -medicines for depression, anxiety, or psychotic disturbances -medicines for irregular heart beat like amiodarone, bepridil, dofetilide, encainide, flecainide, propafenone, quinidine -medicines for seizures like ethotoin, fosphenytoin, phenytoin -medicines for nausea, vomiting like dolasetron, ondansetron, palonosetron -medicines to increase blood counts like filgrastim, pegfilgrastim, sargramostim -methadone -methotrexate -pentamidine -progesterone -vaccines -verapamil Talk to your doctor or health care professional before taking any of these medicines: -acetaminophen -aspirin -ibuprofen -ketoprofen -naproxen This list may not describe all possible interactions. Give your health care provider a list of all the medicines, herbs, non-prescription  drugs, or dietary supplements you use. Also tell them if you smoke, drink alcohol, or use illegal drugs. Some items may interact with your medicine. What should I  watch for while using this medicine? Your condition will be monitored carefully while you are receiving this medicine. You will need important blood work done while you are taking this medicine. This drug may make you feel generally unwell. This is not uncommon, as chemotherapy can affect healthy cells as well as cancer cells. Report any side effects. Continue your course of treatment even though you feel ill unless your doctor tells you to stop. Your urine may turn red for a few days after your dose. This is not blood. If your urine is dark or brown, call your doctor. In some cases, you may be given additional medicines to help with side effects. Follow all directions for their use. Call your doctor or health care professional for advice if you get a fever, chills or sore throat, or other symptoms of a cold or flu. Do not treat yourself. This drug decreases your body's ability to fight infections. Try to avoid being around people who are sick. This medicine may increase your risk to bruise or bleed. Call your doctor or health care professional if you notice any unusual bleeding. Be careful brushing and flossing your teeth or using a toothpick because you may get an infection or bleed more easily. If you have any dental work done, tell your dentist you are receiving this medicine. Avoid taking products that contain aspirin, acetaminophen, ibuprofen, naproxen, or ketoprofen unless instructed by your doctor. These medicines may hide a fever. Men and women of childbearing age should use effective birth control methods while using taking this medicine. Do not become pregnant while taking this medicine. There is a potential for serious side effects to an unborn child. Talk to your health care professional or pharmacist for more information. Do not  breast-feed an infant while taking this medicine. Do not let others touch your urine or other body fluids for 5 days after each treatment with this medicine. Caregivers should wear latex gloves to avoid touching body fluids during this time. What side effects may I notice from receiving this medicine? Side effects that you should report to your doctor or health care professional as soon as possible: -allergic reactions like skin rash, itching or hives, swelling of the face, lips, or tongue -low blood counts - this medicine may decrease the number of white blood cells, red blood cells and platelets. You may be at increased risk for infections and bleeding. -signs of infection - fever or chills, cough, sore throat, pain or difficulty passing urine -signs of decreased platelets or bleeding - bruising, pinpoint red spots on the skin, black, tarry stools, blood in the urine -signs of decreased red blood cells - unusually weak or tired, fainting spells, lightheadedness -breathing problems -chest pain -fast, irregular heartbeat -mouth sores -nausea, vomiting -pain, swelling, redness at site where injected -pain, tingling, numbness in the hands or feet -swelling of ankles, feet, or hands -unusual bleeding or bruising Side effects that usually do not require medical attention (report to your doctor or health care professional if they continue or are bothersome): -diarrhea -facial flushing -hair loss -loss of appetite -missed menstrual periods -nail discoloration or damage -red or watery eyes -red colored urine -stomach upset This list may not describe all possible side effects. Call your doctor for medical advice about side effects. You may report side effects to FDA at 1-800-FDA-1088. Where should I keep my medicine? This drug is given in a hospital or clinic and will not be stored at home. NOTE: This sheet is a summary.  It may not cover all possible information. If you have questions about  this medicine, talk to your doctor, pharmacist, or health care provider.  2013, Elsevier/Gold Standard. (05/22/2007 5:07:32 PM)   Cyclophosphamide injection (Cytoxan) What is this medicine? CYCLOPHOSPHAMIDE (sye kloe FOSS fa mide) is a chemotherapy drug. It slows the growth of cancer cells. This medicine is used to treat many types of cancer like lymphoma, myeloma, leukemia, breast cancer, and ovarian cancer, to name a few. It is also used to treat nephrotic syndrome in children. This medicine may be used for other purposes; ask your health care provider or pharmacist if you have questions. What should I tell my health care provider before I take this medicine? They need to know if you have any of these conditions: -blood disorders -history of other chemotherapy -history of radiation therapy -infection -kidney disease -liver disease -tumors in the bone marrow -an unusual or allergic reaction to cyclophosphamide, other chemotherapy, other medicines, foods, dyes, or preservatives -pregnant or trying to get pregnant -breast-feeding How should I use this medicine? This drug is usually given as an injection into a vein or muscle or by infusion into a vein. It is administered in a hospital or clinic by a specially trained health care professional. Talk to your pediatrician regarding the use of this medicine in children. While this drug may be prescribed for selected conditions, precautions do apply. Overdosage: If you think you have taken too much of this medicine contact a poison control center or emergency room at once. NOTE: This medicine is only for you. Do not share this medicine with others. What if I miss a dose? It is important not to miss your dose. Call your doctor or health care professional if you are unable to keep an appointment. What may interact with this medicine? Do not take this medicine with any of the following medications: -mibefradil -nalidixic acid This medicine may  also interact with the following medications: -doxorubicin -etanercept -medicines to increase blood counts like filgrastim, pegfilgrastim, sargramostim -medicines that block muscle or nerve pain -St. John's Wort -phenobarbital -succinylcholine chloride -trastuzumab -vaccines Talk to your doctor or health care professional before taking any of these medicines: -acetaminophen -aspirin -ibuprofen -ketoprofen -naproxen This list may not describe all possible interactions. Give your health care provider a list of all the medicines, herbs, non-prescription drugs, or dietary supplements you use. Also tell them if you smoke, drink alcohol, or use illegal drugs. Some items may interact with your medicine. What should I watch for while using this medicine? Visit your doctor for checks on your progress. This drug may make you feel generally unwell. This is not uncommon, as chemotherapy can affect healthy cells as well as cancer cells. Report any side effects. Continue your course of treatment even though you feel ill unless your doctor tells you to stop. Drink water or other fluids as directed. Urinate often, even at night. In some cases, you may be given additional medicines to help with side effects. Follow all directions for their use. Call your doctor or health care professional for advice if you get a fever, chills or sore throat, or other symptoms of a cold or flu. Do not treat yourself. This drug decreases your body's ability to fight infections. Try to avoid being around people who are sick. This medicine may increase your risk to bruise or bleed. Call your doctor or health care professional if you notice any unusual bleeding. Be careful brushing and flossing your teeth or using a toothpick  because you may get an infection or bleed more easily. If you have any dental work done, tell your dentist you are receiving this medicine. Avoid taking products that contain aspirin, acetaminophen,  ibuprofen, naproxen, or ketoprofen unless instructed by your doctor. These medicines may hide a fever. Do not become pregnant while taking this medicine. Women should inform their doctor if they wish to become pregnant or think they might be pregnant. There is a potential for serious side effects to an unborn child. Talk to your health care professional or pharmacist for more information. Do not breast-feed an infant while taking this medicine. Men should inform their doctor if they wish to father a child. This medicine may lower sperm counts. If you are going to have surgery, tell your doctor or health care professional that you have taken this medicine. What side effects may I notice from receiving this medicine? Side effects that you should report to your doctor or health care professional as soon as possible: -allergic reactions like skin rash, itching or hives, swelling of the face, lips, or tongue -low blood counts - this medicine may decrease the number of white blood cells, red blood cells and platelets. You may be at increased risk for infections and bleeding. -signs of infection - fever or chills, cough, sore throat, pain or difficulty passing urine -signs of decreased platelets or bleeding - bruising, pinpoint red spots on the skin, black, tarry stools, blood in the urine -signs of decreased red blood cells - unusually weak or tired, fainting spells, lightheadedness -breathing problems -dark urine -mouth sores -pain, swelling, redness at site where injected -swelling of the ankles, feet, hands -trouble passing urine or change in the amount of urine -weight gain -yellowing of the eyes or skin Side effects that usually do not require medical attention (report to your doctor or health care professional if they continue or are bothersome): -changes in nail or skin color -diarrhea -hair loss -loss of appetite -missed menstrual periods -nausea, vomiting -stomach pain This list may not  describe all possible side effects. Call your doctor for medical advice about side effects. You may report side effects to FDA at 1-800-FDA-1088. Where should I keep my medicine? This drug is given in a hospital or clinic and will not be stored at home. NOTE: This sheet is a summary. It may not cover all possible information. If you have questions about this medicine, talk to your doctor, pharmacist, or health care provider.  2013, Elsevier/Gold Standard. (05/08/2007 2:32:25 PM)

## 2012-08-13 NOTE — Progress Notes (Signed)
ID: Norma [name]   DOB: 07/12/1965  MR#: [id number]  WUJ#:[id number]  PCP: Selena Batten GYN: Gerald Leitz SU: Cicero Duck OTHER MD: Lurline Hare, Rogelia Mire, Blende, Etter Sjogren   HISTORY OF PRESENT ILLNESS: Norma [name] had routine screening mammography at [facility] 11/16/2010 suggesting additional imaging on the right. This was performed the next day, and found no significant residual abnormality. Repeat screening mammography 03/15/2012 again suggested an area of architectural distortion in the right breast. In the same quadrant as before. Additional views 03/19/2012 found a 3 mm hypoechoic mass in the area in question. Biopsy of this mass 03/20/2012 showed an invasive ductal carcinoma, grade 1-2, triple negative, with an MIB-1 of 100%.  Breast MRI 03/23/2012 showed an area of poorly defined enhancement in the right breast associated with clip artifact. This measured 1 cm. In the left breast there was an enhancing mass measuring 2.0 cm. There were no other suspicious findings. The patient's subsequent history is as detailed below.  INTERVAL HISTORY: Norma [name] returns today accompanied by her sister, Norma, for followup of her right breast cancer.  She is currently day 1 cycle 2 of 4 planned dose dense cycles of doxorubicin/cyclophosphamide being given in the adjuvant setting.   REVIEW OF SYSTEMS: Hebe tells me cycle 1 was "bad". She had significant amounts of nausea and vomiting the first 24-36 hours. Things got better after that. She has met with Zollie Scale, my physician's assistant, and we have made some changes in her nausea medicines which hopefully will help. She also does get headaches from the ondansetron. We discussed how to deal with that. On the plus side, she has not yet lost her hair. After day 3 she started feeling "great". She did a lot of yard work. A detailed review of systems is currently noncontributory   PAST MEDICAL HISTORY: Past Medical History  Diagnosis Date  . Breast cancer    . PONV (postoperative nausea and vomiting)   . Depression     PAST SURGICAL HISTORY: Past Surgical History  Procedure Laterality Date  . Anterior cruciate ligament repair  2010  . Endometrial ablation      2012  . Dilation and curettage of uterus    . Basal cell carcinoma excision      OFF NOSE  2011   . Simple mastectomy w/ sentinel node biopsy Bilateral 06/15/2012    Dr Jamey Ripa  . Simple mastectomy with axillary sentinel node biopsy Right 06/14/2012    Procedure: SIMPLE MASTECTOMY WITH AXILLARY SENTINEL NODE BIOPSY;  Surgeon: Currie Paris, MD;  Location: [facility] OR;  Service: General;  Laterality: Right;  . Total mastectomy Left 06/14/2012    Procedure: TOTAL MASTECTOMY;  Surgeon: Currie Paris, MD;  Location: [facility] OR;  Service: General;  Laterality: Left;  . Breast reconstruction with placement of tissue expander and flex hd (acellular hydrated dermis) Bilateral 06/14/2012    Procedure: BILATERAL BREAST RECONSTRUCTION WITH PLACEMENT OF BILATERAL TISSUE EXPANDERS;  Surgeon: Etter Sjogren, MD;  Location: [facility];  Service: Plastics;  Laterality: Bilateral;  Moh's surgery for basal cell, Left nares  FAMILY HISTORY Family History  Problem Relation Age of Onset  . Breast cancer Maternal Aunt     dx in her 26s  . Ovarian cancer Maternal Aunt     dx in her 41s  . Esophageal cancer Maternal Uncle   . Liver cancer Maternal Uncle   . Lung cancer Maternal Uncle   . BRCA 1/2 Maternal Uncle     BRCA 2 +  .  Breast cancer Cousin     dx in her 30s; BRCA2+  . Pancreatic cancer Maternal Uncle     dx in his 58s  . BRCA 1/2 Maternal Uncle     BRCA2+  . Colon cancer Maternal Uncle    the patient's parents are alive, in their early 7s. The patient has one brother and one sister. There is a significant family history of cancer and this includes one of the patient's mother is 2 sisters with ovarian and breast cancer. A first cousin of the patient had breast cancer in her 30s. She has been tested for  the BRCA gene, but the patient does not know the results. There were also brothers of the patient's mother with esophageal pancreas and colon cancer. The patient has been scheduled for genetic testing.  GYNECOLOGIC HISTORY: Menarche age 20, first live birth age 47, she is GX P2. The patient's periods are increase in bili scans and irregular, but still ongoing. She did take birth control pills for approximately 10 years with no untoward events.  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 Her husband Norma [name] is a Academic librarian. He owns his own business. He is originally from [place]. Their children are Norma [name] 17 and Norma [name] 14.     ADVANCED DIRECTIVES: Not in place. If both she and her husband become disabled and cannot make her own health care decisions, the patient intends to name her friend Norma [name] as healthcare power of attorney. Otherwise of course she and [name] will be each other self care power of attorney  HEALTH MAINTENANCE: History  Substance Use Topics  . Smoking status: Never Smoker   . Smokeless tobacco: Never Used  . Alcohol Use: Yes     Comment: daily     Colonoscopy:  PAP:  Bone density:  Lipid panel:  Allergies  Allergen Reactions  . Adhesive (Tape)     Pt doesn't like it  . Penicillins Nausea Only and Rash    Current Outpatient Prescriptions  Medication Sig Dispense Refill  . Alum & Mag Hydroxide-Simeth (MAGIC MOUTHWASH W/LIDOCAINE) SOLN Take 5 mLs by mouth 4 (four) times daily as needed (mouth pain).  240 mL  2  . buPROPion (WELLBUTRIN XL) 300 MG 24 hr tablet Take 300 mg by mouth every morning.       . cephALEXin (KEFLEX) 500 MG capsule Take 1 capsule (500 mg total) by mouth 2 (two) times daily.  14 capsule  0  . dexamethasone (DECADRON) 4 MG tablet Take 2 tablets by mouth twice daily with food for 3 days after each chemo.  30 tablet  1  . escitalopram (LEXAPRO) 20 MG tablet Take 10 mg by mouth every  morning. Takes 1/2 tablet      . lidocaine-prilocaine (EMLA) cream Apply to port 1-2 hr before each procedure  30 g  2  . LORazepam (ATIVAN) 0.5 MG tablet Take 1 tablet (0.5 mg total) by mouth at bedtime as needed (anxiety, insomnia, or nausea).  30 tablet  0  . methocarbamol (ROBAXIN) 500 MG tablet Take 1 tablet (500 mg total) by mouth 4 (four) times daily.  40 tablet  1  . metoCLOPramide (REGLAN) 10 MG tablet Take 1 tablet (10 mg total) by mouth 4 (four) times daily. As needed for nausea  30 tablet  2  . ondansetron (ZOFRAN-ODT) 8 MG disintegrating tablet Take 8 mg by mouth every 8 (eight) hours as needed for nausea.      Marland Kitchen  prochlorperazine (COMPAZINE) 10 MG tablet Take 1 tablet (10 mg total) by mouth every 6 (six) hours as needed (Nausea or vomiting).  30 tablet  1   No current facility-administered medications for this visit.    OBJECTIVE: Middle-aged white woman  in no acute distress Filed Vitals:   08/13/12 1006  BP: 110/69  Pulse: 69  Temp: 98.2 F (36.8 C)  Resp: 20     Body mass index is 27.24 kg/(m^2).    ECOG FS: 0 Filed Weights   08/13/12 1006  Weight: 168 lb 11.2 oz (76.522 kg)    Sclerae unicteric Oropharynx clear No cervical or supraclavicular adenopathy Lungs clear to auscultation bilaterally, no rales or rhonchi Heart regular rate and rhythm Abdomen soft, nontender to palpation, with positive bowel sounds MSK no focal spinal tenderness, no peripheral edema Neuro: nonfocal, well oriented, pleasant affect Breasts: Patient status post bilateral mastectomies. She has expanders in place. There is no dehiscence, erythema, or unusual tenderness  Port is intact in the right upper chest wall   LAB RESULTS: Lab Results  Component Value Date   WBC 6.2 08/13/2012   NEUTROABS 3.8 08/13/2012   HGB 10.6* 08/13/2012   HCT 30.5* 08/13/2012   MCV 92.1 08/13/2012   PLT 183 08/13/2012      Chemistry      Component Value Date/Time   NA 138 08/07/2012 0808   NA 135 06/16/2012  0435   K 3.6 08/07/2012 0808   K 3.2* 06/16/2012 0435   CL 105 08/07/2012 0808   CL 101 06/16/2012 0435   CO2 24 08/07/2012 0808   CO2 27 06/16/2012 0435   BUN 10.2 08/07/2012 0808   BUN 4* 06/16/2012 0435   CREATININE 0.7 08/07/2012 0808   CREATININE 0.66 06/16/2012 0435      Component Value Date/Time   CALCIUM 9.4 08/07/2012 0808   CALCIUM 8.4 06/16/2012 0435   ALKPHOS 53 08/07/2012 0808   ALKPHOS 54 06/07/2012 1053   AST 12 08/07/2012 0808   AST 27 06/07/2012 1053   ALT 11 08/07/2012 0808   ALT 16 06/07/2012 1053   BILITOT 0.68 08/07/2012 0808   BILITOT 0.3 06/07/2012 1053       Lab Results  Component Value Date   LABCA2 23 03/28/2012     STUDIES:  Echocardiogram on 07/23/2012 showed an ejection fraction of 60%.    ASSESSMENT: 47 y.o. BRCA-2 positive Pura Spice woman   (1)  status post right breast biopsy 03/20/2012 for a clinical T1b N0, stage IA invasive ductal carcinoma, grade 1-2, triple negative, with an MIB-1 of 100%  (2) biopsy of a suspicious left breast mass was benign  (3) status post bilateral mastectomies with right sentinel lymph node sampling and immediate expander placement 06/14/2012, for a right-sided pT1c pN0, stage IA invasive ductal carcinoma, grade 1, with ample margins, triple negative, with an MIB-1 of 100%. (The left breast was benign)  (4)  being treated in the adjuvant setting, the plan being to complete 4 dose dense cycles of doxorubicin/ cyclophosphamide followed by 4 dose dense cycles of paclitaxel.  (5) Patient will not need adjuvant radiation. Once she is done with active treatment for her breast cancer she will proceed to bilateral salpingo-oophorectomy at her and Dr. Dawayne Patricia discretion.   PLAN:  We discussed the different mechanisms of nausea, she understands that associative nausea is the most difficult one to control. I have encouraged her to use more lorazepam and she will also receive lorazepam on her pre-metastases. Hopefully this  second cycle will be  better than the first. If not we will have to reassess the overall plan    MAGRINAT,GUSTAV C    08/13/2012

## 2012-08-13 NOTE — Progress Notes (Signed)
Excellent blood return before, during and after Adriamycin infusion.

## 2012-08-14 ENCOUNTER — Ambulatory Visit: Payer: BC Managed Care – PPO

## 2012-08-14 ENCOUNTER — Ambulatory Visit (HOSPITAL_BASED_OUTPATIENT_CLINIC_OR_DEPARTMENT_OTHER): Payer: BC Managed Care – PPO

## 2012-08-14 VITALS — BP 126/90 | HR 64 | Temp 97.5°F

## 2012-08-14 DIAGNOSIS — C50411 Malignant neoplasm of upper-outer quadrant of right female breast: Secondary | ICD-10-CM

## 2012-08-14 DIAGNOSIS — Z1501 Genetic susceptibility to malignant neoplasm of breast: Secondary | ICD-10-CM

## 2012-08-14 DIAGNOSIS — Z5189 Encounter for other specified aftercare: Secondary | ICD-10-CM

## 2012-08-14 DIAGNOSIS — C50419 Malignant neoplasm of upper-outer quadrant of unspecified female breast: Secondary | ICD-10-CM

## 2012-08-14 MED ORDER — HEPARIN SOD (PORK) LOCK FLUSH 100 UNIT/ML IV SOLN
500.0000 [IU] | Freq: Once | INTRAVENOUS | Status: AC
  Administered 2012-08-14: 500 [IU] via INTRAVENOUS
  Filled 2012-08-14: qty 5

## 2012-08-14 MED ORDER — SODIUM CHLORIDE 0.9 % IV SOLN
Freq: Once | INTRAVENOUS | Status: AC
  Administered 2012-08-14: 09:00:00 via INTRAVENOUS

## 2012-08-14 MED ORDER — SODIUM CHLORIDE 0.9 % IJ SOLN
10.0000 mL | INTRAMUSCULAR | Status: DC | PRN
  Administered 2012-08-14: 10 mL via INTRAVENOUS
  Filled 2012-08-14: qty 10

## 2012-08-14 MED ORDER — PEGFILGRASTIM INJECTION 6 MG/0.6ML
6.0000 mg | Freq: Once | SUBCUTANEOUS | Status: AC
  Administered 2012-08-14: 6 mg via SUBCUTANEOUS
  Filled 2012-08-14: qty 0.6

## 2012-08-14 NOTE — Patient Instructions (Addendum)
Pegfilgrastim injection What is this medicine? PEGFILGRASTIM (peg fil GRA stim) helps the body make more white blood cells. It is used to prevent infection in people with low amounts of white blood cells following cancer treatment. This medicine may be used for other purposes; ask your health care provider or pharmacist if you have questions. What should I tell my health care provider before I take this medicine? They need to know if you have any of these conditions: -sickle cell disease -an unusual or allergic reaction to pegfilgrastim, filgrastim, E.coli protein, other medicines, foods, dyes, or preservatives -pregnant or trying to get pregnant -breast-feeding How should I use this medicine? This medicine is for injection under the skin. It is usually given by a health care professional in a hospital or clinic setting. If you get this medicine at home, you will be taught how to prepare and give this medicine. Do not shake this medicine. Use exactly as directed. Take your medicine at regular intervals. Do not take your medicine more often than directed. It is important that you put your used needles and syringes in a special sharps container. Do not put them in a trash can. If you do not have a sharps container, call your pharmacist or healthcare provider to get one. Talk to your pediatrician regarding the use of this medicine in children. While this drug may be prescribed for children who weigh more than 45 kg for selected conditions, precautions do apply Overdosage: If you think you have taken too much of this medicine contact a poison control center or emergency room at once. NOTE: This medicine is only for you. Do not share this medicine with others. What if I miss a dose? If you miss a dose, take it as soon as you can. If it is almost time for your next dose, take only that dose. Do not take double or extra doses. What may interact with this medicine? -lithium -medicines for growth  therapy This list may not describe all possible interactions. Give your health care provider a list of all the medicines, herbs, non-prescription drugs, or dietary supplements you use. Also tell them if you smoke, drink alcohol, or use illegal drugs. Some items may interact with your medicine. What should I watch for while using this medicine? Visit your doctor for regular check ups. You will need important blood work done while you are taking this medicine. What side effects may I notice from receiving this medicine? Side effects that you should report to your doctor or health care professional as soon as possible: -allergic reactions like skin rash, itching or hives, swelling of the face, lips, or tongue -breathing problems -fever -pain, redness, or swelling where injected -shoulder pain -stomach or side pain Side effects that usually do not require medical attention (report to your doctor or health care professional if they continue or are bothersome): -aches, pains -headache -loss of appetite -nausea, vomiting -unusually tired This list may not describe all possible side effects. Call your doctor for medical advice about side effects. You may report side effects to FDA at 1-800-FDA-1088. Where should I keep my medicine? Keep out of the reach of children. Store in a refrigerator between 2 and 8 degrees C (36 and 46 degrees F). Do not freeze. Keep in carton to protect from light. Throw away this medicine if it is left out of the refrigerator for more than 48 hours. Throw away any unused medicine after the expiration date. NOTE: This sheet is a summary. It may   not cover all possible information. If you have questions about this medicine, talk to your doctor, pharmacist, or health care provider.  2013, Elsevier/Gold Standard. (09/03/2007 3:41:44 PM)   Continue to hydrate orally to prevent dehydration!!  Dehydration, Adult Dehydration means your body does not have as much fluid as it  needs. Your kidneys, brain, and heart will not work properly without the right amount of fluids and salt.  HOME CARE  Ask your doctor how to replace body fluid losses (rehydrate).  Drink enough fluids to keep your pee (urine) clear or pale yellow.  Drink small amounts of fluids often if you feel sick to your stomach (nauseous) or throw up (vomit).  Eat like you normally do.  Avoid:  Foods or drinks high in sugar.  Bubbly (carbonated) drinks.  Juice.  Very hot or cold fluids.  Drinks with caffeine.  Fatty, greasy foods.  Alcohol.  Tobacco.  Eating too much.  Gelatin desserts.  Wash your hands to avoid spreading germs (bacteria, viruses).  Only take medicine as told by your doctor.  Keep all doctor visits as told. GET HELP RIGHT AWAY IF:   You cannot drink something without throwing up.  You get worse even with treatment.  Your vomit has blood in it or looks greenish.  Your poop (stool) has blood in it or looks black and tarry.  You have not peed in 6 to 8 hours.  You pee a small amount of very dark pee.  You have a fever.  You pass out (faint).  You have belly (abdominal) pain that gets worse or stays in one spot (localizes).  You have a rash, stiff neck, or bad headache.  You get easily annoyed, sleepy, or are hard to wake up.  You feel weak, dizzy, or very thirsty. MAKE SURE YOU:   Understand these instructions.  Will watch your condition.  Will get help right away if you are not doing well or get worse. Document Released: 11/27/2008 Document Revised: 04/25/2011 Document Reviewed: 09/20/2010 [facility] Patient Information 2014 [place], [place].

## 2012-08-21 ENCOUNTER — Other Ambulatory Visit: Payer: BC Managed Care – PPO | Admitting: Lab

## 2012-08-21 ENCOUNTER — Ambulatory Visit: Payer: BC Managed Care – PPO | Admitting: Oncology

## 2012-08-22 ENCOUNTER — Other Ambulatory Visit (HOSPITAL_BASED_OUTPATIENT_CLINIC_OR_DEPARTMENT_OTHER): Payer: BC Managed Care – PPO | Admitting: Lab

## 2012-08-22 ENCOUNTER — Encounter: Payer: Self-pay | Admitting: Family

## 2012-08-22 ENCOUNTER — Ambulatory Visit (HOSPITAL_BASED_OUTPATIENT_CLINIC_OR_DEPARTMENT_OTHER): Payer: BC Managed Care – PPO | Admitting: Family

## 2012-08-22 VITALS — BP 116/75 | HR 80 | Temp 98.5°F | Resp 20 | Ht 66.0 in | Wt 165.1 lb

## 2012-08-22 DIAGNOSIS — C50411 Malignant neoplasm of upper-outer quadrant of right female breast: Secondary | ICD-10-CM

## 2012-08-22 DIAGNOSIS — C50919 Malignant neoplasm of unspecified site of unspecified female breast: Secondary | ICD-10-CM

## 2012-08-22 DIAGNOSIS — C50419 Malignant neoplasm of upper-outer quadrant of unspecified female breast: Secondary | ICD-10-CM

## 2012-08-22 DIAGNOSIS — Z1509 Genetic susceptibility to other malignant neoplasm: Secondary | ICD-10-CM

## 2012-08-22 LAB — CBC WITH DIFFERENTIAL/PLATELET
BASO%: 0.5 % (ref 0.0–2.0)
EOS%: 0 % (ref 0.0–7.0)
HCT: 33.1 % — ABNORMAL LOW (ref 34.8–46.6)
LYMPH%: 17.3 % (ref 14.0–49.7)
MCH: 32.9 pg (ref 25.1–34.0)
MCHC: 35.3 g/dL (ref 31.5–36.0)
MCV: 93.3 fL (ref 79.5–101.0)
MONO%: 10.8 % (ref 0.0–14.0)
NEUT%: 71.4 % (ref 38.4–76.8)
Platelets: 246 10*3/uL (ref 145–400)
lymph#: 1.4 10*3/uL (ref 0.9–3.3)

## 2012-08-22 LAB — COMPREHENSIVE METABOLIC PANEL (CC13)
Albumin: 4.3 g/dL (ref 3.5–5.0)
Alkaline Phosphatase: 80 U/L (ref 40–150)
BUN: 7.3 mg/dL (ref 7.0–26.0)
Glucose: 100 mg/dl (ref 70–140)
Total Bilirubin: 0.33 mg/dL (ref 0.20–1.20)

## 2012-08-22 MED ORDER — LORAZEPAM 0.5 MG PO TABS: 0.5000 mg | ORAL_TABLET | Freq: Every evening | ORAL | Status: DC | PRN

## 2012-08-22 NOTE — Patient Instructions (Addendum)
Please contact us at [phone] if you have any questions or concerns.  Please continue to do well and enjoy life!!!  Get plenty of rest, drink plenty of water, exercise daily, eat a balanced diet.  Remember Aleve (as directed) on day of chemo x 5 days (if needed) for headaches.  Ask for cup of ice during chemo for mouth sores.

## 2012-08-22 NOTE — Progress Notes (Signed)
[facility]  [phone] [phone]  OFFICE PROGRESS NOTE    ID: Norma [name]   DOB: [birth year]  MR#: [id number]  JYN#:[id number]   PCP: Selena Batten, MD GYN: Gerald Leitz, MD SU: Cyndia Bent, MD OTHER MDs: Lurline Hare, Rogelia Mire, [facility] Derrill Kay   HISTORY OF PRESENT ILLNESS: Norma [name] had routine screening mammography at [facility] 11/16/2010 suggesting additional imaging on the right. This was performed the next day, and found no significant residual abnormality. Repeat screening mammography 03/15/2012 again suggested an area of architectural distortion in the right breast. In the same quadrant as before. Additional views 03/19/2012 found a 3 mm hypoechoic mass in the area in question. Biopsy of this mass 03/20/2012 showed an invasive ductal carcinoma, grade 1-2, triple negative, with an MIB-1 of 100%.  Breast MRI 03/23/2012 showed an area of poorly defined enhancement in the right breast associated with clip artifact. This measured 1 cm. In the left breast there was an enhancing mass measuring 2.0 cm. There were no other suspicious findings. The patient's subsequent history is as detailed below.  INTERVAL HISTORY: Norma [name] returns today for followup of invasive ductal carcinoma of the right breast.  Today's nadir visit is to assess her chemotoxicity from cycle 2 of 4 planned dose dense cycles of doxorubicin/cyclophosphamide being given in the adjuvant setting.  She is tolerating chemotherapy fairly well.  REVIEW OF SYSTEMS: Norma [name] tells me cycle 2 was physically better than cycle 1 for her.  She felt that her nausea was better controlled after her second cycle of chemotherapy, but states that her recovery (feeling normal) seemed to take a bit longer this time.  We discussed her nausea medication regimen and written schedule provided by Zollie Scale, PA-C.  She states that she has had headaches after chemotherapy that the headaches  sometimes wake her up in the middle of the night.  Taking Aleve with a meal  twice a day starting the day of chemotherapy (Mondays) and continuing until the end of the week (Fridays) may help with her headaches.  Hydration during chemotherapy was also emphasized.  The patient also mentions an altered sense of taste. A detailed review of systems is currently noncontributory the patient denies any other symptomatology.   PAST MEDICAL HISTORY: Past Medical History  Diagnosis Date  . Breast cancer   . PONV (postoperative nausea and vomiting)   . Depression     PAST SURGICAL HISTORY: Past Surgical History  Procedure Laterality Date  . Anterior cruciate ligament repair  2010  . Endometrial ablation      2012  . Dilation and curettage of uterus    . Basal cell carcinoma excision      OFF NOSE  2011   . Simple mastectomy w/ sentinel node biopsy Bilateral 06/15/2012    Dr Jamey Ripa  . Simple mastectomy with axillary sentinel node biopsy Right 06/14/2012    Procedure: SIMPLE MASTECTOMY WITH AXILLARY SENTINEL NODE BIOPSY;  Surgeon: Currie Paris, MD;  Location: [facility] OR;  Service: General;  Laterality: Right;  . Total mastectomy Left 06/14/2012    Procedure: TOTAL MASTECTOMY;  Surgeon: Currie Paris, MD;  Location[facility] OR;  Service: General;  Laterality: Left;  . Breast reconstruction with placement of tissue expander and flex hd (acellular hydrated dermis) Bilateral 06/14/2012    Procedure: BILATERAL BREAST RECONSTRUCTION WITH PLACEMENT OF BILATERAL TISSUE EXPANDERS;  Surgeon: Etter Sjogren, MD;  Location: [facility] OR;  Service: Plastics;  Laterality: Bilateral;  Moh's surgery for basal  cell, Left nares  FAMILY HISTORY Family History  Problem Relation Age of Onset  . Breast cancer Maternal Aunt     dx in her 3s  . Ovarian cancer Maternal Aunt     dx in her 16s  . Esophageal cancer Maternal Uncle   . Liver cancer Maternal Uncle   . Lung cancer Maternal Uncle   . BRCA 1/2 Maternal Uncle     BRCA 2  +  . Breast cancer Cousin     dx in her 30s; BRCA2+  . Pancreatic cancer Maternal Uncle     dx in his 104s  . BRCA 1/2 Maternal Uncle     BRCA2+  . Colon cancer Maternal Uncle   The patient's parents are alive, in their early 7s. The patient has one brother and one sister. There is a significant family history of cancer and this includes one of the patient's mother is 2 sisters with ovarian and breast cancer. A first cousin of the patient had breast cancer in her 30s. She has been tested for the BRCA gene, but the patient does not know the results. There were also brothers of the patient's mother with esophageal pancreas and colon cancer. The patient has been scheduled for genetic testing.   GYNECOLOGIC HISTORY: Menarche age 75, first live birth age 57, she is GX P2. The patient's periods are irregular, but still ongoing. She did take birth control pills for approximately 10 years with no adverse events.  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 Her husband [name] is a Academic librarian. He owns his own business. He is originally from [place]. Their children are [name] 17 and [name] 14.    ADVANCED DIRECTIVES: Not in place. If both she and her husband become disabled and cannot make her own health care decisions, the patient intends to name her friend [name] Scutari as healthcare power of attorney. Otherwise of course she and [name] will be each other's health care power of attorney.   HEALTH MAINTENANCE: History  Substance Use Topics  . Smoking status: Never Smoker   . Smokeless tobacco: Never Used  . Alcohol Use: No     Colonoscopy: N/A  PAP: 12/06/2011  Bone density: N/A  Lipid panel: Not on file  Allergies  Allergen Reactions  . Adhesive (Tape)     Pt doesn't like it  . Penicillins Nausea Only and Rash    Current Outpatient Prescriptions  Medication Sig Dispense Refill  . Alum & Mag Hydroxide-Simeth (MAGIC MOUTHWASH  W/LIDOCAINE) SOLN Take 5 mLs by mouth 4 (four) times daily as needed (mouth pain).  240 mL  2  . buPROPion (WELLBUTRIN XL) 300 MG 24 hr tablet Take 300 mg by mouth every morning.       Marland Kitchen dexamethasone (DECADRON) 4 MG tablet Take 2 tablets by mouth twice daily with food for 3 days after each chemo.  30 tablet  1  . escitalopram (LEXAPRO) 20 MG tablet Take 10 mg by mouth every morning. Takes 1/2 tablet      . lidocaine-prilocaine (EMLA) cream Apply to port 1-2 hr before each procedure  30 g  2  . LORazepam (ATIVAN) 0.5 MG tablet Take 1 tablet (0.5 mg total) by mouth at bedtime as needed (anxiety, insomnia, or nausea).  30 tablet  0  . methocarbamol (ROBAXIN) 500 MG tablet Take 1 tablet (500 mg total) by mouth 4 (four) times daily.  40 tablet  1  .  metoCLOPramide (REGLAN) 10 MG tablet Take 1 tablet (10 mg total) by mouth 4 (four) times daily. As needed for nausea  30 tablet  2  . ondansetron (ZOFRAN-ODT) 8 MG disintegrating tablet Take 8 mg by mouth every 8 (eight) hours as needed for nausea.      . prochlorperazine (COMPAZINE) 10 MG tablet Take 1 tablet (10 mg total) by mouth every 6 (six) hours as needed (Nausea or vomiting).  30 tablet  1   No current facility-administered medications for this visit.    OBJECTIVE: Middle-aged white woman  in no acute distress Filed Vitals:   08/22/12 1359  BP: 116/75  Pulse: 80  Temp: 98.5 F (36.9 C)  Resp: 20     Body mass index is 26.66 kg/(m^2).    ECOG FS: 1 Filed Weights   08/22/12 1359  Weight: 165 lb 1.6 oz (74.889 kg)   General appearance: Alert, cooperative, well nourished, no apparent distress Head: Normocephalic, without obvious abnormality, atraumatic, the patient is wearing a wig, one area of mucositis in her left buccal mucosa area Eyes: Conjunctivae/corneas clear, PERRLA, EOMI Nose: Nares, septum and mucosa are normal, no drainage or sinus tenderness Neck: No adenopathy, supple, symmetrical, trachea midline, thyroid not enlarged, no  tenderness Resp: Clear to auscultation bilaterally Cardio: Regular rate and rhythm, S1, S2 normal, no murmur, click, rub or gallop, right chest Port-A-Cath without signs of infection Breasts:  Deferred GI: Soft, not distended, non-tender, hypoactive bowel sounds, no organomegaly Extremities: Extremities normal, atraumatic, no cyanosis or edema Lymph nodes: Cervical, supraclavicular, and axillary nodes normal Neurologic: Grossly normal   LAB RESULTS: Lab Results  Component Value Date   WBC 8.1 08/22/2012   NEUTROABS 5.8 08/22/2012   HGB 11.7 08/22/2012   HCT 33.1* 08/22/2012   MCV 93.3 08/22/2012   PLT 246 08/22/2012      Chemistry      Component Value Date/Time   NA 137 08/22/2012 1316   NA 135 06/16/2012 0435   K 3.9 08/22/2012 1316   K 3.2* 06/16/2012 0435   CL 105 08/07/2012 0808   CL 101 06/16/2012 0435   CO2 28 08/22/2012 1316   CO2 27 06/16/2012 0435   BUN 7.3 08/22/2012 1316   BUN 4* 06/16/2012 0435   CREATININE 0.8 08/22/2012 1316   CREATININE 0.66 06/16/2012 0435      Component Value Date/Time   CALCIUM 9.8 08/22/2012 1316   CALCIUM 8.4 06/16/2012 0435   ALKPHOS 80 08/22/2012 1316   ALKPHOS 54 06/07/2012 1053   AST 19 08/22/2012 1316   AST 27 06/07/2012 1053   ALT 21 08/22/2012 1316   ALT 16 06/07/2012 1053   BILITOT 0.33 08/22/2012 1316   BILITOT 0.3 06/07/2012 1053     Lab Results  Component Value Date   LABCA2 23 03/28/2012     STUDIES: Echocardiogram on 07/23/2012 showed an ejection fraction of 60%.    ASSESSMENT: 47 y.o. BRCA-2 positive [place], [place] woman:  (1)  Status post right breast biopsy 03/20/2012 for a clinical T1b N0, stage IA invasive ductal carcinoma, grade 1-2, triple negative, with an MIB-1 of 100%.  (2) Biopsy of a suspicious left breast mass was benign.  (3) Status post bilateral mastectomies with right sentinel lymph node sampling and immediate expander placement 06/14/2012, for a right-sided pT1c pN0, stage IA invasive ductal carcinoma, grade 1, with ample  margins, triple negative, with an MIB-1 of 100%. (The left breast was benign).  (4)  Being treated in the adjuvant  setting, the plan being to complete 4 dose dense cycles of Doxorubicin/Cyclophosphamide followed by 4 dose dense cycles of Paclitaxel.  (5) Patient will not need adjuvant radiation. Once she is done with active treatment for her breast cancer she will proceed to bilateral salpingo-oophorectomy at her and Dr. Dawayne Patricia discretion.  (6) Post-chemotherapy headaches   PLAN:  Norma [name] was provided a prescription for Lorazepam #30 with 0 refills today.  She was also provided with a wig voucher at the [facility].    She is tolerating chemotherapy well with post-chemotherapy nausea that is fairly well controlled with her antiemetic regimen.  We have discussed taking Aleve twice a day with meals Monday through Friday on the week of chemotherapy for her post-chemotherapy headaches.  We plan to see Norma [name] again on 08/27/2012 at which time she is scheduled to proceed with cycle 3 of dose dense Doxorubicin/Cyclophosphamide.  Laboratories of CBC and CMP will be checked prior to proceeding with chemotherapy.   All questions answered.  Norma [name] was encouraged to contact us in the interim for any questions, concerns, or problems.    Larina Bras NP-C  08/22/2012 6:24 PM

## 2012-08-27 ENCOUNTER — Ambulatory Visit (HOSPITAL_BASED_OUTPATIENT_CLINIC_OR_DEPARTMENT_OTHER): Payer: BC Managed Care – PPO

## 2012-08-27 ENCOUNTER — Ambulatory Visit (HOSPITAL_BASED_OUTPATIENT_CLINIC_OR_DEPARTMENT_OTHER): Payer: BC Managed Care – PPO | Admitting: Physician Assistant

## 2012-08-27 ENCOUNTER — Encounter: Payer: Self-pay | Admitting: Physician Assistant

## 2012-08-27 ENCOUNTER — Other Ambulatory Visit (HOSPITAL_BASED_OUTPATIENT_CLINIC_OR_DEPARTMENT_OTHER): Payer: BC Managed Care – PPO | Admitting: Lab

## 2012-08-27 VITALS — BP 100/71 | HR 71 | Temp 98.1°F | Resp 20 | Ht 66.0 in | Wt 168.3 lb

## 2012-08-27 DIAGNOSIS — Z5111 Encounter for antineoplastic chemotherapy: Secondary | ICD-10-CM

## 2012-08-27 DIAGNOSIS — C50919 Malignant neoplasm of unspecified site of unspecified female breast: Secondary | ICD-10-CM

## 2012-08-27 DIAGNOSIS — D696 Thrombocytopenia, unspecified: Secondary | ICD-10-CM

## 2012-08-27 DIAGNOSIS — Z171 Estrogen receptor negative status [ER-]: Secondary | ICD-10-CM

## 2012-08-27 DIAGNOSIS — D649 Anemia, unspecified: Secondary | ICD-10-CM | POA: Insufficient documentation

## 2012-08-27 DIAGNOSIS — Z1509 Genetic susceptibility to other malignant neoplasm: Secondary | ICD-10-CM

## 2012-08-27 DIAGNOSIS — C50419 Malignant neoplasm of upper-outer quadrant of unspecified female breast: Secondary | ICD-10-CM

## 2012-08-27 DIAGNOSIS — C50411 Malignant neoplasm of upper-outer quadrant of right female breast: Secondary | ICD-10-CM

## 2012-08-27 DIAGNOSIS — Z9010 Acquired absence of unspecified breast and nipple: Secondary | ICD-10-CM

## 2012-08-27 LAB — CBC WITH DIFFERENTIAL/PLATELET
Basophils Absolute: 0 10*3/uL (ref 0.0–0.1)
Eosinophils Absolute: 0 10*3/uL (ref 0.0–0.5)
HGB: 10 g/dL — ABNORMAL LOW (ref 11.6–15.9)
MCV: 92.7 fL (ref 79.5–101.0)
MONO#: 0.7 10*3/uL (ref 0.1–0.9)
MONO%: 9.3 % (ref 0.0–14.0)
NEUT#: 5.9 10*3/uL (ref 1.5–6.5)
RDW: 13.4 % (ref 11.2–14.5)

## 2012-08-27 MED ORDER — DEXAMETHASONE SODIUM PHOSPHATE 20 MG/5ML IJ SOLN
12.0000 mg | Freq: Once | INTRAMUSCULAR | Status: AC
  Administered 2012-08-27: 20 mg via INTRAVENOUS

## 2012-08-27 MED ORDER — SODIUM CHLORIDE 0.9 % IV SOLN
600.0000 mg/m2 | Freq: Once | INTRAVENOUS | Status: AC
  Administered 2012-08-27: 1140 mg via INTRAVENOUS
  Filled 2012-08-27: qty 57

## 2012-08-27 MED ORDER — SODIUM CHLORIDE 0.9 % IV SOLN
150.0000 mg | Freq: Once | INTRAVENOUS | Status: AC
  Administered 2012-08-27: 150 mg via INTRAVENOUS
  Filled 2012-08-27: qty 5

## 2012-08-27 MED ORDER — PALONOSETRON HCL INJECTION 0.25 MG/5ML
0.2500 mg | Freq: Once | INTRAVENOUS | Status: AC
  Administered 2012-08-27: 0.25 mg via INTRAVENOUS

## 2012-08-27 MED ORDER — SODIUM CHLORIDE 0.9 % IV SOLN
1000.0000 mL | INTRAVENOUS | Status: DC
  Administered 2012-08-27: 10:00:00 via INTRAVENOUS

## 2012-08-27 MED ORDER — HEPARIN SOD (PORK) LOCK FLUSH 100 UNIT/ML IV SOLN
500.0000 [IU] | Freq: Once | INTRAVENOUS | Status: AC | PRN
  Administered 2012-08-27: 500 [IU]
  Filled 2012-08-27: qty 5

## 2012-08-27 MED ORDER — LORAZEPAM 2 MG/ML IJ SOLN
0.5000 mg | Freq: Once | INTRAMUSCULAR | Status: AC
  Administered 2012-08-27: 2 mg via INTRAVENOUS

## 2012-08-27 MED ORDER — SODIUM CHLORIDE 0.9 % IJ SOLN
10.0000 mL | INTRAMUSCULAR | Status: DC | PRN
  Administered 2012-08-27: 10 mL
  Filled 2012-08-27: qty 10

## 2012-08-27 MED ORDER — SODIUM CHLORIDE 0.9 % IV SOLN
INTRAVENOUS | Status: DC
  Administered 2012-08-27: 10:00:00 via INTRAVENOUS

## 2012-08-27 MED ORDER — DOXORUBICIN HCL CHEMO IV INJECTION 2 MG/ML
60.0000 mg/m2 | Freq: Once | INTRAVENOUS | Status: AC
  Administered 2012-08-27: 114 mg via INTRAVENOUS
  Filled 2012-08-27: qty 57

## 2012-08-27 NOTE — Progress Notes (Signed)
ID: Norma [name]   DOB: 1965/09/19  MR#: [id number]  HKV#:[id number]  PCP: Selena Batten GYN: Gerald Leitz SU: Cicero Duck OTHER MD: Lurline Hare, Rogelia Mire, [place], Etter Sjogren   HISTORY OF PRESENT ILLNESS: Norma [name] had routine screening mammography at [facility] 11/16/2010 suggesting additional imaging on the right. This was performed the next day, and found no significant residual abnormality. Repeat screening mammography 03/15/2012 again suggested an area of architectural distortion in the right breast. In the same quadrant as before. Additional views 03/19/2012 found a 3 mm hypoechoic mass in the area in question. Biopsy of this mass 03/20/2012 showed an invasive ductal carcinoma, grade 1-2, triple negative, with an MIB-1 of 100%.  Breast MRI 03/23/2012 showed an area of poorly defined enhancement in the right breast associated with clip artifact. This measured 1 cm. In the left breast there was an enhancing mass measuring 2.0 cm. There were no other suspicious findings. The patient's subsequent history is as detailed below.  INTERVAL HISTORY: Norma [name] returns today accompanied by her sister, Norma [name], for followup of her right breast cancer.  She is due for day 1 cycle 3 of 4 planned dose dense cycles of doxorubicin/cyclophosphamide being given in the adjuvant setting. She receives supportive IV fluids and Neulasta on day 2 of each cycle.  Overall Norma [name] is feeling well, and feels like her second cycle of chemotherapy was "much better". It did take a little longer for her to recover, but she had no actual emesis, and only mild nausea.  REVIEW OF SYSTEMS: Norma [name] denies any fevers, chills, or night sweats. She's had no rashes or skin changes. She denies any signs of abnormal bleeding. She's eating and drinking well, currently keeping herself well hydrated, and denies any change in bowel or bladder habits. She's had no new cough, increased shortness of breath, or chest pain. She does have some  headaches following treatment, likely associated with the ondansetron. She's had no dizziness or change in vision. She currently denies any unusual myalgias, arthralgias, bony pain, peripheral swelling, or peripheral neuropathy.  A detailed review of systems is otherwise noncontributory.   PAST MEDICAL HISTORY: Past Medical History  Diagnosis Date  . Breast cancer   . PONV (postoperative nausea and vomiting)   . Depression     PAST SURGICAL HISTORY: Past Surgical History  Procedure Laterality Date  . Anterior cruciate ligament repair  2010  . Endometrial ablation      2012  . Dilation and curettage of uterus    . Basal cell carcinoma excision      OFF NOSE  2011   . Simple mastectomy w/ sentinel node biopsy Bilateral 06/15/2012    Dr Jamey Ripa  . Simple mastectomy with axillary sentinel node biopsy Right 06/14/2012    Procedure: SIMPLE MASTECTOMY WITH AXILLARY SENTINEL NODE BIOPSY;  Surgeon: Currie Paris, MD;  Location: [facility] OR;  Service: General;  Laterality: Right;  . Total mastectomy Left 06/14/2012    Procedure: TOTAL MASTECTOMY;  Surgeon: Currie Paris, MD;  Location: [facility] OR;  Service: General;  Laterality: Left;  . Breast reconstruction with placement of tissue expander and flex hd (acellular hydrated dermis) Bilateral 06/14/2012    Procedure: BILATERAL BREAST RECONSTRUCTION WITH PLACEMENT OF BILATERAL TISSUE EXPANDERS;  Surgeon: Etter Sjogren, MD;  Location: [facility] OR;  Service: Plastics;  Laterality: Bilateral;  Moh's surgery for basal cell, Left nares  FAMILY HISTORY Family History  Problem Relation Age of Onset  . Breast cancer Maternal Aunt     dx in  her 18s  . Ovarian cancer Maternal Aunt     dx in her 5s  . Esophageal cancer Maternal Uncle   . Liver cancer Maternal Uncle   . Lung cancer Maternal Uncle   . BRCA 1/2 Maternal Uncle     BRCA 2 +  . Breast cancer Cousin     dx in her 30s; BRCA2+  . Pancreatic cancer Maternal Uncle     dx in his 32s  . BRCA 1/2  Maternal Uncle     BRCA2+  . Colon cancer Maternal Uncle    the patient's parents are alive, in their early 5s. The patient has one brother and one sister. There is a significant family history of cancer and this includes one of the patient's mother is 2 sisters with ovarian and breast cancer. A first cousin of the patient had breast cancer in her 30s. She has been tested for the BRCA gene, but the patient does not know the results. There were also brothers of the patient's mother with esophageal pancreas and colon cancer. The patient has been scheduled for genetic testing.  GYNECOLOGIC HISTORY: Menarche age 21, first live birth age 74, she is GX P2. The patient's periods are increase in bili scans and irregular, but still ongoing. She did take birth control pills for approximately 10 years with no untoward events.  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 Her husband Norma [name] is a Academic librarian. He owns his own business. He is originally from [place]. Their children are Norma [name] 17 and Norma [name] 14.     ADVANCED DIRECTIVES: Not in place. If both she and her husband become disabled and cannot make her own health care decisions, the patient intends to name her friend Norma [name] as healthcare power of attorney. Otherwise of course she and Norma [name] will be each other self care power of attorney  HEALTH MAINTENANCE: History  Substance Use Topics  . Smoking status: Never Smoker   . Smokeless tobacco: Never Used  . Alcohol Use: No     Colonoscopy:  PAP:  Bone density:  Lipid panel:  Allergies  Allergen Reactions  . Adhesive (Tape)     Pt doesn't like it  . Penicillins Nausea Only and Rash    Current Outpatient Prescriptions  Medication Sig Dispense Refill  . Alum & Mag Hydroxide-Simeth (MAGIC MOUTHWASH W/LIDOCAINE) SOLN Take 5 mLs by mouth 4 (four) times daily as needed (mouth pain).  240 mL  2  . buPROPion (WELLBUTRIN XL) 300 MG 24 hr  tablet Take 300 mg by mouth every morning.       Marland Kitchen dexamethasone (DECADRON) 4 MG tablet Take 2 tablets by mouth twice daily with food for 3 days after each chemo.  30 tablet  1  . escitalopram (LEXAPRO) 20 MG tablet Take 10 mg by mouth every morning. Takes 1/2 tablet      . lidocaine-prilocaine (EMLA) cream Apply to port 1-2 hr before each procedure  30 g  2  . LORazepam (ATIVAN) 0.5 MG tablet Take 1 tablet (0.5 mg total) by mouth at bedtime as needed (anxiety, insomnia, or nausea).  30 tablet  0  . methocarbamol (ROBAXIN) 500 MG tablet Take 1 tablet (500 mg total) by mouth 4 (four) times daily.  40 tablet  1  . metoCLOPramide (REGLAN) 10 MG tablet Take 1 tablet (10 mg total) by mouth 4 (four) times daily. As needed for nausea  30 tablet  2  .  ondansetron (ZOFRAN-ODT) 8 MG disintegrating tablet Take 8 mg by mouth every 8 (eight) hours as needed for nausea.      . prochlorperazine (COMPAZINE) 10 MG tablet Take 1 tablet (10 mg total) by mouth every 6 (six) hours as needed (Nausea or vomiting).  30 tablet  1   No current facility-administered medications for this visit.   Facility-Administered Medications Ordered in Other Visits  Medication Dose Route Frequency Provider Last Rate Last Dose  . 0.9 %  sodium chloride infusion   Intravenous Continuous Lowella Dell, MD 20 mL/hr at 08/27/12 1000    . 0.9 %  sodium chloride infusion  1,000 mL Intravenous Continuous Rosea Dory G Ardra Kuznicki, PA-C 500 mL/hr at 08/27/12 1000    . cyclophosphamide (CYTOXAN) 1,140 mg in sodium chloride 0.9 % 250 mL chemo infusion  600 mg/m2 (Treatment Plan Actual) Intravenous Once Khalil Szczepanik G Keeva Reisen, PA-C      . DOXOrubicin (ADRIAMYCIN) chemo injection 114 mg  60 mg/m2 (Treatment Plan Actual) Intravenous Once Brielle Moro G Laysha Childers, PA-C      . fosaprepitant (EMEND) 150 mg in sodium chloride 0.9 % 145 mL IVPB  150 mg Intravenous Once Aldon Hengst G Angelik Walls, PA-C 300 mL/hr at 08/27/12 1014 150 mg at 08/27/12 1014  . heparin lock flush 100 unit/mL  500 Units  Intracatheter Once PRN Ritchard Paragas Allegra Grana, PA-C      . LORazepam (ATIVAN) injection 0.5 mg  0.5 mg Intravenous Once Zakaria Fromer G Aubrie Lucien, PA-C      . palonosetron (ALOXI) injection 0.25 mg  0.25 mg Intravenous Once Ananda Caya G Nelton Amsden, PA-C      . sodium chloride 0.9 % injection 10 mL  10 mL Intracatheter PRN Arcola Freshour Allegra Grana, PA-C        OBJECTIVE: Middle-aged white woman  in no acute distress Filed Vitals:   08/27/12 0854  BP: 100/71  Pulse: 71  Temp: 98.1 F (36.7 C)  Resp: 20     Body mass index is 27.18 kg/(m^2).    ECOG FS: 1 Filed Weights   08/27/12 0854  Weight: 168 lb 4.8 oz (76.34 kg)    Sclerae unicteric Oropharynx clear, no ulcerations or evidence of thrush No cervical or supraclavicular adenopathy Lungs clear to auscultation bilaterally, no rales or rhonchi Heart regular rate and rhythm Abdomen soft, nontender to palpation, with positive bowel sounds MSK no focal spinal tenderness, no peripheral edema Neuro: nonfocal, well oriented, pleasant affect Breasts: Patient status post bilateral mastectomies. She has expanders in place. Axillae are benign bilaterally, with no adenopathy palpated. Port is intact in the right upper chest wall.    LAB RESULTS: Lab Results  Component Value Date   WBC 7.6 08/27/2012   NEUTROABS 5.9 08/27/2012   HGB 10.0* 08/27/2012   HCT 29.0* 08/27/2012   MCV 92.7 08/27/2012   PLT 137* 08/27/2012      Chemistry      Component Value Date/Time   NA 137 08/22/2012 1316   NA 135 06/16/2012 0435   K 3.9 08/22/2012 1316   K 3.2* 06/16/2012 0435   CL 105 08/07/2012 0808   CL 101 06/16/2012 0435   CO2 28 08/22/2012 1316   CO2 27 06/16/2012 0435   BUN 7.3 08/22/2012 1316   BUN 4* 06/16/2012 0435   CREATININE 0.8 08/22/2012 1316   CREATININE 0.66 06/16/2012 0435      Component Value Date/Time   CALCIUM 9.8 08/22/2012 1316   CALCIUM 8.4 06/16/2012 0435   ALKPHOS 80 08/22/2012 1316   ALKPHOS  54 06/07/2012 1053   AST 19 08/22/2012 1316   AST 27 06/07/2012 1053   ALT 21 08/22/2012 1316   ALT 16  06/07/2012 1053   BILITOT 0.33 08/22/2012 1316   BILITOT 0.3 06/07/2012 1053       Lab Results  Component Value Date   LABCA2 23 03/28/2012     STUDIES:  Echocardiogram on 07/23/2012 showed an ejection fraction of 60%.    ASSESSMENT: 47 y.o. BRCA-2 positive Norma [name] woman   (1)  status post right breast biopsy 03/20/2012 for a clinical T1b N0, stage IA invasive ductal carcinoma, grade 1-2, triple negative, with an MIB-1 of 100%  (2) biopsy of a suspicious left breast mass was benign  (3) status post bilateral mastectomies with right sentinel lymph node sampling and immediate expander placement 06/14/2012, for a right-sided pT1c pN0, stage IA invasive ductal carcinoma, grade 1, with ample margins, triple negative, with an MIB-1 of 100%. (The left breast was benign)  (4)  being treated in the adjuvant setting, the plan being to complete 4 dose dense cycles of doxorubicin/ cyclophosphamide followed by 4 dose dense cycles of paclitaxel.  (5) Patient will not need adjuvant radiation. Once she is done with active treatment for her breast cancer she will proceed to bilateral salpingo-oophorectomy at her and Dr. Dawayne Patricia discretion.   PLAN:  Norma [name] will proceed to treatment today as scheduled for her third dose dense cycle of doxorubicin/cyclophosphamide. She will return tomorrow for supportive IV fluids along with her Neulasta injection, and I'll see her next week on July 22 for assessment of chemotoxicity. We will continue with her current anti-nausea regimen which seems to have controlled her nausea very well following cycle 2. She also understands that she may utilize her antinausea meds as needed, even after the first 3-4 days.   Norma [name] voices understanding and agreement with this plan, and will call with any changes or problems prior to her next appointment.   Kwame Ryland    08/27/2012

## 2012-08-27 NOTE — Patient Instructions (Addendum)
[facility] Discharge Instructions for Patients Receiving Chemotherapy  Today you received the following chemotherapy agents Adriamycin/Cytoxan.   To help prevent nausea and vomiting after your treatment, we encourage you to take your nausea medication as directed.    If you develop nausea and vomiting that is not controlled by your nausea medication, call the clinic.   BELOW ARE SYMPTOMS THAT SHOULD BE REPORTED IMMEDIATELY:  *FEVER GREATER THAN 100.5 F  *CHILLS WITH OR WITHOUT FEVER  NAUSEA AND VOMITING THAT IS NOT CONTROLLED WITH YOUR NAUSEA MEDICATION  *UNUSUAL SHORTNESS OF BREATH  *UNUSUAL BRUISING OR BLEEDING  TENDERNESS IN MOUTH AND THROAT WITH OR WITHOUT PRESENCE OF ULCERS  *URINARY PROBLEMS  *BOWEL PROBLEMS  UNUSUAL RASH Items with * indicate a potential emergency and should be followed up as soon as possible.  Feel free to call the clinic you have any questions or concerns. The clinic phone number is [phone].    

## 2012-08-28 ENCOUNTER — Ambulatory Visit: Payer: BC Managed Care – PPO

## 2012-08-28 ENCOUNTER — Telehealth: Payer: Self-pay | Admitting: Oncology

## 2012-08-28 ENCOUNTER — Ambulatory Visit (HOSPITAL_BASED_OUTPATIENT_CLINIC_OR_DEPARTMENT_OTHER): Payer: BC Managed Care – PPO

## 2012-08-28 VITALS — BP 106/71 | HR 66 | Temp 97.7°F

## 2012-08-28 DIAGNOSIS — Z1509 Genetic susceptibility to other malignant neoplasm: Secondary | ICD-10-CM

## 2012-08-28 DIAGNOSIS — C50411 Malignant neoplasm of upper-outer quadrant of right female breast: Secondary | ICD-10-CM

## 2012-08-28 DIAGNOSIS — C50419 Malignant neoplasm of upper-outer quadrant of unspecified female breast: Secondary | ICD-10-CM

## 2012-08-28 DIAGNOSIS — Z5189 Encounter for other specified aftercare: Secondary | ICD-10-CM

## 2012-08-28 MED ORDER — PEGFILGRASTIM INJECTION 6 MG/0.6ML
6.0000 mg | Freq: Once | SUBCUTANEOUS | Status: AC
  Administered 2012-08-28: 6 mg via SUBCUTANEOUS
  Filled 2012-08-28: qty 0.6

## 2012-08-28 MED ORDER — SODIUM CHLORIDE 0.9 % IV SOLN
1000.0000 mL | INTRAVENOUS | Status: DC
  Administered 2012-08-28: 10:00:00 via INTRAVENOUS

## 2012-08-28 MED ORDER — SODIUM CHLORIDE 0.9 % IJ SOLN
10.0000 mL | INTRAMUSCULAR | Status: DC | PRN
  Administered 2012-08-28: 10 mL via INTRAVENOUS
  Filled 2012-08-28: qty 10

## 2012-08-28 MED ORDER — LORAZEPAM 2 MG/ML IJ SOLN
0.5000 mg | Freq: Once | INTRAMUSCULAR | Status: AC
  Administered 2012-08-28: 0.5 mg via INTRAVENOUS

## 2012-08-28 MED ORDER — HEPARIN SOD (PORK) LOCK FLUSH 100 UNIT/ML IV SOLN
500.0000 [IU] | Freq: Once | INTRAVENOUS | Status: AC
  Administered 2012-08-28: 500 [IU] via INTRAVENOUS
  Filled 2012-08-28: qty 5

## 2012-08-28 NOTE — Telephone Encounter (Signed)
Added additional appts starting 7/29 as per 7/14 pof. Per pof pt will pick up schedule at next appt on 7/22 which is already on schedule.

## 2012-08-28 NOTE — Patient Instructions (Signed)
Dehydration, Adult Dehydration is when you lose more fluids from the body than you take in. Vital organs like the kidneys, brain, and heart cannot function without a proper amount of fluids and salt. Any loss of fluids from the body can cause dehydration.  CAUSES   Vomiting.  Diarrhea.  Excessive sweating.  Excessive urine output.  Fever. SYMPTOMS  Mild dehydration  Thirst.  Dry lips.  Slightly dry mouth. Moderate dehydration  Very dry mouth.  Sunken eyes.  Skin does not bounce back quickly when lightly pinched and released.  Dark urine and decreased urine production.  Decreased tear production.  Headache. Severe dehydration  Very dry mouth.  Extreme thirst.  Rapid, weak pulse (more than 100 beats per minute at rest).  Cold hands and feet.  Not able to sweat in spite of heat and temperature.  Rapid breathing.  Blue lips.  Confusion and lethargy.  Difficulty being awakened.  Minimal urine production.  No tears. DIAGNOSIS  Your caregiver will diagnose dehydration based on your symptoms and your exam. Blood and urine tests will help confirm the diagnosis. The diagnostic evaluation should also identify the cause of dehydration. TREATMENT  Treatment of mild or moderate dehydration can often be done at home by increasing the amount of fluids that you drink. It is best to drink small amounts of fluid more often. Drinking too much at one time can make vomiting worse. Refer to the home care instructions below. Severe dehydration needs to be treated at the hospital where you will probably be given intravenous (IV) fluids that contain water and electrolytes. HOME CARE INSTRUCTIONS   Ask your caregiver about specific rehydration instructions.  Drink enough fluids to keep your urine clear or pale yellow.  Drink small amounts frequently if you have nausea and vomiting.  Eat as you normally do.  Avoid:  Foods or drinks high in sugar.  Carbonated  drinks.  Juice.  Extremely hot or cold fluids.  Drinks with caffeine.  Fatty, greasy foods.  Alcohol.  Tobacco.  Overeating.  Gelatin desserts.  Wash your hands well to avoid spreading bacteria and viruses.  Only take over-the-counter or prescription medicines for pain, discomfort, or fever as directed by your caregiver.  Ask your caregiver if you should continue all prescribed and over-the-counter medicines.  Keep all follow-up appointments with your caregiver. SEEK MEDICAL CARE IF:  You have abdominal pain and it increases or stays in one area (localizes).  You have a rash, stiff neck, or severe headache.  You are irritable, sleepy, or difficult to awaken.  You are weak, dizzy, or extremely thirsty. SEEK IMMEDIATE MEDICAL CARE IF:   You are unable to keep fluids down or you get worse despite treatment.  You have frequent episodes of vomiting or diarrhea.  You have blood or green matter (bile) in your vomit.  You have blood in your stool or your stool looks black and tarry.  You have not urinated in 6 to 8 hours, or you have only urinated a small amount of very dark urine.  You have a fever.  You faint. MAKE SURE YOU:   Understand these instructions.  Will watch your condition.  Will get help right away if you are not doing well or get worse. Document Released: 01/31/2005 Document Revised: 04/25/2011 Document Reviewed: 09/20/2010 Exit[employer] Patient Information 2014 [employer].  

## 2012-09-04 ENCOUNTER — Encounter: Payer: Self-pay | Admitting: Physician Assistant

## 2012-09-04 ENCOUNTER — Ambulatory Visit (HOSPITAL_BASED_OUTPATIENT_CLINIC_OR_DEPARTMENT_OTHER): Payer: BC Managed Care – PPO | Admitting: Physician Assistant

## 2012-09-04 ENCOUNTER — Telehealth: Payer: Self-pay | Admitting: *Deleted

## 2012-09-04 ENCOUNTER — Other Ambulatory Visit (HOSPITAL_BASED_OUTPATIENT_CLINIC_OR_DEPARTMENT_OTHER): Payer: BC Managed Care – PPO | Admitting: Lab

## 2012-09-04 ENCOUNTER — Other Ambulatory Visit: Payer: Self-pay | Admitting: Physician Assistant

## 2012-09-04 VITALS — BP 96/63 | HR 75 | Temp 98.3°F | Resp 20 | Ht 66.0 in | Wt 167.0 lb

## 2012-09-04 DIAGNOSIS — D702 Other drug-induced agranulocytosis: Secondary | ICD-10-CM

## 2012-09-04 DIAGNOSIS — C50411 Malignant neoplasm of upper-outer quadrant of right female breast: Secondary | ICD-10-CM

## 2012-09-04 DIAGNOSIS — C50419 Malignant neoplasm of upper-outer quadrant of unspecified female breast: Secondary | ICD-10-CM

## 2012-09-04 DIAGNOSIS — D649 Anemia, unspecified: Secondary | ICD-10-CM

## 2012-09-04 DIAGNOSIS — Z1509 Genetic susceptibility to other malignant neoplasm: Secondary | ICD-10-CM

## 2012-09-04 DIAGNOSIS — Z1501 Genetic susceptibility to malignant neoplasm of breast: Secondary | ICD-10-CM

## 2012-09-04 DIAGNOSIS — C50919 Malignant neoplasm of unspecified site of unspecified female breast: Secondary | ICD-10-CM

## 2012-09-04 LAB — COMPREHENSIVE METABOLIC PANEL (CC13)
AST: 12 U/L (ref 5–34)
Albumin: 3.8 g/dL (ref 3.5–5.0)
Alkaline Phosphatase: 84 U/L (ref 40–150)
BUN: 9.7 mg/dL (ref 7.0–26.0)
Creatinine: 0.8 mg/dL (ref 0.6–1.1)
Glucose: 87 mg/dl (ref 70–140)
Potassium: 3.8 mEq/L (ref 3.5–5.1)
Total Bilirubin: 0.3 mg/dL (ref 0.20–1.20)

## 2012-09-04 LAB — CBC WITH DIFFERENTIAL/PLATELET
Basophils Absolute: 0 10*3/uL (ref 0.0–0.1)
Eosinophils Absolute: 0 10*3/uL (ref 0.0–0.5)
HCT: 27.1 % — ABNORMAL LOW (ref 34.8–46.6)
HGB: 9.7 g/dL — ABNORMAL LOW (ref 11.6–15.9)
LYMPH%: 45.9 % (ref 14.0–49.7)
MCV: 93.2 fL (ref 79.5–101.0)
MONO%: 25.5 % — ABNORMAL HIGH (ref 0.0–14.0)
NEUT#: 0.3 10*3/uL — CL (ref 1.5–6.5)
NEUT%: 26.2 % — ABNORMAL LOW (ref 38.4–76.8)
Platelets: 184 10*3/uL (ref 145–400)
RDW: 13.2 % (ref 11.2–14.5)

## 2012-09-04 NOTE — Telephone Encounter (Signed)
Noted refill via fax received early am today for keflex and brought to this RN's attention late in day. Pt was seen today by AB/PA. Keflex not on medication list ( or in history of medications ).  No mention of recommendation to initiate abx therapy.  AB/PA has left for the day. This RN attempted to contact patient and obtained VM, message left to return call regarding refill request.

## 2012-09-04 NOTE — Progress Notes (Signed)
ID: Norma [name]   DOB: [birth year]  MR#: [id number]  BMW#:[id number]  PCP: Selena Batten GYN: Gerald Leitz SU: Cicero Duck OTHER MD: Lurline Hare, Rogelia Mire, Barrett, Etter Sjogren   HISTORY OF PRESENT ILLNESS: [name] had routine screening mammography at [facility] 11/16/2010 suggesting additional imaging on the right. This was performed the next day, and found no significant residual abnormality. Repeat screening mammography 03/15/2012 again suggested an area of architectural distortion in the right breast. In the same quadrant as before. Additional views 03/19/2012 found a 3 mm hypoechoic mass in the area in question. Biopsy of this mass 03/20/2012 showed an invasive ductal carcinoma, grade 1-2, triple negative, with an MIB-1 of 100%.  Breast MRI 03/23/2012 showed an area of poorly defined enhancement in the right breast associated with clip artifact. This measured 1 cm. In the left breast there was an enhancing mass measuring 2.0 cm. There were no other suspicious findings. The patient's subsequent history is as detailed below.  INTERVAL HISTORY: Norma [name] returns alone today for followup of her right breast cancer.  She is currently day 9 cycle 3 of 4 planned dose dense cycles of doxorubicin/cyclophosphamide being given in the adjuvant setting. She receives supportive IV fluids and Neulasta on day 2 of each cycle.  Norma [name] feels that she tolerated cycle 3 very well, and is recovering much easier than with previous cycles. She definitely finds benefit with the supportive IV fluids. She took her antinausea medications appropriately, and actually had less nausea this time around.   REVIEW OF SYSTEMS: [name] denies any fevers, chills, or night sweats. She's had no rashes or skin changes and denies abnormal bruising. She has a small hemorrhoid occasionally causes spotting on the tolerance issue, but this is mild and intermittent. She's had no diarrhea or constipation. She's had no new cough, increased  shortness of breath, or chest pain. She again had some headaches following treatment, but these resolved with the use of Benadryl and Aleve. She's had no dizziness or change in vision.  She also denies any unusual myalgias, arthralgias, bony pain, peripheral swelling, or peripheral neuropathy.  A detailed review of systems is otherwise noncontributory.   PAST MEDICAL HISTORY: Past Medical History  Diagnosis Date  . Breast cancer   . PONV (postoperative nausea and vomiting)   . Depression     PAST SURGICAL HISTORY: Past Surgical History  Procedure Laterality Date  . Anterior cruciate ligament repair  2010  . Endometrial ablation      2012  . Dilation and curettage of uterus    . Basal cell carcinoma excision      OFF NOSE  2011   . Simple mastectomy w/ sentinel node biopsy Bilateral 06/15/2012    Dr Jamey Ripa  . Simple mastectomy with axillary sentinel node biopsy Right 06/14/2012    Procedure: SIMPLE MASTECTOMY WITH AXILLARY SENTINEL NODE BIOPSY;  Surgeon: Currie Paris, MD;  Location: [facility] OR;  Service: General;  Laterality: Right;  . Total mastectomy Left 06/14/2012    Procedure: TOTAL MASTECTOMY;  Surgeon: Currie Paris, MD;  Location: [facility] OR;  Service: General;  Laterality: Left;  . Breast reconstruction with placement of tissue expander and flex hd (acellular hydrated dermis) Bilateral 06/14/2012    Procedure: BILATERAL BREAST RECONSTRUCTION WITH PLACEMENT OF BILATERAL TISSUE EXPANDERS;  Surgeon: Etter Sjogren, MD;  Location: [facility] OR;  Service: Plastics;  Laterality: Bilateral;  Moh's surgery for basal cell, Left nares  FAMILY HISTORY Family History  Problem Relation Age of Onset  . Breast  cancer Maternal Aunt     dx in her 31s  . Ovarian cancer Maternal Aunt     dx in her 43s  . Esophageal cancer Maternal Uncle   . Liver cancer Maternal Uncle   . Lung cancer Maternal Uncle   . BRCA 1/2 Maternal Uncle     BRCA 2 +  . Breast cancer Cousin     dx in her 30s; BRCA2+  .  Pancreatic cancer Maternal Uncle     dx in his 37s  . BRCA 1/2 Maternal Uncle     BRCA2+  . Colon cancer Maternal Uncle    the patient's parents are alive, in their early 7s. The patient has one brother and one sister. There is a significant family history of cancer and this includes one of the patient's mother is 2 sisters with ovarian and breast cancer. A first cousin of the patient had breast cancer in her 30s. She has been tested for the BRCA gene, but the patient does not know the results. There were also brothers of the patient's mother with esophageal pancreas and colon cancer. The patient has been scheduled for genetic testing.  GYNECOLOGIC HISTORY: Menarche age 39, first live birth age 54, she is GX P2. The patient's periods are increase in bili scans and irregular, but still ongoing. She did take birth control pills for approximately 10 years with no untoward events.  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 Her husband [name] is a Academic librarian. He owns his own business. He is originally from [place]. Their children are Norma [name] 17 and Norma [name] 14.     ADVANCED DIRECTIVES: Not in place. If both she and her husband become disabled and cannot make her own health care decisions, the patient intends to name her friend [name] as healthcare power of attorney. Otherwise of course she and [name] will be each other self care power of attorney  HEALTH MAINTENANCE: History  Substance Use Topics  . Smoking status: Never Smoker   . Smokeless tobacco: Never Used  . Alcohol Use: No     Colonoscopy:  PAP:  Bone density:  Lipid panel:  Allergies  Allergen Reactions  . Adhesive (Tape)     Pt doesn't like it  . Penicillins Nausea Only and Rash    Current Outpatient Prescriptions  Medication Sig Dispense Refill  . Alum & Mag Hydroxide-Simeth (MAGIC MOUTHWASH W/LIDOCAINE) SOLN Take 5 mLs by mouth 4 (four) times daily as needed  (mouth pain).  240 mL  2  . buPROPion (WELLBUTRIN XL) 300 MG 24 hr tablet Take 300 mg by mouth every morning.       Marland Kitchen dexamethasone (DECADRON) 4 MG tablet Take 2 tablets by mouth twice daily with food for 3 days after each chemo.  30 tablet  1  . docusate sodium (COLACE) 100 MG capsule Take 100 mg by mouth as needed for constipation.      Marland Kitchen escitalopram (LEXAPRO) 20 MG tablet Take 10 mg by mouth every morning. Takes 1/2 tablet      . lidocaine-prilocaine (EMLA) cream Apply to port 1-2 hr before each procedure  30 g  2  . LORazepam (ATIVAN) 0.5 MG tablet Take 1 tablet (0.5 mg total) by mouth at bedtime as needed (anxiety, insomnia, or nausea).  30 tablet  0  . methocarbamol (ROBAXIN) 500 MG tablet Take 1 tablet (500 mg total) by mouth 4 (four) times daily.  40 tablet  1  . metoCLOPramide (REGLAN) 10 MG tablet Take 1 tablet (10 mg total) by mouth 4 (four) times daily. As needed for nausea  30 tablet  2  . ondansetron (ZOFRAN-ODT) 8 MG disintegrating tablet Take 8 mg by mouth every 8 (eight) hours as needed for nausea.      . prochlorperazine (COMPAZINE) 10 MG tablet Take 1 tablet (10 mg total) by mouth every 6 (six) hours as needed (Nausea or vomiting).  30 tablet  1   No current facility-administered medications for this visit.    OBJECTIVE: Middle-aged white woman  in no acute distress Filed Vitals:   09/04/12 0929  BP: 96/63  Pulse: 75  Temp: 98.3 F (36.8 C)  Resp: 20     Body mass index is 26.97 kg/(m^2).    ECOG FS: 1 Filed Weights   09/04/12 0929  Weight: 167 lb (75.751 kg)    Sclerae unicteric Oropharynx clear, no ulcerations or evidence of thrush No cervical or supraclavicular adenopathy Lungs clear to auscultation bilaterally, no crackles or wheezes Heart regular rate and rhythm Abdomen soft, nontender to palpation, with positive bowel sounds MSK no focal spinal tenderness,  No peripheral edema Neuro: nonfocal, well oriented, positive affect Breasts: Patient status post  bilateral mastectomies with expanders in place. Axillae are benign bilaterally, with no adenopathy palpated. Port is intact in the right upper chest wall, no erythema or edema.    LAB RESULTS: Lab Results  Component Value Date   WBC 1.2* 09/04/2012   NEUTROABS 0.3* 09/04/2012   HGB 9.7* 09/04/2012   HCT 27.1* 09/04/2012   MCV 93.2 09/04/2012   PLT 184 09/04/2012      Chemistry      Component Value Date/Time   NA 137 08/22/2012 1316   NA 135 06/16/2012 0435   K 3.9 08/22/2012 1316   K 3.2* 06/16/2012 0435   CL 105 08/07/2012 0808   CL 101 06/16/2012 0435   CO2 28 08/22/2012 1316   CO2 27 06/16/2012 0435   BUN 7.3 08/22/2012 1316   BUN 4* 06/16/2012 0435   CREATININE 0.8 08/22/2012 1316   CREATININE 0.66 06/16/2012 0435      Component Value Date/Time   CALCIUM 9.8 08/22/2012 1316   CALCIUM 8.4 06/16/2012 0435   ALKPHOS 80 08/22/2012 1316   ALKPHOS 54 06/07/2012 1053   AST 19 08/22/2012 1316   AST 27 06/07/2012 1053   ALT 21 08/22/2012 1316   ALT 16 06/07/2012 1053   BILITOT 0.33 08/22/2012 1316   BILITOT 0.3 06/07/2012 1053       Lab Results  Component Value Date   LABCA2 23 03/28/2012     STUDIES:  Echocardiogram on 07/23/2012 showed an ejection fraction of 60%.    ASSESSMENT: 47 y.o. BRCA-2 positive Pura Spice woman   (1)  status post right breast biopsy 03/20/2012 for a clinical T1b N0, stage IA invasive ductal carcinoma, grade 1-2, triple negative, with an MIB-1 of 100%  (2) biopsy of a suspicious left breast mass was benign  (3) status post bilateral mastectomies with right sentinel lymph node sampling and immediate expander placement 06/14/2012, for a right-sided pT1c pN0, stage IA invasive ductal carcinoma, grade 1, with ample margins, triple negative, with an MIB-1 of 100%. (The left breast was benign)  (4)  being treated in the adjuvant setting, the plan being to complete 4 dose dense cycles of doxorubicin/ cyclophosphamide followed by 4 dose dense cycles of paclitaxel.  (5) Patient will  not need adjuvant radiation.  Once she is done with active treatment for her breast cancer she will proceed to bilateral salpingo-oophorectomy at her and Dr. Dawayne Patricia discretion.   PLAN:  Tayen is recovering well from cycle 3 of doxorubicin/cyclophosphamide. I will see her again next week on July 28 in anticipation of her fourth and final dose. She'll see Dr. Darnelle Catalan the following week for assessment of chemotoxicity on  August 5, and at that time we'll also review her upcoming regimen of dose dense paclitaxel.  Joelle voices understanding and agreement with this plan, and will call with any changes or problems prior to her next appointment.   Robinn Overholt    09/04/2012

## 2012-09-05 ENCOUNTER — Other Ambulatory Visit: Payer: Self-pay | Admitting: *Deleted

## 2012-09-05 MED ORDER — CEPHALEXIN 500 MG PO CAPS: 500.0000 mg | ORAL_CAPSULE | Freq: Two times a day (BID) | ORAL | Status: DC

## 2012-09-10 ENCOUNTER — Ambulatory Visit (HOSPITAL_BASED_OUTPATIENT_CLINIC_OR_DEPARTMENT_OTHER): Payer: BC Managed Care – PPO

## 2012-09-10 ENCOUNTER — Other Ambulatory Visit (HOSPITAL_BASED_OUTPATIENT_CLINIC_OR_DEPARTMENT_OTHER): Payer: BC Managed Care – PPO | Admitting: Lab

## 2012-09-10 ENCOUNTER — Telehealth: Payer: Self-pay | Admitting: Oncology

## 2012-09-10 ENCOUNTER — Ambulatory Visit (HOSPITAL_BASED_OUTPATIENT_CLINIC_OR_DEPARTMENT_OTHER): Payer: BC Managed Care – PPO | Admitting: Physician Assistant

## 2012-09-10 ENCOUNTER — Encounter: Payer: Self-pay | Admitting: Physician Assistant

## 2012-09-10 VITALS — BP 102/71 | HR 106 | Temp 98.1°F | Resp 20 | Ht 66.0 in | Wt 161.5 lb

## 2012-09-10 DIAGNOSIS — C50919 Malignant neoplasm of unspecified site of unspecified female breast: Secondary | ICD-10-CM

## 2012-09-10 DIAGNOSIS — Z171 Estrogen receptor negative status [ER-]: Secondary | ICD-10-CM

## 2012-09-10 DIAGNOSIS — C50411 Malignant neoplasm of upper-outer quadrant of right female breast: Secondary | ICD-10-CM

## 2012-09-10 DIAGNOSIS — Z1509 Genetic susceptibility to other malignant neoplasm: Secondary | ICD-10-CM

## 2012-09-10 DIAGNOSIS — C50419 Malignant neoplasm of upper-outer quadrant of unspecified female breast: Secondary | ICD-10-CM

## 2012-09-10 DIAGNOSIS — Z5111 Encounter for antineoplastic chemotherapy: Secondary | ICD-10-CM

## 2012-09-10 DIAGNOSIS — D649 Anemia, unspecified: Secondary | ICD-10-CM

## 2012-09-10 DIAGNOSIS — Z1501 Genetic susceptibility to malignant neoplasm of breast: Secondary | ICD-10-CM

## 2012-09-10 LAB — CBC WITH DIFFERENTIAL/PLATELET
Basophils Absolute: 0 10*3/uL (ref 0.0–0.1)
EOS%: 0 % (ref 0.0–7.0)
Eosinophils Absolute: 0 10*3/uL (ref 0.0–0.5)
HCT: 30 % — ABNORMAL LOW (ref 34.8–46.6)
HGB: 10.2 g/dL — ABNORMAL LOW (ref 11.6–15.9)
MCH: 32 pg (ref 25.1–34.0)
MCV: 94 fL (ref 79.5–101.0)
MONO%: 11.8 % (ref 0.0–14.0)
NEUT#: 5.3 10*3/uL (ref 1.5–6.5)
NEUT%: 75.9 % (ref 38.4–76.8)
Platelets: 160 10*3/uL (ref 145–400)

## 2012-09-10 MED ORDER — LORAZEPAM 2 MG/ML IJ SOLN
0.5000 mg | Freq: Once | INTRAMUSCULAR | Status: AC
  Administered 2012-09-10: 0.5 mg via INTRAVENOUS

## 2012-09-10 MED ORDER — CYCLOPHOSPHAMIDE CHEMO INJECTION 1 GM
600.0000 mg/m2 | Freq: Once | INTRAMUSCULAR | Status: AC
  Administered 2012-09-10: 1140 mg via INTRAVENOUS
  Filled 2012-09-10: qty 57

## 2012-09-10 MED ORDER — DOXORUBICIN HCL CHEMO IV INJECTION 2 MG/ML
60.0000 mg/m2 | Freq: Once | INTRAVENOUS | Status: AC
  Administered 2012-09-10: 114 mg via INTRAVENOUS
  Filled 2012-09-10: qty 57

## 2012-09-10 MED ORDER — SODIUM CHLORIDE 0.9 % IJ SOLN
10.0000 mL | INTRAMUSCULAR | Status: DC | PRN
  Administered 2012-09-10: 10 mL
  Filled 2012-09-10: qty 10

## 2012-09-10 MED ORDER — SODIUM CHLORIDE 0.9 % IV SOLN
1000.0000 mL | INTRAVENOUS | Status: DC
  Administered 2012-09-10: 11:00:00 via INTRAVENOUS

## 2012-09-10 MED ORDER — PALONOSETRON HCL INJECTION 0.25 MG/5ML
0.2500 mg | Freq: Once | INTRAVENOUS | Status: AC
  Administered 2012-09-10: 0.25 mg via INTRAVENOUS

## 2012-09-10 MED ORDER — DEXAMETHASONE SODIUM PHOSPHATE 20 MG/5ML IJ SOLN
12.0000 mg | Freq: Once | INTRAMUSCULAR | Status: AC
  Administered 2012-09-10: 12 mg via INTRAVENOUS

## 2012-09-10 MED ORDER — SODIUM CHLORIDE 0.9 % IV SOLN
150.0000 mg | Freq: Once | INTRAVENOUS | Status: AC
  Administered 2012-09-10: 150 mg via INTRAVENOUS
  Filled 2012-09-10: qty 5

## 2012-09-10 MED ORDER — HEPARIN SOD (PORK) LOCK FLUSH 100 UNIT/ML IV SOLN
500.0000 [IU] | Freq: Once | INTRAVENOUS | Status: AC | PRN
  Administered 2012-09-10: 500 [IU]
  Filled 2012-09-10: qty 5

## 2012-09-10 NOTE — Telephone Encounter (Signed)
Per 7/28 pof cx'd 8/5 f/u and moved 8/5 lb to 8/4. gv pt appt schedule for July thru October.

## 2012-09-10 NOTE — Patient Instructions (Addendum)
[facility] Discharge Instructions for Patients Receiving Chemotherapy  Today you received the following chemotherapy agents Adriamycin/Cytoxan To help prevent nausea and vomiting after your treatment, we encourage you to take your nausea medication as prescribed. If you develop nausea and vomiting that is not controlled by your nausea medication, call the clinic.   BELOW ARE SYMPTOMS THAT SHOULD BE REPORTED IMMEDIATELY:  *FEVER GREATER THAN 100.5 F  *CHILLS WITH OR WITHOUT FEVER  NAUSEA AND VOMITING THAT IS NOT CONTROLLED WITH YOUR NAUSEA MEDICATION  *UNUSUAL SHORTNESS OF BREATH  *UNUSUAL BRUISING OR BLEEDING  TENDERNESS IN MOUTH AND THROAT WITH OR WITHOUT PRESENCE OF ULCERS  *URINARY PROBLEMS  *BOWEL PROBLEMS  UNUSUAL RASH Items with * indicate a potential emergency and should be followed up as soon as possible.  Feel free to call the clinic you have any questions or concerns. The clinic phone number is [phone].    

## 2012-09-10 NOTE — Progress Notes (Signed)
ID: [name]   DOB: [birth year]  MR#: [id number]  WUJ#:[id number]  PCP: Selena Batten GYN: Gerald Leitz SU: Cicero Duck OTHER MD: Lurline Hare, Rogelia Mire, Trego, Etter Sjogren   HISTORY OF PRESENT ILLNESS: [name] had routine screening mammography at [facility] 11/16/2010 suggesting additional imaging on the right. This was performed the next day, and found no significant residual abnormality. Repeat screening mammography 03/15/2012 again suggested an area of architectural distortion in the right breast. In the same quadrant as before. Additional views 03/19/2012 found a 3 mm hypoechoic mass in the area in question. Biopsy of this mass 03/20/2012 showed an invasive ductal carcinoma, grade 1-2, triple negative, with an MIB-1 of 100%.  Breast MRI 03/23/2012 showed an area of poorly defined enhancement in the right breast associated with clip artifact. This measured 1 cm. In the left breast there was an enhancing mass measuring 2.0 cm. There were no other suspicious findings. The patient's subsequent history is as detailed below.  INTERVAL HISTORY: Rhaya returns  today accompanied by her sister, Athira Janowicz, for followup of her right breast cancer.  She is due for day 1 cycle 4 of 4 planned dose dense cycles of doxorubicin/cyclophosphamide being given in the adjuvant setting. She receives supportive IV fluids and Neulasta on day 2 of each cycle.  Fannie feels well today. She tells she has "4 great days" just before receiving her next dose of chemotherapy. Her energy level today is good. She currently denies any problems with nausea and is having regular bowel movements. Her white counts have recovered nicely, and she is completing her course of Keflex prophylactically. She's had no fevers or chills.    REVIEW OF SYSTEMS: Claudeen denies any rashes or skin changes and denies abnormal bruising or bleeding.  She's had no new cough, increased shortness of breath, or chest pain. She currently denies any  abnormal headaches and has had no dizziness or change in vision.  She also denies any unusual myalgias, arthralgias, bony pain, peripheral swelling, or peripheral neuropathy.  A detailed review of systems is otherwise noncontributory.   PAST MEDICAL HISTORY: Past Medical History  Diagnosis Date  . Breast cancer   . PONV (postoperative nausea and vomiting)   . Depression     PAST SURGICAL HISTORY: Past Surgical History  Procedure Laterality Date  . Anterior cruciate ligament repair  2010  . Endometrial ablation      2012  . Dilation and curettage of uterus    . Basal cell carcinoma excision      OFF NOSE  2011   . Simple mastectomy w/ sentinel node biopsy Bilateral 06/15/2012    Dr Jamey Ripa  . Simple mastectomy with axillary sentinel node biopsy Right 06/14/2012    Procedure: SIMPLE MASTECTOMY WITH AXILLARY SENTINEL NODE BIOPSY;  Surgeon: Currie Paris, MD;  Location: [facility] OR;  Service: General;  Laterality: Right;  . Total mastectomy Left 06/14/2012    Procedure: TOTAL MASTECTOMY;  Surgeon: Currie Paris, MD;  Location: [facility] OR;  Service: General;  Laterality: Left;  . Breast reconstruction with placement of tissue expander and flex hd (acellular hydrated dermis) Bilateral 06/14/2012    Procedure: BILATERAL BREAST RECONSTRUCTION WITH PLACEMENT OF BILATERAL TISSUE EXPANDERS;  Surgeon: Etter Sjogren, MD;  Location: [facility] OR;  Service: Plastics;  Laterality: Bilateral;  Moh's surgery for basal cell, Left nares  FAMILY HISTORY Family History  Problem Relation Age of Onset  . Breast cancer Maternal Aunt     dx in her 48s  . Ovarian  cancer Maternal Aunt     dx in her 73s  . Esophageal cancer Maternal Uncle   . Liver cancer Maternal Uncle   . Lung cancer Maternal Uncle   . BRCA 1/2 Maternal Uncle     BRCA 2 +  . Breast cancer Cousin     dx in her 30s; BRCA2+  . Pancreatic cancer Maternal Uncle     dx in his 67s  . BRCA 1/2 Maternal Uncle     BRCA2+  . Colon cancer Maternal Uncle     the patient's parents are alive, in their early 48s. The patient has one brother and one sister. There is a significant family history of cancer and this includes one of the patient's mother is 2 sisters with ovarian and breast cancer. A first cousin of the patient had breast cancer in her 30s. She has been tested for the BRCA gene, but the patient does not know the results. There were also brothers of the patient's mother with esophageal pancreas and colon cancer. The patient has been scheduled for genetic testing.  GYNECOLOGIC HISTORY: Menarche age 48, first live birth age 68, she is GX P2. The patient's periods are increase in bili scans and irregular, but still ongoing. She did take birth control pills for approximately 10 years with no untoward events.  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 Her husband [name] is a Academic librarian. He owns his own business. He is originally from [place]. Their children are [name] 17 and [name] 14.     ADVANCED DIRECTIVES: Not in place. If both she and her husband become disabled and cannot make her own health care decisions, the patient intends to name her friend Stephinie Battisti Scutari as healthcare power of attorney. Otherwise of course she and [name] will be each other self care power of attorney  HEALTH MAINTENANCE: History  Substance Use Topics  . Smoking status: Never Smoker   . Smokeless tobacco: Never Used  . Alcohol Use: No     Colonoscopy:  PAP:  Bone density:  Lipid panel:  Allergies  Allergen Reactions  . Adhesive (Tape)     Pt doesn't like it  . Penicillins Nausea Only and Rash    Current Outpatient Prescriptions  Medication Sig Dispense Refill  . Alum & Mag Hydroxide-Simeth (MAGIC MOUTHWASH W/LIDOCAINE) SOLN Take 5 mLs by mouth 4 (four) times daily as needed (mouth pain).  240 mL  2  . buPROPion (WELLBUTRIN XL) 300 MG 24 hr tablet Take 300 mg by mouth every morning.       .  cephALEXin (KEFLEX) 500 MG capsule Take 1 capsule (500 mg total) by mouth 2 (two) times daily.  14 capsule  3  . dexamethasone (DECADRON) 4 MG tablet TAKE 2 TABLETS BY MOUTH TWICE DAILY WITH FOOD FOR 3 DAYS AFTER EACH CHEMOTHERAPY SESSION  30 tablet  0  . docusate sodium (COLACE) 100 MG capsule Take 100 mg by mouth as needed for constipation.      Marland Kitchen escitalopram (LEXAPRO) 20 MG tablet Take 10 mg by mouth every morning. Takes 1/2 tablet      . lidocaine-prilocaine (EMLA) cream Apply to port 1-2 hr before each procedure  30 g  2  . LORazepam (ATIVAN) 0.5 MG tablet Take 1 tablet (0.5 mg total) by mouth at bedtime as needed (anxiety, insomnia, or nausea).  30 tablet  0  . methocarbamol (ROBAXIN) 500 MG tablet Take 1 tablet (500 mg  total) by mouth 4 (four) times daily.  40 tablet  1  . metoCLOPramide (REGLAN) 10 MG tablet Take 1 tablet (10 mg total) by mouth 4 (four) times daily. As needed for nausea  30 tablet  2  . ondansetron (ZOFRAN-ODT) 8 MG disintegrating tablet Take 8 mg by mouth every 8 (eight) hours as needed for nausea.      . prochlorperazine (COMPAZINE) 10 MG tablet Take 1 tablet (10 mg total) by mouth every 6 (six) hours as needed (Nausea or vomiting).  30 tablet  1   No current facility-administered medications for this visit.    OBJECTIVE: Middle-aged white woman  in no acute distress Filed Vitals:   09/10/12 0902  BP: 102/71  Pulse: 106  Temp: 98.1 F (36.7 C)  Resp: 20     Body mass index is 26.08 kg/(m^2).    ECOG FS: 1 Filed Weights   09/10/12 0902  Weight: 161 lb 8 oz (73.256 kg)    Sclerae unicteric Oropharynx clear, no ulcerations or evidence of thrush No cervical or supraclavicular adenopathy Lungs clear to auscultation bilaterally, no crackles or wheezes Heart regular rate and rhythm, no murmur appreciated Abdomen soft, nontender to palpation, with positive bowel sounds MSK no focal spinal tenderness,  No peripheral edema Neuro: nonfocal, well oriented, positive  affect Breasts: Patient status post bilateral mastectomies with expanders in place. Axillae are benign bilaterally, with no adenopathy palpated. Port is intact in the right upper chest wall, no erythema or edema.    LAB RESULTS: Lab Results  Component Value Date   WBC 7.0 09/10/2012   NEUTROABS 5.3 09/10/2012   HGB 10.2* 09/10/2012   HCT 30.0* 09/10/2012   MCV 94.0 09/10/2012   PLT 160 09/10/2012      Chemistry      Component Value Date/Time   NA 138 09/04/2012 0910   NA 135 06/16/2012 0435   K 3.8 09/04/2012 0910   K 3.2* 06/16/2012 0435   CL 105 08/07/2012 0808   CL 101 06/16/2012 0435   CO2 23 09/04/2012 0910   CO2 27 06/16/2012 0435   BUN 9.7 09/04/2012 0910   BUN 4* 06/16/2012 0435   CREATININE 0.8 09/04/2012 0910   CREATININE 0.66 06/16/2012 0435      Component Value Date/Time   CALCIUM 9.2 09/04/2012 0910   CALCIUM 8.4 06/16/2012 0435   ALKPHOS 84 09/04/2012 0910   ALKPHOS 54 06/07/2012 1053   AST 12 09/04/2012 0910   AST 27 06/07/2012 1053   ALT 17 09/04/2012 0910   ALT 16 06/07/2012 1053   BILITOT 0.30 09/04/2012 0910   BILITOT 0.3 06/07/2012 1053       Lab Results  Component Value Date   LABCA2 23 03/28/2012     STUDIES:  Echocardiogram on 07/23/2012 showed an ejection fraction of 60%.    ASSESSMENT: 47 y.o. BRCA-2 positive Pura Spice woman   (1)  status post right breast biopsy 03/20/2012 for a clinical T1b N0, stage IA invasive ductal carcinoma, grade 1-2, triple negative, with an MIB-1 of 100%  (2) biopsy of a suspicious left breast mass was benign  (3) status post bilateral mastectomies with right sentinel lymph node sampling and immediate expander placement 06/14/2012, for a right-sided pT1c pN0, stage IA invasive ductal carcinoma, grade 1, with ample margins, triple negative, with an MIB-1 of 100%. (The left breast was benign)  (4)  being treated in the adjuvant setting, the plan being to complete 4 dose dense cycles of doxorubicin/ cyclophosphamide followed  by 4 dose  dense cycles of paclitaxel.  (5) Patient will not need adjuvant radiation. Once she is done with active treatment for her breast cancer she will proceed to bilateral salpingo-oophorectomy at her and Dr. Dawayne Patricia discretion.   PLAN:  Reinette will proceed to treatment today as scheduled for her fourth and final dose dense cycle of doxorubicin/cyclophosphamide. She receives supportive IV fluids tomorrow when she returns for her Neulasta injection.   She is planning a beach trip next week, August 4 through 8, and will come in on August 4  for labs only prior to going out of town. Per her request, we will cancel her followup appointment with Dr. Darnelle Catalan on August 5. I will see her, however, the following week on August 11 for followup prior to initiating day 1 cycle 1 of dose dense paclitaxel.  Novice voices understanding and agreement with this plan, and will call with any changes or problems prior to her next appointment.   Kinzee Happel    09/10/2012

## 2012-09-11 ENCOUNTER — Ambulatory Visit: Payer: BC Managed Care – PPO

## 2012-09-11 ENCOUNTER — Ambulatory Visit (HOSPITAL_BASED_OUTPATIENT_CLINIC_OR_DEPARTMENT_OTHER): Payer: BC Managed Care – PPO

## 2012-09-11 VITALS — BP 102/55 | HR 74 | Temp 98.0°F | Resp 18

## 2012-09-11 DIAGNOSIS — Z5189 Encounter for other specified aftercare: Secondary | ICD-10-CM

## 2012-09-11 DIAGNOSIS — C50411 Malignant neoplasm of upper-outer quadrant of right female breast: Secondary | ICD-10-CM

## 2012-09-11 DIAGNOSIS — Z1501 Genetic susceptibility to malignant neoplasm of breast: Secondary | ICD-10-CM

## 2012-09-11 DIAGNOSIS — C50419 Malignant neoplasm of upper-outer quadrant of unspecified female breast: Secondary | ICD-10-CM

## 2012-09-11 DIAGNOSIS — Z1509 Genetic susceptibility to other malignant neoplasm: Secondary | ICD-10-CM

## 2012-09-11 MED ORDER — LORAZEPAM 2 MG/ML IJ SOLN
0.5000 mg | Freq: Once | INTRAMUSCULAR | Status: AC
  Administered 2012-09-11: 0.5 mg via INTRAVENOUS

## 2012-09-11 MED ORDER — PEGFILGRASTIM INJECTION 6 MG/0.6ML
6.0000 mg | Freq: Once | SUBCUTANEOUS | Status: AC
  Administered 2012-09-11: 6 mg via SUBCUTANEOUS
  Filled 2012-09-11: qty 0.6

## 2012-09-11 MED ORDER — SODIUM CHLORIDE 0.9 % IV SOLN
1000.0000 mL | INTRAVENOUS | Status: DC
  Administered 2012-09-11: 1000 mL via INTRAVENOUS

## 2012-09-11 NOTE — Patient Instructions (Addendum)
Dehydration, Adult Dehydration is when you lose more fluids from the body than you take in. Vital organs like the kidneys, brain, and heart cannot function without a proper amount of fluids and salt. Any loss of fluids from the body can cause dehydration.  CAUSES   Vomiting.  Diarrhea.  Excessive sweating.  Excessive urine output.  Fever. SYMPTOMS  Mild dehydration  Thirst.  Dry lips.  Slightly dry mouth. Moderate dehydration  Very dry mouth.  Sunken eyes.  Skin does not bounce back quickly when lightly pinched and released.  Dark urine and decreased urine production.  Decreased tear production.  Headache. Severe dehydration  Very dry mouth.  Extreme thirst.  Rapid, weak pulse (more than 100 beats per minute at rest).  Cold hands and feet.  Not able to sweat in spite of heat and temperature.  Rapid breathing.  Blue lips.  Confusion and lethargy.  Difficulty being awakened.  Minimal urine production.  No tears. DIAGNOSIS  Your caregiver will diagnose dehydration based on your symptoms and your exam. Blood and urine tests will help confirm the diagnosis. The diagnostic evaluation should also identify the cause of dehydration. TREATMENT  Treatment of mild or moderate dehydration can often be done at home by increasing the amount of fluids that you drink. It is best to drink small amounts of fluid more often. Drinking too much at one time can make vomiting worse. Refer to the home care instructions below. Severe dehydration needs to be treated at the hospital where you will probably be given intravenous (IV) fluids that contain water and electrolytes. HOME CARE INSTRUCTIONS   Ask your caregiver about specific rehydration instructions.  Drink enough fluids to keep your urine clear or pale yellow.  Drink small amounts frequently if you have nausea and vomiting.  Eat as you normally do.  Avoid:  Foods or drinks high in sugar.  Carbonated  drinks.  Juice.  Extremely hot or cold fluids.  Drinks with caffeine.  Fatty, greasy foods.  Alcohol.  Tobacco.  Overeating.  Gelatin desserts.  Wash your hands well to avoid spreading bacteria and viruses.  Only take over-the-counter or prescription medicines for pain, discomfort, or fever as directed by your caregiver.  Ask your caregiver if you should continue all prescribed and over-the-counter medicines.  Keep all follow-up appointments with your caregiver. SEEK MEDICAL CARE IF:  You have abdominal pain and it increases or stays in one area (localizes).  You have a rash, stiff neck, or severe headache.  You are irritable, sleepy, or difficult to awaken.  You are weak, dizzy, or extremely thirsty. SEEK IMMEDIATE MEDICAL CARE IF:   You are unable to keep fluids down or you get worse despite treatment.  You have frequent episodes of vomiting or diarrhea.  You have blood or green matter (bile) in your vomit.  You have blood in your stool or your stool looks black and tarry.  You have not urinated in 6 to 8 hours, or you have only urinated a small amount of very dark urine.  You have a fever.  You faint. MAKE SURE YOU:   Understand these instructions.  Will watch your condition.  Will get help right away if you are not doing well or get worse. Document Released: 01/31/2005 Document Revised: 04/25/2011 Document Reviewed: 09/20/2010 Exit[employer] Patient Information 2014 [employer].  

## 2012-09-12 ENCOUNTER — Other Ambulatory Visit: Payer: Self-pay | Admitting: Certified Registered Nurse Anesthetist

## 2012-09-17 ENCOUNTER — Encounter: Payer: Self-pay | Admitting: Physician Assistant

## 2012-09-17 ENCOUNTER — Ambulatory Visit (HOSPITAL_BASED_OUTPATIENT_CLINIC_OR_DEPARTMENT_OTHER): Payer: BC Managed Care – PPO | Admitting: Lab

## 2012-09-17 DIAGNOSIS — C50419 Malignant neoplasm of upper-outer quadrant of unspecified female breast: Secondary | ICD-10-CM

## 2012-09-17 DIAGNOSIS — C50919 Malignant neoplasm of unspecified site of unspecified female breast: Secondary | ICD-10-CM

## 2012-09-17 LAB — COMPREHENSIVE METABOLIC PANEL (CC13)
ALT: 13 U/L (ref 0–55)
AST: 10 U/L (ref 5–34)
Albumin: 3.8 g/dL (ref 3.5–5.0)
Calcium: 9.2 mg/dL (ref 8.4–10.4)
Chloride: 105 mEq/L (ref 98–109)
Creatinine: 0.7 mg/dL (ref 0.6–1.1)
Potassium: 3.7 mEq/L (ref 3.5–5.1)

## 2012-09-17 LAB — CBC WITH DIFFERENTIAL/PLATELET
BASO%: 2.1 % — ABNORMAL HIGH (ref 0.0–2.0)
MCHC: 34.4 g/dL (ref 31.5–36.0)
MONO#: 0 10*3/uL — ABNORMAL LOW (ref 0.1–0.9)
RBC: 2.67 10*6/uL — ABNORMAL LOW (ref 3.70–5.45)
WBC: 0.5 10*3/uL — CL (ref 3.9–10.3)
lymph#: 0.4 10*3/uL — ABNORMAL LOW (ref 0.9–3.3)
nRBC: 0 % (ref 0–0)

## 2012-09-17 NOTE — Progress Notes (Signed)
Norma [name] is currently day 8 cycle 4 of dose dense doxorubicin/cyclophosphamide. She was in today for brief labs prior to leaving for a trip to the beach. She was found to be neutropenic today, and is being started back on Keflex which she is tolerated well in the past. She will take 500 mg by mouth twice a day for 7 days prophylactically.  Since she is headed out of town, we've given her a copy of today's labs, as well as her most recent office progress note and an order for a repeat CBC with differential if necessary. We also cautioned her to proceed to the emergency room, even if she is out of town, should she develop a temperature of 100.3 or above.  Otherwise, she is scheduled to see me again next week on August 11 in anticipation of day 1 cycle 1 of dose dense paclitaxel. She voices understanding and agreement with this plan.  Zollie Scale, PA-C 09/17/2012

## 2012-09-17 NOTE — Progress Notes (Unsigned)
Reviewed lab result with AB/PA per pt planning on leaving for beach ( see prior dictation ).  Obtain recommendations per AB/PA for pt to start prophylactic ABX now, Order for CBC with Diff given and instructions to proceed to ER of local hospital if temp goes > 100.3. Copy of most recent dictation given as well.  Pt verbalized understanding of above.

## 2012-09-18 ENCOUNTER — Other Ambulatory Visit: Payer: BC Managed Care – PPO | Admitting: Lab

## 2012-09-18 ENCOUNTER — Ambulatory Visit: Payer: BC Managed Care – PPO | Admitting: Oncology

## 2012-09-21 ENCOUNTER — Other Ambulatory Visit: Payer: BC Managed Care – PPO | Admitting: Lab

## 2012-09-21 ENCOUNTER — Other Ambulatory Visit: Payer: Self-pay | Admitting: Physician Assistant

## 2012-09-22 ENCOUNTER — Other Ambulatory Visit: Payer: Self-pay | Admitting: Oncology

## 2012-09-24 ENCOUNTER — Other Ambulatory Visit (HOSPITAL_BASED_OUTPATIENT_CLINIC_OR_DEPARTMENT_OTHER): Payer: BC Managed Care – PPO | Admitting: Lab

## 2012-09-24 ENCOUNTER — Ambulatory Visit (HOSPITAL_BASED_OUTPATIENT_CLINIC_OR_DEPARTMENT_OTHER): Payer: BC Managed Care – PPO

## 2012-09-24 ENCOUNTER — Ambulatory Visit (HOSPITAL_BASED_OUTPATIENT_CLINIC_OR_DEPARTMENT_OTHER): Payer: BC Managed Care – PPO | Admitting: Physician Assistant

## 2012-09-24 ENCOUNTER — Encounter: Payer: Self-pay | Admitting: Physician Assistant

## 2012-09-24 VITALS — BP 104/72 | HR 78 | Temp 98.4°F | Resp 20 | Ht 66.0 in | Wt 167.0 lb

## 2012-09-24 VITALS — BP 101/77 | HR 65 | Temp 97.9°F | Resp 16

## 2012-09-24 DIAGNOSIS — C50411 Malignant neoplasm of upper-outer quadrant of right female breast: Secondary | ICD-10-CM

## 2012-09-24 DIAGNOSIS — C50419 Malignant neoplasm of upper-outer quadrant of unspecified female breast: Secondary | ICD-10-CM

## 2012-09-24 DIAGNOSIS — Z1501 Genetic susceptibility to malignant neoplasm of breast: Secondary | ICD-10-CM

## 2012-09-24 DIAGNOSIS — C50919 Malignant neoplasm of unspecified site of unspecified female breast: Secondary | ICD-10-CM

## 2012-09-24 DIAGNOSIS — E860 Dehydration: Secondary | ICD-10-CM

## 2012-09-24 DIAGNOSIS — D649 Anemia, unspecified: Secondary | ICD-10-CM

## 2012-09-24 DIAGNOSIS — Z5111 Encounter for antineoplastic chemotherapy: Secondary | ICD-10-CM

## 2012-09-24 DIAGNOSIS — R110 Nausea: Secondary | ICD-10-CM

## 2012-09-24 DIAGNOSIS — Z171 Estrogen receptor negative status [ER-]: Secondary | ICD-10-CM

## 2012-09-24 DIAGNOSIS — D696 Thrombocytopenia, unspecified: Secondary | ICD-10-CM | POA: Insufficient documentation

## 2012-09-24 LAB — CBC WITH DIFFERENTIAL/PLATELET
BASO%: 0.3 % (ref 0.0–2.0)
Basophils Absolute: 0 10*3/uL (ref 0.0–0.1)
EOS%: 0 % (ref 0.0–7.0)
HCT: 27.5 % — ABNORMAL LOW (ref 34.8–46.6)
HGB: 9.2 g/dL — ABNORMAL LOW (ref 11.6–15.9)
LYMPH%: 15.6 % (ref 14.0–49.7)
MCH: 32.1 pg (ref 25.1–34.0)
MCHC: 33.5 g/dL (ref 31.5–36.0)
MCV: 95.8 fL (ref 79.5–101.0)
MONO%: 10.3 % (ref 0.0–14.0)
NEUT%: 73.8 % (ref 38.4–76.8)
Platelets: 125 10*3/uL — ABNORMAL LOW (ref 145–400)
lymph#: 1 10*3/uL (ref 0.9–3.3)

## 2012-09-24 MED ORDER — ONDANSETRON 8 MG/50ML IVPB (CHCC)
8.0000 mg | Freq: Once | INTRAVENOUS | Status: AC
  Administered 2012-09-24: 8 mg via INTRAVENOUS

## 2012-09-24 MED ORDER — DEXAMETHASONE SODIUM PHOSPHATE 20 MG/5ML IJ SOLN
20.0000 mg | Freq: Once | INTRAMUSCULAR | Status: AC
  Administered 2012-09-24: 20 mg via INTRAVENOUS

## 2012-09-24 MED ORDER — FAMOTIDINE IN NACL 20-0.9 MG/50ML-% IV SOLN
20.0000 mg | Freq: Once | INTRAVENOUS | Status: AC
Start: 2012-09-24 — End: 2012-09-24
  Administered 2012-09-24: 20 mg via INTRAVENOUS

## 2012-09-24 MED ORDER — SODIUM CHLORIDE 0.9 % IJ SOLN
10.0000 mL | INTRAMUSCULAR | Status: DC | PRN
  Administered 2012-09-24: 10 mL
  Filled 2012-09-24: qty 10

## 2012-09-24 MED ORDER — LORAZEPAM 2 MG/ML IJ SOLN
0.5000 mg | Freq: Once | INTRAMUSCULAR | Status: AC
  Administered 2012-09-24: 0.5 mg via INTRAVENOUS

## 2012-09-24 MED ORDER — PACLITAXEL CHEMO INJECTION 300 MG/50ML
175.0000 mg/m2 | Freq: Once | INTRAVENOUS | Status: AC
  Administered 2012-09-24: 324 mg via INTRAVENOUS
  Filled 2012-09-24: qty 54

## 2012-09-24 MED ORDER — HEPARIN SOD (PORK) LOCK FLUSH 100 UNIT/ML IV SOLN
500.0000 [IU] | Freq: Once | INTRAVENOUS | Status: AC | PRN
  Administered 2012-09-24: 500 [IU]
  Filled 2012-09-24: qty 5

## 2012-09-24 MED ORDER — DIPHENHYDRAMINE HCL 50 MG/ML IJ SOLN
25.0000 mg | Freq: Once | INTRAMUSCULAR | Status: AC
  Administered 2012-09-24: 25 mg via INTRAVENOUS

## 2012-09-24 NOTE — Patient Instructions (Addendum)
[facility] Discharge Instructions for Patients Receiving Chemotherapy  Today you received the following chemotherapy agents Taxol  To help prevent nausea and vomiting after your treatment, we encourage you to take your nausea medication    If you develop nausea and vomiting that is not controlled by your nausea medication, call the clinic.   BELOW ARE SYMPTOMS THAT SHOULD BE REPORTED IMMEDIATELY:  *FEVER GREATER THAN 100.5 F  *CHILLS WITH OR WITHOUT FEVER  NAUSEA AND VOMITING THAT IS NOT CONTROLLED WITH YOUR NAUSEA MEDICATION  *UNUSUAL SHORTNESS OF BREATH  *UNUSUAL BRUISING OR BLEEDING  TENDERNESS IN MOUTH AND THROAT WITH OR WITHOUT PRESENCE OF ULCERS  *URINARY PROBLEMS  *BOWEL PROBLEMS  UNUSUAL RASH Items with * indicate a potential emergency and should be followed up as soon as possible.  Feel free to call the clinic you have any questions or concerns. The clinic phone number is [phone].    

## 2012-09-24 NOTE — Progress Notes (Signed)
ID: Norma [name]   DOB: 1965-09-29  MR#: [id number]  WUJ#:[id number]  PCP: Norma [name] GYN: Norma [name] SU: Norma [name] OTHER MD: Norma [name], Norma [name], Norma [name], Norma [name]   HISTORY OF PRESENT ILLNESS: Norma [name] had routine screening mammography at [facility] 11/16/2010 suggesting additional imaging on the right. This was performed the next day, and found no significant residual abnormality. Repeat screening mammography 03/15/2012 again suggested an area of architectural distortion in the right breast. In the same quadrant as before. Additional views 03/19/2012 found a 3 mm hypoechoic mass in the area in question. Biopsy of this mass 03/20/2012 showed an invasive ductal carcinoma, grade 1-2, triple negative, with an MIB-1 of 100%.  Breast MRI 03/23/2012 showed an area of poorly defined enhancement in the right breast associated with clip artifact. This measured 1 cm. In the left breast there was an enhancing mass measuring 2.0 cm. There were no other suspicious findings. The patient's subsequent history is as detailed below.  INTERVAL HISTORY: Norma [name] returns  today accompanied by her husband, Norma [name], for followup of her right breast cancer.  She is due for day 1 cycle 1 of 4 planned dose dense cycles of paclitaxel being given in the adjuvant setting. She receives supportive IV fluids and Neulasta on day 2 of each cycle.  Norma [name] feels well today. She had a great time at the beach last week and was able to get some rest.  She has been on her Keflex prophylactically with no fevers.   Her white counts have recovered nicely.    REVIEW OF SYSTEMS: Norma [name] denies any rashes [name] skin changes and denies abnormal bruising [name] bleeding.  She's had no mouth ulcers, oral sensitivity [name] problems swallowing. Her appetite has been fairly good, and she denies any recent nausea [name] emesis. She has had some problems with constipation and is taking a stool softener regularly. She's had no new cough, increased  shortness of breath, [name] chest pain. She currently denies any abnormal headaches and has had no dizziness [name] change in vision.  She also denies any unusual myalgias, arthralgias, bony pain, peripheral swelling, [name] peripheral neuropathy at baseline.  A detailed review of systems is otherwise noncontributory.   PAST MEDICAL HISTORY: Past Medical History  Diagnosis Date  . Breast cancer   . PONV (postoperative nausea and vomiting)   . Depression     PAST SURGICAL HISTORY: Past Surgical History  Procedure Laterality Date  . Anterior cruciate ligament repair  2010  . Endometrial ablation      2012  . Dilation and curettage of uterus    . Basal cell carcinoma excision      OFF NOSE  2011   . Simple mastectomy w/ sentinel node biopsy Bilateral 06/15/2012    Dr [facility]a [name]  . Simple mastectomy with axillary sentinel node biopsy Right 06/14/2012    Procedure: SIMPLE MASTECTOMY WITH AXILLARY SENTINEL NODE BIOPSY;  Surgeon: Norma Paris, MD;  Location: [name];  Service: General;  Laterality: Right;  . Total mastectomy Left 06/14/2012    Procedure: TOTAL MASTECTOMY;  Surgeon: Norma Paris, MD;  [name];  Service: General;  Laterality: Left;  . Breast reconstruction with placement of tissue expander and flex hd (acellular hydrated dermis) Bilateral 06/14/2012    Procedure: BILATERAL BREAST RECONSTRUCTION WITH PLACEMENT OF BILATERAL TISSUE EXPANDERS;  Surgeon: Norma Sjogren, MD;  [name];  Service: Plastics;  Laterality: Bilateral;  Moh's surgery for basal cell, Left nares  FAMILY HISTORY Family History  Problem  Relation Age of Onset  . Breast cancer Maternal Aunt     dx in her 95s  . Ovarian cancer Maternal Aunt     dx in her 5s  . Esophageal cancer Maternal Uncle   . Liver cancer Maternal Uncle   . Lung cancer Maternal Uncle   . BRCA 1/2 Maternal Uncle     BRCA 2 +  . Breast cancer Cousin     dx in her 30s; BRCA2+  . Pancreatic cancer Maternal Uncle     dx in his 49s   . BRCA 1/2 Maternal Uncle     BRCA2+  . Colon cancer Maternal Uncle    the patient's parents are alive, in their early 67s. The patient has one brother and one sister. There is a significant family history of cancer and this includes one of the patient's mother is 2 sisters with ovarian and breast cancer. A first cousin of the patient had breast cancer in her 30s. She has been tested for the BRCA gene, but the patient does not know the results. There were also brothers of the patient's mother with esophageal pancreas and colon cancer. The patient has been scheduled for genetic testing.  GYNECOLOGIC HISTORY: Menarche age 32, first live birth age 39, she is GX P2. The patient's periods are increase in bili scans and irregular, but still ongoing. She did take birth control pills for approximately 10 years with no untoward events.  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 Her husband Norma [name] is a Academic librarian. He owns his own business. He is originally from [place]. Their children are Norma [name] 17 and Norma [name] 14.     ADVANCED DIRECTIVES: Not in place. If both she and her husband become disabled and cannot make her own health care decisions, the patient intends to name her friend Norma [name] as healthcare power of attorney. Otherwise of course she and Norma [name] will be each other self care power of attorney  HEALTH MAINTENANCE: History  Substance Use Topics  . Smoking status: Never Smoker   . Smokeless tobacco: Never Used  . Alcohol Use: No     Colonoscopy:  PAP:  Bone density:  Lipid panel:  Allergies  Allergen Reactions  . Adhesive (Tape) Rash    Rash  . Penicillins Nausea Only and Rash    Current Outpatient Prescriptions  Medication Sig Dispense Refill  . Alum & Mag Hydroxide-Simeth (MAGIC MOUTHWASH W/LIDOCAINE) SOLN Take 5 mLs by mouth 4 (four) times daily as needed (mouth pain).  240 mL  2  . buPROPion (WELLBUTRIN XL) 300 MG 24  hr tablet Take 300 mg by mouth every morning.       . cephALEXin (KEFLEX) 500 MG capsule Take 1 capsule (500 mg total) by mouth 2 (two) times daily.  14 capsule  3  . dexamethasone (DECADRON) 4 MG tablet TAKE 2 TABLETS BY MOUTH TWICE DAILY WITH FOOD FOR 3 DAYS AFTER EACH CHEMOTHERAPY SESSION  30 tablet  0  . docusate sodium (COLACE) 100 MG capsule Take 100 mg by mouth as needed for constipation.      Marland Kitchen escitalopram (LEXAPRO) 20 MG tablet Take 10 mg by mouth every morning. Takes 1/2 tablet      . lidocaine-prilocaine (EMLA) cream Apply to port 1-2 hr before each procedure  30 g  2  . LORazepam (ATIVAN) 0.5 MG tablet Take 1 tablet (0.5 mg total) by mouth at bedtime as needed (anxiety, insomnia, [name]  nausea).  30 tablet  0  . methocarbamol (ROBAXIN) 500 MG tablet Take 1 tablet (500 mg total) by mouth 4 (four) times daily.  40 tablet  1  . metoCLOPramide (REGLAN) 10 MG tablet Take 1 tablet (10 mg total) by mouth 4 (four) times daily. As needed for nausea  30 tablet  2  . ondansetron (ZOFRAN-ODT) 8 MG disintegrating tablet Take 8 mg by mouth every 8 (eight) hours as needed for nausea.       No current facility-administered medications for this visit.    OBJECTIVE: Middle-aged white woman  in no acute distress Filed Vitals:   09/24/12 0910  BP: 104/72  Pulse: 78  Temp: 98.4 F (36.9 C)  Resp: 20     Body mass index is 26.97 kg/(m^2).    ECOG FS: 1 Filed Weights   09/24/12 0910  Weight: 167 lb (75.751 kg)    Sclerae unicteric Oropharynx clear, no ulcerations [name] evidence of thrush, buccal mucosa is moist No cervical [name] supraclavicular adenopathy Lungs clear to auscultation bilaterally, no crackles [name] wheezes Heart regular rate and rhythm, no murmur appreciated Abdomen soft, nontender to palpation, with positive bowel sounds MSK no focal spinal tenderness,  No peripheral edema Neuro: nonfocal, well oriented, positive affect Breasts: Patient status post bilateral mastectomies with expanders  in place. Axillae are benign bilaterally, with no adenopathy palpated. Port is intact in the right upper chest wall, no erythema [name] edema.    LAB RESULTS: Lab Results  Component Value Date   WBC 6.7 09/24/2012   NEUTROABS 4.9 09/24/2012   HGB 9.2* 09/24/2012   HCT 27.5* 09/24/2012   MCV 95.8 09/24/2012   PLT 125* 09/24/2012      Chemistry      Component Value Date/Time   NA 138 09/17/2012 0843   NA 135 06/16/2012 0435   K 3.7 09/17/2012 0843   K 3.2* 06/16/2012 0435   CL 105 08/07/2012 0808   CL 101 06/16/2012 0435   CO2 24 09/17/2012 0843   CO2 27 06/16/2012 0435   BUN 10.1 09/17/2012 0843   BUN 4* 06/16/2012 0435   CREATININE 0.7 09/17/2012 0843   CREATININE 0.66 06/16/2012 0435      Component Value Date/Time   CALCIUM 9.2 09/17/2012 0843   CALCIUM 8.4 06/16/2012 0435   ALKPHOS 80 09/17/2012 0843   ALKPHOS 54 06/07/2012 1053   AST 10 09/17/2012 0843   AST 27 06/07/2012 1053   ALT 13 09/17/2012 0843   ALT 16 06/07/2012 1053   BILITOT 0.60 09/17/2012 0843   BILITOT 0.3 06/07/2012 1053       Lab Results  Component Value Date   LABCA2 23 03/28/2012     STUDIES:  Echocardiogram on 07/23/2012 showed an ejection fraction of 60%.    ASSESSMENT: 47 y.o. BRCA-2 positive [name] woman   (1)  status post right breast biopsy 03/20/2012 for a clinical T1b N0, stage IA invasive ductal carcinoma, grade 1-2, triple negative, with an MIB-1 of 100%  (2) biopsy of a suspicious left breast mass was benign  (3) status post bilateral mastectomies with right sentinel lymph node sampling and immediate expander placement 06/14/2012, for a right-sided pT1c pN0, stage IA invasive ductal carcinoma, grade 1, with ample margins, triple negative, with an MIB-1 of 100%. (The left breast was benign)  (4)  being treated in the adjuvant setting, the plan being to complete 4 dose dense cycles of doxorubicin/ cyclophosphamide followed by 4 dose dense cycles of paclitaxel.  (5)  Patient will not need adjuvant radiation. Once she  is done with active treatment for her breast cancer she will proceed to bilateral salpingo-oophorectomy at her and Dr. Dawayne Patricia discretion.   PLAN:  Xylia will proceed to treatment today as scheduled for her first cycle of dose dense paclitaxel. We again reviewed possible side effects and chemotoxicities, and reviewed currently his antinausea regimen. We also reviewed a bowel regimen to prevent constipation. She was given all of this information in writing today.  Valetta will return tomorrow for her Neulasta injection, and with her past history of nausea and dehydration, she will again receive supportive IV fluids. She has asked that those fluids be given peripherally which should be fine.  I will see Norma [name] again next week on August 19 for assessment of chemotoxicity. She does not call, however, prior to that visit with any changes [name] problems.    Korban Shearer    09/24/2012

## 2012-09-25 ENCOUNTER — Ambulatory Visit: Payer: BC Managed Care – PPO

## 2012-09-25 ENCOUNTER — Ambulatory Visit (HOSPITAL_BASED_OUTPATIENT_CLINIC_OR_DEPARTMENT_OTHER): Payer: BC Managed Care – PPO

## 2012-09-25 VITALS — BP 109/61 | HR 72 | Temp 97.9°F | Resp 20

## 2012-09-25 DIAGNOSIS — Z1509 Genetic susceptibility to other malignant neoplasm: Secondary | ICD-10-CM

## 2012-09-25 DIAGNOSIS — Z5189 Encounter for other specified aftercare: Secondary | ICD-10-CM

## 2012-09-25 DIAGNOSIS — C50411 Malignant neoplasm of upper-outer quadrant of right female breast: Secondary | ICD-10-CM

## 2012-09-25 DIAGNOSIS — C50419 Malignant neoplasm of upper-outer quadrant of unspecified female breast: Secondary | ICD-10-CM

## 2012-09-25 MED ORDER — SODIUM CHLORIDE 0.9 % IJ SOLN
10.0000 mL | INTRAMUSCULAR | Status: DC | PRN
  Administered 2012-09-25: 10 mL via INTRAVENOUS
  Filled 2012-09-25: qty 10

## 2012-09-25 MED ORDER — SODIUM CHLORIDE 0.9 % IV SOLN
Freq: Once | INTRAVENOUS | Status: AC
  Administered 2012-09-25: 09:00:00 via INTRAVENOUS

## 2012-09-25 MED ORDER — PEGFILGRASTIM INJECTION 6 MG/0.6ML
6.0000 mg | Freq: Once | SUBCUTANEOUS | Status: AC
  Administered 2012-09-25: 6 mg via SUBCUTANEOUS
  Filled 2012-09-25: qty 0.6

## 2012-09-25 MED ORDER — LORAZEPAM 2 MG/ML IJ SOLN: 0.5000 mg | Freq: Once | INTRAMUSCULAR | Status: DC

## 2012-09-25 MED ORDER — HEPARIN SOD (PORK) LOCK FLUSH 100 UNIT/ML IV SOLN
500.0000 [IU] | Freq: Once | INTRAVENOUS | Status: AC
  Administered 2012-09-25: 500 [IU] via INTRAVENOUS
  Filled 2012-09-25: qty 5

## 2012-09-25 NOTE — Patient Instructions (Addendum)
Dehydration, Adult Dehydration is when you lose more fluids from the body than you take in. Vital organs like the kidneys, brain, and heart cannot function without a proper amount of fluids and salt. Any loss of fluids from the body can cause dehydration.  CAUSES   Vomiting.  Diarrhea.  Excessive sweating.  Excessive urine output.  Fever. SYMPTOMS  Mild dehydration  Thirst.  Dry lips.  Slightly dry mouth. Moderate dehydration  Very dry mouth.  Sunken eyes.  Skin does not bounce back quickly when lightly pinched and released.  Dark urine and decreased urine production.  Decreased tear production.  Headache. Severe dehydration  Very dry mouth.  Extreme thirst.  Rapid, weak pulse (more than 100 beats per minute at rest).  Cold hands and feet.  Not able to sweat in spite of heat and temperature.  Rapid breathing.  Blue lips.  Confusion and lethargy.  Difficulty being awakened.  Minimal urine production.  No tears. DIAGNOSIS  Your caregiver will diagnose dehydration based on your symptoms and your exam. Blood and urine tests will help confirm the diagnosis. The diagnostic evaluation should also identify the cause of dehydration. TREATMENT  Treatment of mild or moderate dehydration can often be done at home by increasing the amount of fluids that you drink. It is best to drink small amounts of fluid more often. Drinking too much at one time can make vomiting worse. Refer to the home care instructions below. Severe dehydration needs to be treated at the hospital where you will probably be given intravenous (IV) fluids that contain water and electrolytes. HOME CARE INSTRUCTIONS   Ask your caregiver about specific rehydration instructions.  Drink enough fluids to keep your urine clear or pale yellow.  Drink small amounts frequently if you have nausea and vomiting.  Eat as you normally do.  Avoid:  Foods or drinks high in sugar.  Carbonated  drinks.  Juice.  Extremely hot or cold fluids.  Drinks with caffeine.  Fatty, greasy foods.  Alcohol.  Tobacco.  Overeating.  Gelatin desserts.  Wash your hands well to avoid spreading bacteria and viruses.  Only take over-the-counter or prescription medicines for pain, discomfort, or fever as directed by your caregiver.  Ask your caregiver if you should continue all prescribed and over-the-counter medicines.  Keep all follow-up appointments with your caregiver. SEEK MEDICAL CARE IF:  You have abdominal pain and it increases or stays in one area (localizes).  You have a rash, stiff neck, or severe headache.  You are irritable, sleepy, or difficult to awaken.  You are weak, dizzy, or extremely thirsty. SEEK IMMEDIATE MEDICAL CARE IF:   You are unable to keep fluids down or you get worse despite treatment.  You have frequent episodes of vomiting or diarrhea.  You have blood or green matter (bile) in your vomit.  You have blood in your stool or your stool looks black and tarry.  You have not urinated in 6 to 8 hours, or you have only urinated a small amount of very dark urine.  You have a fever.  You faint. MAKE SURE YOU:   Understand these instructions.  Will watch your condition.  Will get help right away if you are not doing well or get worse. Document Released: 01/31/2005 Document Revised: 04/25/2011 Document Reviewed: 09/20/2010 Exit[employer] Patient Information 2014 [employer].  

## 2012-10-02 ENCOUNTER — Ambulatory Visit (HOSPITAL_BASED_OUTPATIENT_CLINIC_OR_DEPARTMENT_OTHER): Payer: BC Managed Care – PPO | Admitting: Physician Assistant

## 2012-10-02 ENCOUNTER — Other Ambulatory Visit (HOSPITAL_BASED_OUTPATIENT_CLINIC_OR_DEPARTMENT_OTHER): Payer: BC Managed Care – PPO | Admitting: Lab

## 2012-10-02 ENCOUNTER — Encounter: Payer: Self-pay | Admitting: Physician Assistant

## 2012-10-02 VITALS — BP 107/71 | HR 81 | Temp 98.6°F | Resp 20 | Ht 66.0 in | Wt 170.6 lb

## 2012-10-02 DIAGNOSIS — F329 Major depressive disorder, single episode, unspecified: Secondary | ICD-10-CM

## 2012-10-02 DIAGNOSIS — C50919 Malignant neoplasm of unspecified site of unspecified female breast: Secondary | ICD-10-CM

## 2012-10-02 DIAGNOSIS — C50419 Malignant neoplasm of upper-outer quadrant of unspecified female breast: Secondary | ICD-10-CM

## 2012-10-02 DIAGNOSIS — Z1501 Genetic susceptibility to malignant neoplasm of breast: Secondary | ICD-10-CM

## 2012-10-02 DIAGNOSIS — F419 Anxiety disorder, unspecified: Secondary | ICD-10-CM | POA: Insufficient documentation

## 2012-10-02 DIAGNOSIS — F32A Depression, unspecified: Secondary | ICD-10-CM | POA: Insufficient documentation

## 2012-10-02 DIAGNOSIS — C50411 Malignant neoplasm of upper-outer quadrant of right female breast: Secondary | ICD-10-CM

## 2012-10-02 DIAGNOSIS — Z171 Estrogen receptor negative status [ER-]: Secondary | ICD-10-CM

## 2012-10-02 DIAGNOSIS — D649 Anemia, unspecified: Secondary | ICD-10-CM

## 2012-10-02 DIAGNOSIS — F411 Generalized anxiety disorder: Secondary | ICD-10-CM

## 2012-10-02 LAB — COMPREHENSIVE METABOLIC PANEL (CC13)
Albumin: 3.7 g/dL (ref 3.5–5.0)
BUN: 8 mg/dL (ref 7.0–26.0)
CO2: 22 mEq/L (ref 22–29)
Calcium: 9.3 mg/dL (ref 8.4–10.4)
Chloride: 111 mEq/L — ABNORMAL HIGH (ref 98–109)
Glucose: 95 mg/dl (ref 70–140)
Potassium: 4.3 mEq/L (ref 3.5–5.1)

## 2012-10-02 LAB — CBC WITH DIFFERENTIAL/PLATELET
Basophils Absolute: 0.2 10*3/uL — ABNORMAL HIGH (ref 0.0–0.1)
Eosinophils Absolute: 0 10*3/uL (ref 0.0–0.5)
HCT: 26.4 % — ABNORMAL LOW (ref 34.8–46.6)
HGB: 9 g/dL — ABNORMAL LOW (ref 11.6–15.9)
MCH: 33.2 pg (ref 25.1–34.0)
MONO#: 2.3 10*3/uL — ABNORMAL HIGH (ref 0.1–0.9)
NEUT%: 91.2 % — ABNORMAL HIGH (ref 38.4–76.8)
lymph#: 1.3 10*3/uL (ref 0.9–3.3)

## 2012-10-02 MED ORDER — LORAZEPAM 0.5 MG PO TABS: 0.2500 mg | ORAL_TABLET | Freq: Two times a day (BID) | ORAL | Status: DC | PRN

## 2012-10-02 MED ORDER — ESCITALOPRAM OXALATE 20 MG PO TABS: 20.0000 mg | ORAL_TABLET | Freq: Every morning | ORAL | Status: DC

## 2012-10-02 NOTE — Progress Notes (Signed)
ID: Norma [name]   DOB: April 19, 1965  MR#: [id number]  WUJ#:[id number]  PCP: Selena Batten GYN: Gerald Leitz SU: Cicero Duck OTHER MD: Lurline Hare, Rogelia Mire, [place], Etter Sjogren   HISTORY OF PRESENT ILLNESS: Norma [name] had routine screening mammography at [facility] 11/16/2010 suggesting additional imaging on the right. This was performed the next day, and found no significant residual abnormality. Repeat screening mammography 03/15/2012 again suggested an area of architectural distortion in the right breast. In the same quadrant as before. Additional views 03/19/2012 found a 3 mm hypoechoic mass in the area in question. Biopsy of this mass 03/20/2012 showed an invasive ductal carcinoma, grade 1-2, triple negative, with an MIB-1 of 100%.  Breast MRI 03/23/2012 showed an area of poorly defined enhancement in the right breast associated with clip artifact. This measured 1 cm. In the left breast there was an enhancing mass measuring 2.0 cm. There were no other suspicious findings. The patient's subsequent history is as detailed below.  INTERVAL HISTORY: Norma [name] returns alone today for followup of her right breast cancer.  She is currently day 9 cycle 1 of 4 planned dose dense cycles of paclitaxel being given in the adjuvant setting. She receives supportive IV fluids and Neulasta on day 2 of each cycle.  Physically, Norma [name] feels well today. She feels like she tolerated the paclitaxel better than the doxorubicin/cyclophosphamide. Her energy level has still been low, but better than it was before. She had less nausea, and no emesis. She has had a few oral ulcerations for which she is using Magic Mouthwash. Soon after her last dose of doxorubicin/cyclophosphamide, she also began to note some blisters on the soles of her feet. These are beginning to heal, and she's had no additional skin changes.  Emotionally, [name] admits to being both depressed and anxious. She denies any suicidal ideation. She is under  quite a bit of stress at home. They're also getting ready to take her son to [place] for college next week. She feels tearful "at the drop of a hat". She's currently on Wellbutrin at 300 mg daily in addition to Lexapro, 10 mg daily.  REVIEW OF SYSTEMS: Norma [name] denies any fevers or chills. She denies abnormal bruising or bleeding.  Her appetite has improved. She's keeping herself well hydrated. She's having regular bowel movements, and less problems with constipation. She's had no new cough, increased shortness of breath, or chest pain. She currently denies any abnormal headaches and has had no dizziness or change in vision.  She also denies any unusual myalgias, arthralgias, bony pain, or peripheral swelling, and has noticed no signs of numbness or tingling in the upper or lower extremities since treatment last week.  A detailed review of systems is otherwise noncontributory.   PAST MEDICAL HISTORY: Past Medical History  Diagnosis Date  . Breast cancer   . PONV (postoperative nausea and vomiting)   . Depression     PAST SURGICAL HISTORY: Past Surgical History  Procedure Laterality Date  . Anterior cruciate ligament repair  2010  . Endometrial ablation      2012  . Dilation and curettage of uterus    . Basal cell carcinoma excision      OFF NOSE  2011   . Simple mastectomy w/ sentinel node biopsy Bilateral 06/15/2012    Dr Jamey Ripa  . Simple mastectomy with axillary sentinel node biopsy Right 06/14/2012    Procedure: SIMPLE MASTECTOMY WITH AXILLARY SENTINEL NODE BIOPSY;  Surgeon: Currie Paris, MD;  Location: [facility] OR;  Service:  General;  Laterality: Right;  . Total mastectomy Left 06/14/2012    Procedure: TOTAL MASTECTOMY;  Surgeon: Currie Paris, MD;  Location: [facility] OR;  Service: General;  Laterality: Left;  . Breast reconstruction with placement of tissue expander and flex hd (acellular hydrated dermis) Bilateral 06/14/2012    Procedure: BILATERAL BREAST RECONSTRUCTION WITH PLACEMENT OF  BILATERAL TISSUE EXPANDERS;  Surgeon: Etter Sjogren, MD;  Location: [facility] OR;  Service: Plastics;  Laterality: Bilateral;  Moh's surgery for basal cell, Left nares  FAMILY HISTORY Family History  Problem Relation Age of Onset  . Breast cancer Maternal Aunt     dx in her 53s  . Ovarian cancer Maternal Aunt     dx in her 24s  . Esophageal cancer Maternal Uncle   . Liver cancer Maternal Uncle   . Lung cancer Maternal Uncle   . BRCA 1/2 Maternal Uncle     BRCA 2 +  . Breast cancer Cousin     dx in her 30s; BRCA2+  . Pancreatic cancer Maternal Uncle     dx in his 3s  . BRCA 1/2 Maternal Uncle     BRCA2+  . Colon cancer Maternal Uncle    the patient's parents are alive, in their early 26s. The patient has one brother and one sister. There is a significant family history of cancer and this includes one of the patient's mother is 2 sisters with ovarian and breast cancer. A first cousin of the patient had breast cancer in her 30s. She has been tested for the BRCA gene, but the patient does not know the results. There were also brothers of the patient's mother with esophageal pancreas and colon cancer. The patient has been scheduled for genetic testing.  GYNECOLOGIC HISTORY: Menarche age 29, first live birth age 16, she is GX P2. The patient's periods are increase in bili scans and irregular, but still ongoing. She did take birth control pills for approximately 10 years with no untoward events.  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 Her husband [name] is a Academic librarian. He owns his own business. He is originally from [place]. Their children are Norma [name] 17 and Norma [name] 14.     ADVANCED DIRECTIVES: Not in place. If both she and her husband become disabled and cannot make her own health care decisions, the patient intends to name her friend Kalenna Millett Scutari as healthcare power of attorney. Otherwise of course she and [name] will be each other self  care power of attorney  HEALTH MAINTENANCE: History  Substance Use Topics  . Smoking status: Never Smoker   . Smokeless tobacco: Never Used  . Alcohol Use: No     Colonoscopy:  PAP:  Bone density:  Lipid panel:  Allergies  Allergen Reactions  . Adhesive [Tape] Rash    Rash  . Penicillins Nausea Only and Rash    Current Outpatient Prescriptions  Medication Sig Dispense Refill  . Alum & Mag Hydroxide-Simeth (MAGIC MOUTHWASH W/LIDOCAINE) SOLN Take 5 mLs by mouth 4 (four) times daily as needed (mouth pain).  240 mL  2  . buPROPion (WELLBUTRIN XL) 300 MG 24 hr tablet Take 300 mg by mouth every morning.       . cephALEXin (KEFLEX) 500 MG capsule Take 1 capsule (500 mg total) by mouth 2 (two) times daily.  14 capsule  3  . dexamethasone (DECADRON) 4 MG tablet TAKE 2 TABLETS BY MOUTH TWICE DAILY WITH FOOD FOR 3  DAYS AFTER EACH CHEMOTHERAPY SESSION  30 tablet  0  . docusate sodium (COLACE) 100 MG capsule Take 100 mg by mouth as needed for constipation.      Marland Kitchen escitalopram (LEXAPRO) 20 MG tablet Take 1 tablet (20 mg total) by mouth every morning. Takes 1/2 tablet  30 tablet  2  . lidocaine-prilocaine (EMLA) cream Apply to port 1-2 hr before each procedure  30 g  2  . LORazepam (ATIVAN) 0.5 MG tablet Take 0.5-1 tablets (0.25-0.5 mg total) by mouth 2 (two) times daily as needed (anxiety, insomnia, or nausea).  30 tablet  0  . methocarbamol (ROBAXIN) 500 MG tablet Take 1 tablet (500 mg total) by mouth 4 (four) times daily.  40 tablet  1  . metoCLOPramide (REGLAN) 10 MG tablet Take 1 tablet (10 mg total) by mouth 4 (four) times daily. As needed for nausea  30 tablet  2  . ondansetron (ZOFRAN-ODT) 8 MG disintegrating tablet Take 8 mg by mouth every 8 (eight) hours as needed for nausea.       No current facility-administered medications for this visit.    OBJECTIVE: Middle-aged white woman who appears tearful but is  in no acute distress Filed Vitals:   10/02/12 1109  BP: 107/71  Pulse: 81   Temp: 98.6 F (37 C)  Resp: 20     Body mass index is 27.55 kg/(m^2).    ECOG FS: 1 Filed Weights   10/02/12 1109  Weight: 170 lb 9.6 oz (77.384 kg)    Sclerae unicteric Oropharynx shows 2 small ulcerations in the left buccal mucosa. These appear to be healing. There is no evidence of thrush. No cervical or supraclavicular adenopathy Lungs clear to auscultation bilaterally, no crackles or wheezes Heart regular rate and rhythm, no murmur appreciated Abdomen soft, nontender to palpation, with positive bowel sounds MSK no focal spinal tenderness,  No peripheral edema Neuro: nonfocal, well oriented, anxious affect Breasts: Patient status post bilateral mastectomies with expanders in place. Axillae are benign bilaterally, with no adenopathy palpated. Port is intact in the right upper chest wall, no erythema or edema.    LAB RESULTS: Lab Results  Component Value Date   WBC 43.8* 10/02/2012   NEUTROABS 39.9* 10/02/2012   HGB 9.0* 10/02/2012   HCT 26.4* 10/02/2012   MCV 97.1 10/02/2012   PLT 308 10/02/2012      Chemistry      Component Value Date/Time   NA 143 10/02/2012 1015   NA 135 06/16/2012 0435   K 4.3 10/02/2012 1015   K 3.2* 06/16/2012 0435   CL 105 08/07/2012 0808   CL 101 06/16/2012 0435   CO2 22 10/02/2012 1015   CO2 27 06/16/2012 0435   BUN 8.0 10/02/2012 1015   BUN 4* 06/16/2012 0435   CREATININE 0.8 10/02/2012 1015   CREATININE 0.66 06/16/2012 0435      Component Value Date/Time   CALCIUM 9.3 10/02/2012 1015   CALCIUM 8.4 06/16/2012 0435   ALKPHOS 164* 10/02/2012 1015   ALKPHOS 54 06/07/2012 1053   AST 48* 10/02/2012 1015   AST 27 06/07/2012 1053   ALT 92* 10/02/2012 1015   ALT 16 06/07/2012 1053   BILITOT 0.29 10/02/2012 1015   BILITOT 0.3 06/07/2012 1053       Lab Results  Component Value Date   LABCA2 23 03/28/2012     STUDIES:  Echocardiogram on 07/23/2012 showed an ejection fraction of 60%.    ASSESSMENT: 47 y.o. BRCA-2 positive Pura Spice woman   (  1)  status  post right breast biopsy 03/20/2012 for a clinical T1b N0, stage IA invasive ductal carcinoma, grade 1-2, triple negative, with an MIB-1 of 100%  (2) biopsy of a suspicious left breast mass was benign  (3) status post bilateral mastectomies with right sentinel lymph node sampling and immediate expander placement 06/14/2012, for a right-sided pT1c pN0, stage IA invasive ductal carcinoma, grade 1, with ample margins, triple negative, with an MIB-1 of 100%. (The left breast was benign)  (4)  being treated in the adjuvant setting, the plan being to complete 4 dose dense cycles of doxorubicin/ cyclophosphamide followed by 4 dose dense cycles of paclitaxel.  (5) Patient will not need adjuvant radiation. Once she is done with active treatment for her breast cancer she will proceed to bilateral salpingo-oophorectomy at her and Dr. Dawayne Patricia discretion.   PLAN:  Fidelia tolerated this first cycle of paclitaxel quite well, and return next week for labs and physical exam on August 25 in anticipation of cycle 2. She feels like she was able to hydrate herself adequately, and is not once receive supportive IV fluids on day 2 of cycle 2. She will, however, receive her Neulasta as usual.  Tresa Endo and I spent quite a bit of time today discussing her depression and anxiety. She will continue on her Wellbutrin at 300 mg daily. I'm going to increase her Lexapro from 10 mg to 20 mg daily, and she also has lorazepam to take, half to one tablet up to twice daily as needed for anxiety. She's also very open to the possibility of counseling, and I have placed a referral through our chaplain, Lenell Antu per her request.  Curtistine voices understanding of the above, and will call if she has any changes or questions prior to her next scheduled appointment on the 25th.    Moselle Rister    10/02/2012

## 2012-10-08 ENCOUNTER — Ambulatory Visit (HOSPITAL_BASED_OUTPATIENT_CLINIC_OR_DEPARTMENT_OTHER): Payer: BC Managed Care – PPO

## 2012-10-08 ENCOUNTER — Ambulatory Visit (HOSPITAL_BASED_OUTPATIENT_CLINIC_OR_DEPARTMENT_OTHER): Payer: BC Managed Care – PPO | Admitting: Physician Assistant

## 2012-10-08 ENCOUNTER — Encounter: Payer: Self-pay | Admitting: Physician Assistant

## 2012-10-08 ENCOUNTER — Other Ambulatory Visit (HOSPITAL_BASED_OUTPATIENT_CLINIC_OR_DEPARTMENT_OTHER): Payer: BC Managed Care – PPO | Admitting: Lab

## 2012-10-08 VITALS — BP 111/72 | HR 106 | Temp 98.8°F | Resp 20 | Ht 66.0 in | Wt 169.1 lb

## 2012-10-08 DIAGNOSIS — C50419 Malignant neoplasm of upper-outer quadrant of unspecified female breast: Secondary | ICD-10-CM

## 2012-10-08 DIAGNOSIS — Z1501 Genetic susceptibility to malignant neoplasm of breast: Secondary | ICD-10-CM

## 2012-10-08 DIAGNOSIS — D649 Anemia, unspecified: Secondary | ICD-10-CM

## 2012-10-08 DIAGNOSIS — Z171 Estrogen receptor negative status [ER-]: Secondary | ICD-10-CM

## 2012-10-08 DIAGNOSIS — C50919 Malignant neoplasm of unspecified site of unspecified female breast: Secondary | ICD-10-CM

## 2012-10-08 DIAGNOSIS — D696 Thrombocytopenia, unspecified: Secondary | ICD-10-CM

## 2012-10-08 DIAGNOSIS — Z5111 Encounter for antineoplastic chemotherapy: Secondary | ICD-10-CM

## 2012-10-08 DIAGNOSIS — F419 Anxiety disorder, unspecified: Secondary | ICD-10-CM

## 2012-10-08 DIAGNOSIS — C50411 Malignant neoplasm of upper-outer quadrant of right female breast: Secondary | ICD-10-CM

## 2012-10-08 LAB — CBC WITH DIFFERENTIAL/PLATELET
Basophils Absolute: 0 10*3/uL (ref 0.0–0.1)
EOS%: 0.1 % (ref 0.0–7.0)
Eosinophils Absolute: 0 10*3/uL (ref 0.0–0.5)
HGB: 8.6 g/dL — ABNORMAL LOW (ref 11.6–15.9)
NEUT#: 6.4 10*3/uL (ref 1.5–6.5)
RBC: 2.63 10*6/uL — ABNORMAL LOW (ref 3.70–5.45)
RDW: 18.3 % — ABNORMAL HIGH (ref 11.2–14.5)
lymph#: 1.1 10*3/uL (ref 0.9–3.3)
nRBC: 1 % — ABNORMAL HIGH (ref 0–0)

## 2012-10-08 MED ORDER — SODIUM CHLORIDE 0.9 % IJ SOLN
10.0000 mL | INTRAMUSCULAR | Status: DC | PRN
  Filled 2012-10-08: qty 10

## 2012-10-08 MED ORDER — DEXAMETHASONE 4 MG PO TABS: 8.0000 mg | ORAL_TABLET | Freq: Two times a day (BID) | ORAL | Status: DC

## 2012-10-08 MED ORDER — SODIUM CHLORIDE 0.9 % IV SOLN
INTRAVENOUS | Status: DC
  Administered 2012-10-08: 16:00:00 via INTRAVENOUS

## 2012-10-08 MED ORDER — DEXAMETHASONE SODIUM PHOSPHATE 20 MG/5ML IJ SOLN
20.0000 mg | Freq: Once | INTRAMUSCULAR | Status: AC
  Administered 2012-10-08: 20 mg via INTRAVENOUS

## 2012-10-08 MED ORDER — ONDANSETRON 8 MG/50ML IVPB (CHCC)
8.0000 mg | Freq: Once | INTRAVENOUS | Status: AC
  Administered 2012-10-08: 8 mg via INTRAVENOUS

## 2012-10-08 MED ORDER — DIPHENHYDRAMINE HCL 50 MG/ML IJ SOLN
25.0000 mg | Freq: Once | INTRAMUSCULAR | Status: AC
  Administered 2012-10-08: 25 mg via INTRAVENOUS

## 2012-10-08 MED ORDER — SODIUM CHLORIDE 0.9 % IV SOLN: 1000.0000 mL | INTRAVENOUS | Status: DC

## 2012-10-08 MED ORDER — HEPARIN SOD (PORK) LOCK FLUSH 100 UNIT/ML IV SOLN
500.0000 [IU] | Freq: Once | INTRAVENOUS | Status: DC | PRN
  Filled 2012-10-08: qty 5

## 2012-10-08 MED ORDER — PACLITAXEL CHEMO INJECTION 300 MG/50ML
175.0000 mg/m2 | Freq: Once | INTRAVENOUS | Status: AC
  Administered 2012-10-08: 324 mg via INTRAVENOUS
  Filled 2012-10-08: qty 54

## 2012-10-08 MED ORDER — FAMOTIDINE IN NACL 20-0.9 MG/50ML-% IV SOLN
20.0000 mg | Freq: Once | INTRAVENOUS | Status: AC
  Administered 2012-10-08: 20 mg via INTRAVENOUS

## 2012-10-08 MED ORDER — LORAZEPAM 2 MG/ML IJ SOLN
0.5000 mg | Freq: Once | INTRAMUSCULAR | Status: AC
  Administered 2012-10-08: 0.5 mg via INTRAVENOUS

## 2012-10-08 NOTE — Progress Notes (Signed)
ID: Norma [name]   DOB: [birth year]  MR#: [id number]  WUJ#:[id number]  PCP: Selena Batten GYN: Gerald Leitz SU: Cicero Duck OTHER MD: Lurline Hare, Rogelia Mire, [place], Etter Sjogren   HISTORY OF PRESENT ILLNESS: Norma [name] had routine screening mammography at [facility] 11/16/2010 suggesting additional imaging on the right. This was performed the next day, and found no significant residual abnormality. Repeat screening mammography 03/15/2012 again suggested an area of architectural distortion in the right breast. In the same quadrant as before. Additional views 03/19/2012 found a 3 mm hypoechoic mass in the area in question. Biopsy of this mass 03/20/2012 showed an invasive ductal carcinoma, grade 1-2, triple negative, with an MIB-1 of 100%.  Breast MRI 03/23/2012 showed an area of poorly defined enhancement in the right breast associated with clip artifact. This measured 1 cm. In the left breast there was an enhancing mass measuring 2.0 cm. There were no other suspicious findings. The patient's subsequent history is as detailed below.  INTERVAL HISTORY: Norma [name] returns today accompanied by her sister Norma [name] for followup of her right breast cancer.  She is due for day 1 cycle 2 of 4 planned dose dense cycles of paclitaxel being given in the adjuvant setting. She Neulasta on day 2 of each cycle.  Interval history is unremarkable. Norma [name] is feeling better, both physically and emotionally today. She has increased her Lexapro to 20 mg as previously discussed and tells me it has "been a better week". They are getting ready to leave this week to take her son to college in [place]. So far, she seems to be dealing with this very appropriately.   REVIEW OF SYSTEMS: Norma [name] denies any fevers or chills. She denies abnormal bruising or bleeding.  Her appetite has decreased due to taste aversion. Fortunately, she's had no nausea or emesis. She tends to be mildly constipated but is having regular bowel movements. She's  had no new cough, phlegm production, or chest pain. She is having increased shortness of breath with exertion. Her energy level is decreased slightly. She currently denies any abnormal headaches and has had no dizziness or change in vision.  She denies any unusual myalgias, arthralgias, bony pain, or peripheral swelling, and has noticed no signs of increased numbness or tingling in the upper or lower extremities.  A detailed review of systems is otherwise noncontributory.   PAST MEDICAL HISTORY: Past Medical History  Diagnosis Date  . Breast cancer   . PONV (postoperative nausea and vomiting)   . Depression     PAST SURGICAL HISTORY: Past Surgical History  Procedure Laterality Date  . Anterior cruciate ligament repair  2010  . Endometrial ablation      2012  . Dilation and curettage of uterus    . Basal cell carcinoma excision      OFF NOSE  2011   . Simple mastectomy w/ sentinel node biopsy Bilateral 06/15/2012    Dr Jamey Ripa  . Simple mastectomy with axillary sentinel node biopsy Right 06/14/2012    Procedure: SIMPLE MASTECTOMY WITH AXILLARY SENTINEL NODE BIOPSY;  Surgeon: Currie Paris, MD;  Location: [facility] OR;  Service: General;  Laterality: Right;  . Total mastectomy Left 06/14/2012    Procedure: TOTAL MASTECTOMY;  Surgeon: Currie Paris, MD;  Location: [facility] OR;  Service: General;  Laterality: Left;  . Breast reconstruction with placement of tissue expander and flex hd (acellular hydrated dermis) Bilateral 06/14/2012    Procedure: BILATERAL BREAST RECONSTRUCTION WITH PLACEMENT OF BILATERAL TISSUE EXPANDERS;  Surgeon: Etter Sjogren, MD;  Location: [facility] OR;  Service: Plastics;  Laterality: Bilateral;  Moh's surgery for basal cell, Left nares  FAMILY HISTORY Family History  Problem Relation Age of Onset  . Breast cancer Maternal Aunt     dx in her 51s  . Ovarian cancer Maternal Aunt     dx in her 42s  . Esophageal cancer Maternal Uncle   . Liver cancer Maternal Uncle   . Lung  cancer Maternal Uncle   . BRCA 1/2 Maternal Uncle     BRCA 2 +  . Breast cancer Cousin     dx in her 30s; BRCA2+  . Pancreatic cancer Maternal Uncle     dx in his 90s  . BRCA 1/2 Maternal Uncle     BRCA2+  . Colon cancer Maternal Uncle    the patient's parents are alive, in their early 71s. The patient has one brother and one sister. There is a significant family history of cancer and this includes one of the patient's mother is 2 sisters with ovarian and breast cancer. A first cousin of the patient had breast cancer in her 30s. She has been tested for the BRCA gene, but the patient does not know the results. There were also brothers of the patient's mother with esophageal pancreas and colon cancer. The patient has been scheduled for genetic testing.  GYNECOLOGIC HISTORY: Menarche age 78, first live birth age 13, she is GX P2. The patient's periods are increase in bili scans and irregular, but still ongoing. She did take birth control pills for approximately 10 years with no untoward events.  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 Her husband Norma [name] is a Academic librarian. He owns his own business. He is originally from [place]. Their children are Norma [name] 17 and Norma [name] 14.     ADVANCED DIRECTIVES: Not in place. If both she and her husband become disabled and cannot make her own health care decisions, the patient intends to name her friend Norma [name] as healthcare power of attorney. Otherwise of course she and Norma [name] will be each other self care power of attorney  HEALTH MAINTENANCE: History  Substance Use Topics  . Smoking status: Never Smoker   . Smokeless tobacco: Never Used  . Alcohol Use: No     Colonoscopy:  PAP:  Bone density:  Lipid panel:  Allergies  Allergen Reactions  . Adhesive [Tape] Rash    Rash  . Penicillins Nausea Only and Rash    Current Outpatient Prescriptions  Medication Sig Dispense Refill  . Alum  & Mag Hydroxide-Simeth (MAGIC MOUTHWASH W/LIDOCAINE) SOLN Take 5 mLs by mouth 4 (four) times daily as needed (mouth pain).  240 mL  2  . buPROPion (WELLBUTRIN XL) 300 MG 24 hr tablet Take 300 mg by mouth every morning.       . cephALEXin (KEFLEX) 500 MG capsule Take 1 capsule (500 mg total) by mouth 2 (two) times daily.  14 capsule  3  . dexamethasone (DECADRON) 4 MG tablet Take 2 tablets (8 mg total) by mouth 2 (two) times daily with a meal. As directed with each chemo  30 tablet  0  . docusate sodium (COLACE) 100 MG capsule Take 100 mg by mouth as needed for constipation.      Marland Kitchen escitalopram (LEXAPRO) 20 MG tablet Take 1 tablet (20 mg total) by mouth every morning. Takes 1/2 tablet  30 tablet  2  . lidocaine-prilocaine (EMLA) cream Apply to port  1-2 hr before each procedure  30 g  2  . LORazepam (ATIVAN) 0.5 MG tablet Take 0.5-1 tablets (0.25-0.5 mg total) by mouth 2 (two) times daily as needed (anxiety, insomnia, or nausea).  30 tablet  0  . methocarbamol (ROBAXIN) 500 MG tablet Take 1 tablet (500 mg total) by mouth 4 (four) times daily.  40 tablet  1  . metoCLOPramide (REGLAN) 10 MG tablet Take 1 tablet (10 mg total) by mouth 4 (four) times daily. As needed for nausea  30 tablet  2  . ondansetron (ZOFRAN-ODT) 8 MG disintegrating tablet Take 8 mg by mouth every 8 (eight) hours as needed for nausea.       No current facility-administered medications for this visit.    OBJECTIVE: Middle-aged white woman who appears tearful but is  in no acute distress Filed Vitals:   10/08/12 1340  BP: 111/72  Pulse: 106  Temp: 98.8 F (37.1 C)  Resp: 20     Body mass index is 27.31 kg/(m^2).    ECOG FS: 1 Filed Weights   10/08/12 1340  Weight: 169 lb 1.6 oz (76.703 kg)    Sclerae unicteric Oropharynx is clear with no evidence of ulcerations or thrush No cervical or supraclavicular adenopathy Lungs clear to auscultation bilaterally, no crackles or wheezes Heart regular rate and rhythm, no murmur  appreciated Abdomen soft, nontender, positive bowel sounds MSK no focal spinal tenderness to palpation,  No peripheral edema Neuro: nonfocal, well oriented, anxious affect Breasts: Deferred. Axillae are benign bilaterally, with no adenopathy palpated. Port is intact in the right upper chest wall, no erythema or edema.    LAB RESULTS: Lab Results  Component Value Date   WBC 8.3 10/08/2012   NEUTROABS 6.4 10/08/2012   HGB 8.6* 10/08/2012   HCT 25.8* 10/08/2012   MCV 98.1 10/08/2012   PLT 139* 10/08/2012      Chemistry      Component Value Date/Time   NA 143 10/02/2012 1015   NA 135 06/16/2012 0435   K 4.3 10/02/2012 1015   K 3.2* 06/16/2012 0435   CL 105 08/07/2012 0808   CL 101 06/16/2012 0435   CO2 22 10/02/2012 1015   CO2 27 06/16/2012 0435   BUN 8.0 10/02/2012 1015   BUN 4* 06/16/2012 0435   CREATININE 0.8 10/02/2012 1015   CREATININE 0.66 06/16/2012 0435      Component Value Date/Time   CALCIUM 9.3 10/02/2012 1015   CALCIUM 8.4 06/16/2012 0435   ALKPHOS 164* 10/02/2012 1015   ALKPHOS 54 06/07/2012 1053   AST 48* 10/02/2012 1015   AST 27 06/07/2012 1053   ALT 92* 10/02/2012 1015   ALT 16 06/07/2012 1053   BILITOT 0.29 10/02/2012 1015   BILITOT 0.3 06/07/2012 1053       Lab Results  Component Value Date   LABCA2 23 03/28/2012     STUDIES:  Echocardiogram on 07/23/2012 showed an ejection fraction of 60%.    ASSESSMENT: 47 y.o. BRCA-2 positive Norma [name] woman   (1)  status post right breast biopsy 03/20/2012 for a clinical T1b N0, stage IA invasive ductal carcinoma, grade 1-2, triple negative, with an MIB-1 of 100%  (2) biopsy of a suspicious left breast mass was benign  (3) status post bilateral mastectomies with right sentinel lymph node sampling and immediate expander placement 06/14/2012, for a right-sided pT1c pN0, stage IA invasive ductal carcinoma, grade 1, with ample margins, triple negative, with an MIB-1 of 100%. (The left breast was benign)  (  4)  being treated in the  adjuvant setting, the plan being to complete 4 dose dense cycles of doxorubicin/ cyclophosphamide followed by 4 dose dense cycles of paclitaxel.  (5) Patient will not need adjuvant radiation. Once she is done with active treatment for her breast cancer she will proceed to bilateral salpingo-oophorectomy at her and Dr. Dawayne Patricia discretion.   PLAN:  Caterine will proceed to treatment today as scheduled for her second dose of adjuvant paclitaxel. She receive her Neulasta injection tomorrow, but does not feel that she needs any supportive IV fluids. I will see her back again next week on September 2 for assessment of chemotoxicity. She knows to call prior that time with any changes or problems.  Of note, I am refilling her dexamethasone which she takes following each cycle of chemotherapy.    Ronique Simerly    10/08/2012

## 2012-10-08 NOTE — Patient Instructions (Signed)
[facility] Discharge Instructions for Patients Receiving Chemotherapy  Today you received the following chemotherapy agents: taxol  To help prevent nausea and vomiting after your treatment, we encourage you to take your nausea medication.  Take it as often as prescribed.     If you develop nausea and vomiting that is not controlled by your nausea medication, call the clinic. If it is after clinic hours your family physician or the after hours number for the clinic or go to the Emergency Department.   BELOW ARE SYMPTOMS THAT SHOULD BE REPORTED IMMEDIATELY:  *FEVER GREATER THAN 100.5 F  *CHILLS WITH OR WITHOUT FEVER  NAUSEA AND VOMITING THAT IS NOT CONTROLLED WITH YOUR NAUSEA MEDICATION  *UNUSUAL SHORTNESS OF BREATH  *UNUSUAL BRUISING OR BLEEDING  TENDERNESS IN MOUTH AND THROAT WITH OR WITHOUT PRESENCE OF ULCERS  *URINARY PROBLEMS  *BOWEL PROBLEMS  UNUSUAL RASH Items with * indicate a potential emergency and should be followed up as soon as possible.  Feel free to call the clinic you have any questions or concerns. The clinic phone number is [phone].   I have been informed and understand all the instructions given to me. I know to contact the clinic, my physician, or go to the Emergency Department if any problems should occur. I do not have any questions at this time, but understand that I may call the clinic during office hours   should I have any questions or need assistance in obtaining follow up care.    __________________________________________  _____________  __________ Signature of Patient or Authorized Representative            Date                   Time    __________________________________________ Nurse's Signature    

## 2012-10-09 ENCOUNTER — Ambulatory Visit (HOSPITAL_BASED_OUTPATIENT_CLINIC_OR_DEPARTMENT_OTHER): Payer: BC Managed Care – PPO

## 2012-10-09 VITALS — BP 109/68 | HR 71 | Temp 98.2°F

## 2012-10-09 DIAGNOSIS — C50419 Malignant neoplasm of upper-outer quadrant of unspecified female breast: Secondary | ICD-10-CM

## 2012-10-09 DIAGNOSIS — Z5189 Encounter for other specified aftercare: Secondary | ICD-10-CM

## 2012-10-09 DIAGNOSIS — Z1501 Genetic susceptibility to malignant neoplasm of breast: Secondary | ICD-10-CM

## 2012-10-09 MED ORDER — PEGFILGRASTIM INJECTION 6 MG/0.6ML
6.0000 mg | Freq: Once | SUBCUTANEOUS | Status: AC
  Administered 2012-10-09: 6 mg via SUBCUTANEOUS
  Filled 2012-10-09: qty 0.6

## 2012-10-16 ENCOUNTER — Encounter: Payer: Self-pay | Admitting: Physician Assistant

## 2012-10-16 ENCOUNTER — Ambulatory Visit (HOSPITAL_BASED_OUTPATIENT_CLINIC_OR_DEPARTMENT_OTHER): Payer: BC Managed Care – PPO | Admitting: Physician Assistant

## 2012-10-16 ENCOUNTER — Other Ambulatory Visit (HOSPITAL_BASED_OUTPATIENT_CLINIC_OR_DEPARTMENT_OTHER): Payer: BC Managed Care – PPO | Admitting: Lab

## 2012-10-16 VITALS — BP 107/74 | HR 91 | Temp 98.6°F | Resp 18 | Ht 66.0 in | Wt 168.9 lb

## 2012-10-16 DIAGNOSIS — C50919 Malignant neoplasm of unspecified site of unspecified female breast: Secondary | ICD-10-CM

## 2012-10-16 DIAGNOSIS — F411 Generalized anxiety disorder: Secondary | ICD-10-CM

## 2012-10-16 DIAGNOSIS — C50419 Malignant neoplasm of upper-outer quadrant of unspecified female breast: Secondary | ICD-10-CM

## 2012-10-16 DIAGNOSIS — F419 Anxiety disorder, unspecified: Secondary | ICD-10-CM

## 2012-10-16 DIAGNOSIS — Z1501 Genetic susceptibility to malignant neoplasm of breast: Secondary | ICD-10-CM

## 2012-10-16 DIAGNOSIS — C50411 Malignant neoplasm of upper-outer quadrant of right female breast: Secondary | ICD-10-CM

## 2012-10-16 DIAGNOSIS — F329 Major depressive disorder, single episode, unspecified: Secondary | ICD-10-CM

## 2012-10-16 DIAGNOSIS — D649 Anemia, unspecified: Secondary | ICD-10-CM

## 2012-10-16 LAB — COMPREHENSIVE METABOLIC PANEL (CC13)
ALT: 66 U/L — ABNORMAL HIGH (ref 0–55)
Albumin: 4 g/dL (ref 3.5–5.0)
CO2: 22 mEq/L (ref 22–29)
Calcium: 9.4 mg/dL (ref 8.4–10.4)
Chloride: 107 mEq/L (ref 98–109)
Sodium: 140 mEq/L (ref 136–145)
Total Protein: 6.6 g/dL (ref 6.4–8.3)

## 2012-10-16 LAB — CBC WITH DIFFERENTIAL/PLATELET
BASO%: 0.8 % (ref 0.0–2.0)
Eosinophils Absolute: 0.3 10*3/uL (ref 0.0–0.5)
HCT: 27.2 % — ABNORMAL LOW (ref 34.8–46.6)
MCHC: 34.7 g/dL (ref 31.5–36.0)
MONO#: 1.8 10*3/uL — ABNORMAL HIGH (ref 0.1–0.9)
NEUT#: 24 10*3/uL — ABNORMAL HIGH (ref 1.5–6.5)
RBC: 2.73 10*6/uL — ABNORMAL LOW (ref 3.70–5.45)
WBC: 28.7 10*3/uL — ABNORMAL HIGH (ref 3.9–10.3)
lymph#: 2.3 10*3/uL (ref 0.9–3.3)

## 2012-10-16 NOTE — Progress Notes (Signed)
ID: Norma [name]   DOB: [birth year]  MR#: [id number]  NWG#:[id number]  PCP: Norma [name] GYN: Norma [name] SU: Norma [name] OTHER MD: Norma [name], Norma [name], Norma [name], Norma [name]   HISTORY OF PRESENT ILLNESS: Norma [name] had routine screening mammography at [facility] 11/16/2010 suggesting additional imaging on the right. This was performed the next day, and found no significant residual abnormality. Repeat screening mammography 03/15/2012 again suggested an area of architectural distortion in the right breast. In the same quadrant as before. Additional views 03/19/2012 found a 3 mm hypoechoic mass in the area in question. Biopsy of this mass 03/20/2012 showed an invasive ductal carcinoma, grade 1-2, triple negative, with an MIB-1 of 100%.  Breast MRI 03/23/2012 showed an area of poorly defined enhancement in the right breast associated with clip artifact. This measured 1 cm. In the left breast there was an enhancing mass measuring 2.0 cm. There were no other suspicious findings. The patient's subsequent history is as detailed below.  INTERVAL HISTORY: Norma [name] returns alone today  for followup of her right breast cancer.  She is currently day 9 cycle 2 of 4 planned dose dense cycles of paclitaxel being given in the adjuvant setting. She Neulasta on day 2 of each cycle.  Interval history is remarkable for Norma [name] having traveled to [place] this past week to take her son to college. She tells me it was hard physically and emotionally, but he is settled in and happy. She continues to have some mild depression and anxiety. She has increased her Lexapro to 20 mg daily and takes lorazepam periodically which she finds helpful.  REVIEW OF SYSTEMS: Norma [name] denies any fevers or chills. She denies any skin changes and has had no abnormal bruising or bleeding.  She feels tired. Her appetite is decreased due to taste aversion but she denies any nausea, emesis, diarrhea, or constipation. She's had no new cough,  phlegm production, or chest pain. She is having shortness of breath with exertion, and this is stable.  She currently denies any abnormal headaches and has had no dizziness or change in vision.  She has general achiness following the Neulasta injection. She's had no peripheral swelling. She had one brief episode of tingling in her hands, but this resolved and she's had no additional signs of neuropathy.  A detailed review of systems is otherwise noncontributory.   PAST MEDICAL HISTORY: Past Medical History  Diagnosis Date  . Breast cancer   . PONV (postoperative nausea and vomiting)   . Depression     PAST SURGICAL HISTORY: Past Surgical History  Procedure Laterality Date  . Anterior cruciate ligament repair  2010  . Endometrial ablation      2012  . Dilation and curettage of uterus    . Basal cell carcinoma excision      OFF NOSE  2011   . Simple mastectomy w/ sentinel node biopsy Bilateral 06/15/2012    Dr Norma [name]  . Simple mastectomy with axillary sentinel node biopsy Right 06/14/2012    Procedure: SIMPLE MASTECTOMY WITH AXILLARY SENTINEL NODE BIOPSY;  Surgeon: Norma Paris, MD;  Location: [facility] OR;  Service: General;  Laterality: Right;  . Total mastectomy Left 06/14/2012    Procedure: TOTAL MASTECTOMY;  Surgeon: Norma Paris, MD;  Location: [facility] OR;  Service: General;  Laterality: Left;  . Breast reconstruction with placement of tissue expander and flex hd (acellular hydrated dermis) Bilateral 06/14/2012    Procedure: BILATERAL BREAST RECONSTRUCTION WITH PLACEMENT OF BILATERAL TISSUE EXPANDERS;  Surgeon: Norma Sjogren,  MD;  Location: [facility] OR;  Service: Plastics;  Laterality: Bilateral;  Moh's surgery for basal cell, Left nares  FAMILY HISTORY Family History  Problem Relation Age of Onset  . Breast cancer Maternal Aunt     dx in her 28s  . Ovarian cancer Maternal Aunt     dx in her 72s  . Esophageal cancer Maternal Uncle   . Liver cancer Maternal Uncle   . Lung cancer  Maternal Uncle   . BRCA 1/2 Maternal Uncle     BRCA 2 +  . Breast cancer Cousin     dx in her 30s; BRCA2+  . Pancreatic cancer Maternal Uncle     dx in his 73s  . BRCA 1/2 Maternal Uncle     BRCA2+  . Colon cancer Maternal Uncle    the patient's parents are alive, in their early 28s. The patient has one brother and one sister. There is a significant family history of cancer and this includes one of the patient's mother is 2 sisters with ovarian and breast cancer. A first cousin of the patient had breast cancer in her 30s. She has been tested for the BRCA gene, but the patient does not know the results. There were also brothers of the patient's mother with esophageal pancreas and colon cancer. The patient has been scheduled for genetic testing.  GYNECOLOGIC HISTORY: Menarche age 23, first live birth age 40, she is GX P2. The patient's periods are increase in bili scans and irregular, but still ongoing. She did take birth control pills for approximately 10 years with no untoward events.  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 Her husband Norma [name] is a Academic librarian. He owns his own business. He is originally from [place]. Their children are Norma [name] 17 and Norma [name] 14.     ADVANCED DIRECTIVES: Not in place. If both she and her husband become disabled and cannot make her own health care decisions, the patient intends to name her friend Norma [name] as healthcare power of attorney. Otherwise of course she and Norma [name] will be each other self care power of attorney  HEALTH MAINTENANCE: History  Substance Use Topics  . Smoking status: Never Smoker   . Smokeless tobacco: Never Used  . Alcohol Use: No     Colonoscopy:  PAP:  Bone density:  Lipid panel:  Allergies  Allergen Reactions  . Adhesive [Tape] Rash    Rash  . Penicillins Nausea Only and Rash    Current Outpatient Prescriptions  Medication Sig Dispense Refill  . Alum & Mag  Hydroxide-Simeth (MAGIC MOUTHWASH W/LIDOCAINE) SOLN Take 5 mLs by mouth 4 (four) times daily as needed (mouth pain).  240 mL  2  . buPROPion (WELLBUTRIN XL) 300 MG 24 hr tablet Take 300 mg by mouth every morning.       . cephALEXin (KEFLEX) 500 MG capsule Take 1 capsule (500 mg total) by mouth 2 (two) times daily.  14 capsule  3  . dexamethasone (DECADRON) 4 MG tablet Take 2 tablets (8 mg total) by mouth 2 (two) times daily with a meal. As directed with each chemo  30 tablet  0  . docusate sodium (COLACE) 100 MG capsule Take 100 mg by mouth as needed for constipation.      Marland Kitchen escitalopram (LEXAPRO) 20 MG tablet Take 1 tablet (20 mg total) by mouth every morning. Takes 1/2 tablet  30 tablet  2  . lidocaine-prilocaine (EMLA) cream Apply  to port 1-2 hr before each procedure  30 g  2  . LORazepam (ATIVAN) 0.5 MG tablet Take 0.5-1 tablets (0.25-0.5 mg total) by mouth 2 (two) times daily as needed (anxiety, insomnia, or nausea).  30 tablet  0  . methocarbamol (ROBAXIN) 500 MG tablet Take 1 tablet (500 mg total) by mouth 4 (four) times daily.  40 tablet  1  . metoCLOPramide (REGLAN) 10 MG tablet Take 1 tablet (10 mg total) by mouth 4 (four) times daily. As needed for nausea  30 tablet  2  . ondansetron (ZOFRAN-ODT) 8 MG disintegrating tablet Take 8 mg by mouth every 8 (eight) hours as needed for nausea.       No current facility-administered medications for this visit.    OBJECTIVE: Middle-aged white woman who appears somewhat anxious and tired but is  in no acute distress Filed Vitals:   10/16/12 0939  BP: 107/74  Pulse: 91  Temp: 98.6 F (37 C)  Resp: 18     Body mass index is 27.27 kg/(m^2).    ECOG FS: 1 Filed Weights   10/16/12 0939  Weight: 168 lb 14.4 oz (76.613 kg)    Sclerae unicteric Oropharynx is clear with no evidence of ulcerations or thrush No cervical or supraclavicular adenopathy Lungs clear to auscultation bilaterally, no crackles or wheezes Heart regular rate and rhythm, no  murmur appreciated Abdomen soft, nontender to palpation, positive bowel sounds MSK no focal spinal tenderness to palpation,  No peripheral edema Neuro: nonfocal, well oriented, anxious affect Breasts: Status post bilateral mastectomies. Axillae are benign bilaterally, with no adenopathy palpated. Port is intact in the right upper chest wall, no erythema or edema.    LAB RESULTS: Lab Results  Component Value Date   WBC 28.7* 10/16/2012   NEUTROABS 24.0* 10/16/2012   HGB 9.4* 10/16/2012   HCT 27.2* 10/16/2012   MCV 99.7 10/16/2012   PLT 216 10/16/2012      Chemistry      Component Value Date/Time   NA 143 10/02/2012 1015   NA 135 06/16/2012 0435   K 4.3 10/02/2012 1015   K 3.2* 06/16/2012 0435   CL 105 08/07/2012 0808   CL 101 06/16/2012 0435   CO2 22 10/02/2012 1015   CO2 27 06/16/2012 0435   BUN 8.0 10/02/2012 1015   BUN 4* 06/16/2012 0435   CREATININE 0.8 10/02/2012 1015   CREATININE 0.66 06/16/2012 0435      Component Value Date/Time   CALCIUM 9.3 10/02/2012 1015   CALCIUM 8.4 06/16/2012 0435   ALKPHOS 164* 10/02/2012 1015   ALKPHOS 54 06/07/2012 1053   AST 48* 10/02/2012 1015   AST 27 06/07/2012 1053   ALT 92* 10/02/2012 1015   ALT 16 06/07/2012 1053   BILITOT 0.29 10/02/2012 1015   BILITOT 0.3 06/07/2012 1053       Lab Results  Component Value Date   LABCA2 23 03/28/2012     STUDIES:  Echocardiogram on 07/23/2012 showed an ejection fraction of 60%.    ASSESSMENT: 47 y.o. BRCA-2 positive Pura Spice woman   (1)  status post right breast biopsy 03/20/2012 for a clinical T1b N0, stage IA invasive ductal carcinoma, grade 1-2, triple negative, with an MIB-1 of 100%  (2) biopsy of a suspicious left breast mass was benign  (3) status post bilateral mastectomies with right sentinel lymph node sampling and immediate expander placement 06/14/2012, for a right-sided pT1c pN0, stage IA invasive ductal carcinoma, grade 1, with ample margins, triple negative, with  an MIB-1 of 100%. (The left breast  was benign)  (4)  being treated in the adjuvant setting, the plan being to complete 4 dose dense cycles of doxorubicin/ cyclophosphamide followed by 4 dose dense cycles of paclitaxel.  (5) Patient will not need adjuvant radiation. Once she is done with active treatment for her breast cancer she will proceed to bilateral salpingo-oophorectomy at her and Dr. Dawayne Patricia discretion.   PLAN:  Hildagard will return next week on September 8 for day 1 cycle 3 of paclitaxel, followed by Neulasta injection on day 2. Her final dose of adjuvant chemotherapy is been scheduled 2 weeks later on September 22. We'll continue to follow her very closely with labs and physical exam.  Nelma voices understanding and agreement with our plan, and will call with any changes or problems.   Sabre Leonetti    10/16/2012

## 2012-10-22 ENCOUNTER — Other Ambulatory Visit: Payer: Self-pay | Admitting: Certified Registered Nurse Anesthetist

## 2012-10-22 ENCOUNTER — Other Ambulatory Visit (HOSPITAL_BASED_OUTPATIENT_CLINIC_OR_DEPARTMENT_OTHER): Payer: BC Managed Care – PPO | Admitting: Lab

## 2012-10-22 ENCOUNTER — Ambulatory Visit (HOSPITAL_BASED_OUTPATIENT_CLINIC_OR_DEPARTMENT_OTHER): Payer: BC Managed Care – PPO

## 2012-10-22 ENCOUNTER — Ambulatory Visit (HOSPITAL_BASED_OUTPATIENT_CLINIC_OR_DEPARTMENT_OTHER): Payer: BC Managed Care – PPO | Admitting: Physician Assistant

## 2012-10-22 ENCOUNTER — Encounter: Payer: Self-pay | Admitting: Physician Assistant

## 2012-10-22 VITALS — BP 109/73 | HR 91 | Temp 98.2°F | Resp 20 | Ht 66.0 in | Wt 168.5 lb

## 2012-10-22 DIAGNOSIS — C50919 Malignant neoplasm of unspecified site of unspecified female breast: Secondary | ICD-10-CM

## 2012-10-22 DIAGNOSIS — C50419 Malignant neoplasm of upper-outer quadrant of unspecified female breast: Secondary | ICD-10-CM

## 2012-10-22 DIAGNOSIS — D649 Anemia, unspecified: Secondary | ICD-10-CM

## 2012-10-22 DIAGNOSIS — C50411 Malignant neoplasm of upper-outer quadrant of right female breast: Secondary | ICD-10-CM

## 2012-10-22 DIAGNOSIS — F419 Anxiety disorder, unspecified: Secondary | ICD-10-CM

## 2012-10-22 DIAGNOSIS — Z1501 Genetic susceptibility to malignant neoplasm of breast: Secondary | ICD-10-CM

## 2012-10-22 DIAGNOSIS — Z5111 Encounter for antineoplastic chemotherapy: Secondary | ICD-10-CM

## 2012-10-22 DIAGNOSIS — Z171 Estrogen receptor negative status [ER-]: Secondary | ICD-10-CM

## 2012-10-22 LAB — CBC WITH DIFFERENTIAL/PLATELET
BASO%: 0.3 % (ref 0.0–2.0)
LYMPH%: 16.8 % (ref 14.0–49.7)
MCHC: 33.3 g/dL (ref 31.5–36.0)
MONO#: 0.5 10*3/uL (ref 0.1–0.9)
Platelets: 176 10*3/uL (ref 145–400)
RBC: 2.81 10*6/uL — ABNORMAL LOW (ref 3.70–5.45)
RDW: 17.4 % — ABNORMAL HIGH (ref 11.2–14.5)
WBC: 6 10*3/uL (ref 3.9–10.3)
lymph#: 1 10*3/uL (ref 0.9–3.3)
nRBC: 0 % (ref 0–0)

## 2012-10-22 MED ORDER — FAMOTIDINE IN NACL 20-0.9 MG/50ML-% IV SOLN
20.0000 mg | Freq: Once | INTRAVENOUS | Status: AC
  Administered 2012-10-22: 20 mg via INTRAVENOUS

## 2012-10-22 MED ORDER — ONDANSETRON 8 MG/NS 50 ML IVPB
INTRAVENOUS | Status: AC
  Filled 2012-10-22: qty 8

## 2012-10-22 MED ORDER — PACLITAXEL CHEMO INJECTION 300 MG/50ML
175.0000 mg/m2 | Freq: Once | INTRAVENOUS | Status: AC
  Administered 2012-10-22: 324 mg via INTRAVENOUS
  Filled 2012-10-22: qty 54

## 2012-10-22 MED ORDER — DIPHENHYDRAMINE HCL 50 MG/ML IJ SOLN
25.0000 mg | Freq: Once | INTRAMUSCULAR | Status: AC
  Administered 2012-10-22: 25 mg via INTRAVENOUS

## 2012-10-22 MED ORDER — DEXAMETHASONE SODIUM PHOSPHATE 20 MG/5ML IJ SOLN
20.0000 mg | Freq: Once | INTRAMUSCULAR | Status: AC
  Administered 2012-10-22: 20 mg via INTRAVENOUS

## 2012-10-22 MED ORDER — SODIUM CHLORIDE 0.9 % IV SOLN
Freq: Once | INTRAVENOUS | Status: AC
  Administered 2012-10-22: 11:00:00 via INTRAVENOUS

## 2012-10-22 MED ORDER — DIPHENHYDRAMINE HCL 50 MG/ML IJ SOLN
INTRAMUSCULAR | Status: AC
  Filled 2012-10-22: qty 1

## 2012-10-22 MED ORDER — HEPARIN SOD (PORK) LOCK FLUSH 100 UNIT/ML IV SOLN
500.0000 [IU] | Freq: Once | INTRAVENOUS | Status: AC | PRN
  Administered 2012-10-22: 500 [IU]
  Filled 2012-10-22: qty 5

## 2012-10-22 MED ORDER — SODIUM CHLORIDE 0.9 % IJ SOLN
10.0000 mL | INTRAMUSCULAR | Status: DC | PRN
  Administered 2012-10-22: 10 mL
  Filled 2012-10-22: qty 10

## 2012-10-22 MED ORDER — FAMOTIDINE IN NACL 20-0.9 MG/50ML-% IV SOLN
INTRAVENOUS | Status: AC
  Administered 2012-10-22: 20 mg via INTRAVENOUS
  Filled 2012-10-22: qty 50

## 2012-10-22 MED ORDER — LORAZEPAM 2 MG/ML IJ SOLN
0.5000 mg | Freq: Once | INTRAMUSCULAR | Status: AC
  Administered 2012-10-22: 0.5 mg via INTRAVENOUS

## 2012-10-22 MED ORDER — LORAZEPAM 2 MG/ML IJ SOLN
INTRAMUSCULAR | Status: AC
  Filled 2012-10-22: qty 1

## 2012-10-22 MED ORDER — DEXAMETHASONE SODIUM PHOSPHATE 20 MG/5ML IJ SOLN
INTRAMUSCULAR | Status: AC
  Administered 2012-10-22: 20 mg via INTRAVENOUS
  Filled 2012-10-22: qty 5

## 2012-10-22 MED ORDER — ONDANSETRON 8 MG/50ML IVPB (CHCC)
8.0000 mg | Freq: Once | INTRAVENOUS | Status: AC
  Administered 2012-10-22: 8 mg via INTRAVENOUS

## 2012-10-22 NOTE — Progress Notes (Signed)
ID: Norma [name]   DOB: 04/02/1965  MR#: [id number]  BMW#:[id number]  PCP: Norma [name] GYN: Norma [name] SU: Norma [name] OTHER MD: Norma [name], Norma [name], Norma [name], Norma [name]   HISTORY OF PRESENT ILLNESS: Norma [name] had routine screening mammography at [facility] 11/16/2010 suggesting additional imaging on the right. This was performed the next day, and found no significant residual abnormality. Repeat screening mammography 03/15/2012 again suggested an area of architectural distortion in the right breast. In the same quadrant as before. Additional views 03/19/2012 found a 3 mm hypoechoic mass in the area in question. Biopsy of this mass 03/20/2012 showed an invasive ductal carcinoma, grade 1-2, triple negative, with an MIB-1 of 100%.  Breast MRI 03/23/2012 showed an area of poorly defined enhancement in the right breast associated with clip artifact. This measured 1 cm. In the left breast there was an enhancing mass measuring 2.0 cm. There were no other suspicious findings. The patient's subsequent history is as detailed below.  INTERVAL HISTORY: Norma [name] returns today accompanied by her sister Norma [name] for followup of her right breast cancer.  She is due for day 1 cycle 3 of 4 planned dose dense cycles of paclitaxel being given in the adjuvant setting. She receives Neulasta on day 2 of each cycle.  Interval history is generally unremarkable, and Norma [name] has few complaints today. Her energy level is fair, but she admits that she has been exercising less due to increased fatigue. She has some shortness of breath with exertion which is stable. She did attend a yoga class this week which she enjoyed.  REVIEW OF SYSTEMS: Norma [name] denies any fevers or chills. She denies any rashes and has had no abnormal bruising or bleeding. She denies any nausea, emesis, diarrhea, or constipation. She's had no new cough, phlegm production, or chest pain. She currently denies any abnormal headaches and has had no dizziness or  change in vision.  She has general achiness following the Neulasta injection, and has had some increased joint pain overall. She's had no peripheral swelling. She denies any problems with peripheral neuropathy in either the upper or lower extremities over the past week.  A detailed review of systems is otherwise noncontributory.   PAST MEDICAL HISTORY: Past Medical History  Diagnosis Date  . Breast cancer   . PONV (postoperative nausea and vomiting)   . Depression     PAST SURGICAL HISTORY: Past Surgical History  Procedure Laterality Date  . Anterior cruciate ligament repair  2010  . Endometrial ablation      2012  . Dilation and curettage of uterus    . Basal cell carcinoma excision      OFF NOSE  2011   . Simple mastectomy w/ sentinel node biopsy Bilateral 06/15/2012    Dr Norma [name]  . Simple mastectomy with axillary sentinel node biopsy Right 06/14/2012    Procedure: SIMPLE MASTECTOMY WITH AXILLARY SENTINEL NODE BIOPSY;  Surgeon: Norma Paris, MD;  Location: [facility] OR;  Service: General;  Laterality: Right;  . Total mastectomy Left 06/14/2012    Procedure: TOTAL MASTECTOMY;  Surgeon: Norma Paris, MD;  Location: [facility] OR;  Service: General;  Laterality: Left;  . Breast reconstruction with placement of tissue expander and flex hd (acellular hydrated dermis) Bilateral 06/14/2012    Procedure: BILATERAL BREAST RECONSTRUCTION WITH PLACEMENT OF BILATERAL TISSUE EXPANDERS;  Surgeon: Norma Sjogren, MD;  Location: [facility] OR;  Service: Plastics;  Laterality: Bilateral;  Moh's surgery for basal cell, Left nares  FAMILY HISTORY Family History  Problem Relation  Age of Onset  . Breast cancer Maternal Aunt     dx in her 12s  . Ovarian cancer Maternal Aunt     dx in her 17s  . Esophageal cancer Maternal Uncle   . Liver cancer Maternal Uncle   . Lung cancer Maternal Uncle   . BRCA 1/2 Maternal Uncle     BRCA 2 +  . Breast cancer Cousin     dx in her 30s; BRCA2+  . Pancreatic cancer Maternal  Uncle     dx in his 63s  . BRCA 1/2 Maternal Uncle     BRCA2+  . Colon cancer Maternal Uncle    the patient's parents are alive, in their early 75s. The patient has one brother and one sister. There is a significant family history of cancer and this includes one of the patient's mother is 2 sisters with ovarian and breast cancer. A first cousin of the patient had breast cancer in her 30s. She has been tested for the BRCA gene, but the patient does not know the results. There were also brothers of the patient's mother with esophageal pancreas and colon cancer. The patient has been scheduled for genetic testing.  GYNECOLOGIC HISTORY: Menarche age 77, first live birth age 49, she is GX P2. The patient's periods are increase in bili scans and irregular, but still ongoing. She did take birth control pills for approximately 10 years with no untoward events.  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 Her husband Norma [name] is a Academic librarian. He owns his own business. He is originally from [name]. Their children are Norma [name] 17 and Norma [name] 14.     ADVANCED DIRECTIVES: Not in place. If both she and her husband become disabled and cannot make her own health care decisions, the patient intends to name her friend Norma [name] as healthcare power of attorney. Otherwise of course she and Norma [name] will be each other self care power of attorney  HEALTH MAINTENANCE: History  Substance Use Topics  . Smoking status: Never Smoker   . Smokeless tobacco: Never Used  . Alcohol Use: No     Colonoscopy:  PAP:  Bone density:  Lipid panel:  Allergies  Allergen Reactions  . Adhesive [Tape] Rash    Rash  . Penicillins Nausea Only and Rash    Current Outpatient Prescriptions  Medication Sig Dispense Refill  . Alum & Mag Hydroxide-Simeth (MAGIC MOUTHWASH W/LIDOCAINE) SOLN Take 5 mLs by mouth 4 (four) times daily as needed (mouth pain).  240 mL  2  . buPROPion  (WELLBUTRIN XL) 300 MG 24 hr tablet Take 300 mg by mouth every morning.       . cephALEXin (KEFLEX) 500 MG capsule Take 1 capsule (500 mg total) by mouth 2 (two) times daily.  14 capsule  3  . dexamethasone (DECADRON) 4 MG tablet Take 2 tablets (8 mg total) by mouth 2 (two) times daily with a meal. As directed with each chemo  30 tablet  0  . docusate sodium (COLACE) 100 MG capsule Take 100 mg by mouth as needed for constipation.      Marland Kitchen escitalopram (LEXAPRO) 20 MG tablet Take 1 tablet (20 mg total) by mouth every morning. Takes 1/2 tablet  30 tablet  2  . lidocaine-prilocaine (EMLA) cream Apply to port 1-2 hr before each procedure  30 g  2  . LORazepam (ATIVAN) 0.5 MG tablet Take 0.5-1 tablets (0.25-0.5 mg total) by mouth  2 (two) times daily as needed (anxiety, insomnia, or nausea).  30 tablet  0  . methocarbamol (ROBAXIN) 500 MG tablet Take 1 tablet (500 mg total) by mouth 4 (four) times daily.  40 tablet  1  . metoCLOPramide (REGLAN) 10 MG tablet Take 1 tablet (10 mg total) by mouth 4 (four) times daily. As needed for nausea  30 tablet  2  . ondansetron (ZOFRAN-ODT) 8 MG disintegrating tablet Take 8 mg by mouth every 8 (eight) hours as needed for nausea.       No current facility-administered medications for this visit.    OBJECTIVE: Middle-aged white woman in no acute distress Filed Vitals:   10/22/12 0941  BP: 109/73  Pulse: 91  Temp: 98.2 F (36.8 C)  Resp: 20     Body mass index is 27.21 kg/(m^2).    ECOG FS: 1 Filed Weights   10/22/12 0941  Weight: 168 lb 8 oz (76.431 kg)   Sclerae unicteric Oropharynx is clear with no ulcerations and no evidence of thrush No cervical or supraclavicular adenopathy Lungs clear to auscultation bilaterally, no crackles or wheezes Heart regular rate and rhythm, no murmur appreciated Abdomen soft, nontender to palpation, positive bowel sounds MSK no focal spinal tenderness to palpation,  No peripheral edema Neuro: nonfocal, well oriented, mildly  anxious affect Breasts: Status post bilateral mastectomies. Expanders are intact. Axillae are benign bilaterally, with no adenopathy palpated. Port is intact in the right upper chest wall, no erythema or edema.    LAB RESULTS: Lab Results  Component Value Date   WBC 6.0 10/22/2012   NEUTROABS 4.2 10/22/2012   HGB 9.3* 10/22/2012   HCT 27.9* 10/22/2012   MCV 99.3 10/22/2012   PLT 176 10/22/2012      Chemistry      Component Value Date/Time   NA 140 10/16/2012 0926   NA 135 06/16/2012 0435   K 4.5 10/16/2012 0926   K 3.2* 06/16/2012 0435   CL 105 08/07/2012 0808   CL 101 06/16/2012 0435   CO2 22 10/16/2012 0926   CO2 27 06/16/2012 0435   BUN 14.3 10/16/2012 0926   BUN 4* 06/16/2012 0435   CREATININE 0.8 10/16/2012 0926   CREATININE 0.66 06/16/2012 0435      Component Value Date/Time   CALCIUM 9.4 10/16/2012 0926   CALCIUM 8.4 06/16/2012 0435   ALKPHOS 150 10/16/2012 0926   ALKPHOS 54 06/07/2012 1053   AST 28 10/16/2012 0926   AST 27 06/07/2012 1053   ALT 66* 10/16/2012 0926   ALT 16 06/07/2012 1053   BILITOT 0.38 10/16/2012 0926   BILITOT 0.3 06/07/2012 1053       Lab Results  Component Value Date   LABCA2 23 03/28/2012     STUDIES:  Echocardiogram on 07/23/2012 showed an ejection fraction of 60%.    ASSESSMENT: 47 y.o. BRCA-2 positive Pura Spice woman   (1)  status post right breast biopsy 03/20/2012 for a clinical T1b N0, stage IA invasive ductal carcinoma, grade 1-2, triple negative, with an MIB-1 of 100%  (2) biopsy of a suspicious left breast mass was benign  (3) status post bilateral mastectomies with right sentinel lymph node sampling and immediate expander placement 06/14/2012, for a right-sided pT1c pN0, stage IA invasive ductal carcinoma, grade 1, with ample margins, triple negative, with an MIB-1 of 100%. (The left breast was benign)  (4)  being treated in the adjuvant setting, the plan being to complete 4 dose dense cycles of doxorubicin/ cyclophosphamide followed by  4 dose dense cycles of  paclitaxel.  (5) Patient will not need adjuvant radiation. Once she is done with active treatment for her breast cancer she will proceed to bilateral salpingo-oophorectomy at her and Dr. Dawayne Patricia discretion.   PLAN:  Baljit will proceed to treatment today as scheduled for day 1 cycle 3 of paclitaxel, and will return tomorrow for Neulasta on day 2. I will see her next week for assessment of chemotoxicity, and in 2 weeks she scheduled for her fourth and final dose of adjuvant chemotherapy on September 22.  Seth voices understanding and agreement with our plan, and will call with any changes or problems.   Sherrilyn Nairn    10/22/2012

## 2012-10-22 NOTE — Patient Instructions (Signed)
[facility] Discharge Instructions for Patients Receiving Chemotherapy  Today you received the following chemotherapy agents: Taxol.  To help prevent nausea and vomiting after your treatment, we encourage you to take your nausea medication as prescribed.   If you develop nausea and vomiting that is not controlled by your nausea medication, call the clinic.   BELOW ARE SYMPTOMS THAT SHOULD BE REPORTED IMMEDIATELY:  *FEVER GREATER THAN 100.5 F  *CHILLS WITH OR WITHOUT FEVER  NAUSEA AND VOMITING THAT IS NOT CONTROLLED WITH YOUR NAUSEA MEDICATION  *UNUSUAL SHORTNESS OF BREATH  *UNUSUAL BRUISING OR BLEEDING  TENDERNESS IN MOUTH AND THROAT WITH OR WITHOUT PRESENCE OF ULCERS  *URINARY PROBLEMS  *BOWEL PROBLEMS  UNUSUAL RASH Items with * indicate a potential emergency and should be followed up as soon as possible.  Feel free to call the clinic you have any questions or concerns. The clinic phone number is [phone].    

## 2012-10-23 ENCOUNTER — Ambulatory Visit (HOSPITAL_BASED_OUTPATIENT_CLINIC_OR_DEPARTMENT_OTHER): Payer: BC Managed Care – PPO

## 2012-10-23 VITALS — BP 113/73 | HR 84 | Temp 98.0°F

## 2012-10-23 DIAGNOSIS — Z5189 Encounter for other specified aftercare: Secondary | ICD-10-CM

## 2012-10-23 DIAGNOSIS — C50419 Malignant neoplasm of upper-outer quadrant of unspecified female breast: Secondary | ICD-10-CM

## 2012-10-23 DIAGNOSIS — Z1501 Genetic susceptibility to malignant neoplasm of breast: Secondary | ICD-10-CM

## 2012-10-23 MED ORDER — PEGFILGRASTIM INJECTION 6 MG/0.6ML
6.0000 mg | Freq: Once | SUBCUTANEOUS | Status: AC
  Administered 2012-10-23: 6 mg via SUBCUTANEOUS
  Filled 2012-10-23: qty 0.6

## 2012-10-29 ENCOUNTER — Other Ambulatory Visit (HOSPITAL_BASED_OUTPATIENT_CLINIC_OR_DEPARTMENT_OTHER): Payer: BC Managed Care – PPO | Admitting: Lab

## 2012-10-29 ENCOUNTER — Ambulatory Visit (HOSPITAL_BASED_OUTPATIENT_CLINIC_OR_DEPARTMENT_OTHER): Payer: BC Managed Care – PPO | Admitting: Physician Assistant

## 2012-10-29 ENCOUNTER — Encounter: Payer: Self-pay | Admitting: Physician Assistant

## 2012-10-29 VITALS — BP 117/84 | HR 84 | Temp 98.3°F | Resp 18 | Ht 66.0 in | Wt 170.4 lb

## 2012-10-29 DIAGNOSIS — F411 Generalized anxiety disorder: Secondary | ICD-10-CM

## 2012-10-29 DIAGNOSIS — Z1501 Genetic susceptibility to malignant neoplasm of breast: Secondary | ICD-10-CM

## 2012-10-29 DIAGNOSIS — K5900 Constipation, unspecified: Secondary | ICD-10-CM

## 2012-10-29 DIAGNOSIS — C50411 Malignant neoplasm of upper-outer quadrant of right female breast: Secondary | ICD-10-CM

## 2012-10-29 DIAGNOSIS — C50419 Malignant neoplasm of upper-outer quadrant of unspecified female breast: Secondary | ICD-10-CM

## 2012-10-29 DIAGNOSIS — Z1509 Genetic susceptibility to other malignant neoplasm: Secondary | ICD-10-CM

## 2012-10-29 DIAGNOSIS — C50919 Malignant neoplasm of unspecified site of unspecified female breast: Secondary | ICD-10-CM

## 2012-10-29 DIAGNOSIS — D649 Anemia, unspecified: Secondary | ICD-10-CM

## 2012-10-29 DIAGNOSIS — F419 Anxiety disorder, unspecified: Secondary | ICD-10-CM

## 2012-10-29 LAB — CBC WITH DIFFERENTIAL/PLATELET
BASO%: 0.3 % (ref 0.0–2.0)
Basophils Absolute: 0.1 10*3/uL (ref 0.0–0.1)
EOS%: 1.4 % (ref 0.0–7.0)
HCT: 26.8 % — ABNORMAL LOW (ref 34.8–46.6)
HGB: 9.3 g/dL — ABNORMAL LOW (ref 11.6–15.9)
MCH: 35.2 pg — ABNORMAL HIGH (ref 25.1–34.0)
MONO#: 1.9 10*3/uL — ABNORMAL HIGH (ref 0.1–0.9)
RDW: 17.7 % — ABNORMAL HIGH (ref 11.2–14.5)
WBC: 22 10*3/uL — ABNORMAL HIGH (ref 3.9–10.3)
lymph#: 1.5 10*3/uL (ref 0.9–3.3)

## 2012-10-29 LAB — COMPREHENSIVE METABOLIC PANEL (CC13)
ALT: 37 U/L (ref 0–55)
AST: 22 U/L (ref 5–34)
Albumin: 4 g/dL (ref 3.5–5.0)
BUN: 12.8 mg/dL (ref 7.0–26.0)
CO2: 25 mEq/L (ref 22–29)
Calcium: 9.8 mg/dL (ref 8.4–10.4)
Chloride: 104 mEq/L (ref 98–109)
Potassium: 4 mEq/L (ref 3.5–5.1)

## 2012-10-29 NOTE — Progress Notes (Signed)
ID: Norma [name]   DOB: [birth year]  MR#: [id number]  WUJ#:[id number]  PCP: Norma [name] GYN: Norma [name] SU: Norma [name] OTHER MD: Norma [name], Norma [name], Norma [name], Norma [name]   HISTORY OF PRESENT ILLNESS: Norma [name] had routine screening mammography at [facility] 11/16/2010 suggesting additional imaging on the right. This was performed the next day, and found no significant residual abnormality. Repeat screening mammography 03/15/2012 again suggested an area of architectural distortion in the right breast. In the same quadrant as before. Additional views 03/19/2012 found a 3 mm hypoechoic mass in the area in question. Biopsy of this mass 03/20/2012 showed an invasive ductal carcinoma, grade 1-2, triple negative, with an MIB-1 of 100%.  Breast MRI 03/23/2012 showed an area of poorly defined enhancement in the right breast associated with clip artifact. This measured 1 cm. In the left breast there was an enhancing mass measuring 2.0 cm. There were no other suspicious findings. The patient's subsequent history is as detailed below.  INTERVAL HISTORY: Norma [name] returns alone today  for followup of her right breast cancer.  She is currently day 8 cycle 3 of 4 planned dose dense cycles of paclitaxel being given in the adjuvant setting. She receives Neulasta on day 2 of each cycle.  Norma [name] continues to tolerate treatment well, and has only 2 new complaints today. She had increased problems with constipation following treatment last week. She tells me she would 3-4 days without a bowel movement, but was finally able to go with the use of stool softeners and MiraLAX. Her bowels have now regulated. She denies any abdominal pain and had no blood in the stool.  She's also had increased pain in the toe nail of the left great toe. It is tender to touch, and her shoes cause some pain. She's had no drainage or evidence of infection. None of her other toenails or fingernails appear to be affected at this time. She  also denies any actual skin changes, cracking, or peeling.  REVIEW OF SYSTEMS: Norma [name] denies any fevers or chills. She denies any rashes and has had no abnormal bruising or bleeding. She's had no oral ulcerations, oral sensitivity, or problems swallowing. She denies any nausea, or emesis. She's had no new cough, phlegm production, or chest pain. She has shortness of breath with exertion which is stable. She currently denies any abnormal headaches and has had no dizziness or change in vision.  She has general bony pain following the Neulasta injection, and has had some increased joint pain overall. She's had no peripheral swelling. She denies any problems with peripheral neuropathy in either the upper or lower extremities over the past week.  A detailed review of systems is otherwise noncontributory.   PAST MEDICAL HISTORY: Past Medical History  Diagnosis Date  . Breast cancer   . PONV (postoperative nausea and vomiting)   . Depression     PAST SURGICAL HISTORY: Past Surgical History  Procedure Laterality Date  . Anterior cruciate ligament repair  2010  . Endometrial ablation      2012  . Dilation and curettage of uterus    . Basal cell carcinoma excision      OFF NOSE  2011   . Simple mastectomy w/ sentinel node biopsy Bilateral 06/15/2012    Dr Norma [name]  . Simple mastectomy with axillary sentinel node biopsy Right 06/14/2012    Procedure: SIMPLE MASTECTOMY WITH AXILLARY SENTINEL NODE BIOPSY;  Surgeon: Norma Paris, MD;  Location: [facility] OR;  Service: General;  Laterality: Right;  .  Total mastectomy Left 06/14/2012    Procedure: TOTAL MASTECTOMY;  Surgeon: Norma Paris, MD;  Location: [facility] OR;  Service: General;  Laterality: Left;  . Breast reconstruction with placement of tissue expander and flex hd (acellular hydrated dermis) Bilateral 06/14/2012    Procedure: BILATERAL BREAST RECONSTRUCTION WITH PLACEMENT OF BILATERAL TISSUE EXPANDERS;  Surgeon: Norma Sjogren, MD;  Location: [facility] OR;   Service: Plastics;  Laterality: Bilateral;  Moh's surgery for basal cell, Left nares  FAMILY HISTORY Family History  Problem Relation Age of Onset  . Breast cancer Maternal Aunt     dx in her 96s  . Ovarian cancer Maternal Aunt     dx in her 3s  . Esophageal cancer Maternal Uncle   . Liver cancer Maternal Uncle   . Lung cancer Maternal Uncle   . BRCA 1/2 Maternal Uncle     BRCA 2 +  . Breast cancer Cousin     dx in her 30s; BRCA2+  . Pancreatic cancer Maternal Uncle     dx in his 65s  . BRCA 1/2 Maternal Uncle     BRCA2+  . Colon cancer Maternal Uncle    the patient's parents are alive, in their early 93s. The patient has one brother and one sister. There is a significant family history of cancer and this includes one of the patient's mother is 2 sisters with ovarian and breast cancer. A first cousin of the patient had breast cancer in her 30s. She has been tested for the BRCA gene, but the patient does not know the results. There were also brothers of the patient's mother with esophageal pancreas and colon cancer. The patient has been scheduled for genetic testing.  GYNECOLOGIC HISTORY: Menarche age 88, first live birth age 47, she is GX P2. The patient's periods are increase in bili scans and irregular, but still ongoing. She did take birth control pills for approximately 10 years with no untoward events.  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 Her husband Norma [name] is a Academic librarian. He owns his own business. He is originally from [place]. Their children are Norma [name] 17 and Norma [name] 14.     ADVANCED DIRECTIVES: Not in place. If both she and her husband become disabled and cannot make her own health care decisions, the patient intends to name her friend Norma [name] as healthcare power of attorney. Otherwise of course she and Norma [name] will be each other self care power of attorney  HEALTH MAINTENANCE: History  Substance Use  Topics  . Smoking status: Never Smoker   . Smokeless tobacco: Never Used  . Alcohol Use: No     Colonoscopy:  PAP:  Bone density:  Lipid panel:  Allergies  Allergen Reactions  . Adhesive [Tape] Rash    Rash  . Penicillins Nausea Only and Rash    Current Outpatient Prescriptions  Medication Sig Dispense Refill  . Alum & Mag Hydroxide-Simeth (MAGIC MOUTHWASH W/LIDOCAINE) SOLN Take 5 mLs by mouth 4 (four) times daily as needed (mouth pain).  240 mL  2  . buPROPion (WELLBUTRIN XL) 300 MG 24 hr tablet Take 300 mg by mouth every morning.       . cephALEXin (KEFLEX) 500 MG capsule Take 1 capsule (500 mg total) by mouth 2 (two) times daily.  14 capsule  3  . dexamethasone (DECADRON) 4 MG tablet Take 2 tablets (8 mg total) by mouth 2 (two) times daily with a meal. As directed  with each chemo  30 tablet  0  . docusate sodium (COLACE) 100 MG capsule Take 100 mg by mouth as needed for constipation.      Marland Kitchen escitalopram (LEXAPRO) 20 MG tablet Take 1 tablet (20 mg total) by mouth every morning. Takes 1/2 tablet  30 tablet  2  . lidocaine-prilocaine (EMLA) cream Apply to port 1-2 hr before each procedure  30 g  2  . LORazepam (ATIVAN) 0.5 MG tablet Take 0.5-1 tablets (0.25-0.5 mg total) by mouth 2 (two) times daily as needed (anxiety, insomnia, or nausea).  30 tablet  0  . methocarbamol (ROBAXIN) 500 MG tablet Take 1 tablet (500 mg total) by mouth 4 (four) times daily.  40 tablet  1  . metoCLOPramide (REGLAN) 10 MG tablet Take 1 tablet (10 mg total) by mouth 4 (four) times daily. As needed for nausea  30 tablet  2  . ondansetron (ZOFRAN-ODT) 8 MG disintegrating tablet Take 8 mg by mouth every 8 (eight) hours as needed for nausea.       No current facility-administered medications for this visit.    OBJECTIVE: Middle-aged white woman in no acute distress Filed Vitals:   10/29/12 1025  BP: 117/84  Pulse: 84  Temp: 98.3 F (36.8 C)  Resp: 18     Body mass index is 27.52 kg/(m^2).    ECOG FS:  1 Filed Weights   10/29/12 1025  Weight: 170 lb 6.4 oz (77.293 kg)   Sclerae unicteric Oropharynx is clear with no ulcerations and no evidence of thrush No cervical or supraclavicular adenopathy Lungs clear to auscultation bilaterally, no crackles or wheezes Heart regular rate and rhythm, no murmur appreciated Abdomen soft, nontender to palpation, positive bowel sounds MSK no focal spinal tenderness to palpation,  No peripheral edema Left great toenail is erythematous, tender to touch, but with no evidence of drainage, or erythema of the skin around the nail itself. Remaining toenails and fingernails are all unremarkable. Neuro: nonfocal, well oriented, mildly anxious affect Breasts: Status post bilateral mastectomies. Expanders are intact. Axillae are benign bilaterally, with no adenopathy palpated. Port is intact in the right upper chest wall, no erythema or edema.    LAB RESULTS: Lab Results  Component Value Date   WBC 22.0* 10/29/2012   NEUTROABS 18.3* 10/29/2012   HGB 9.3* 10/29/2012   HCT 26.8* 10/29/2012   MCV 102.0* 10/29/2012   PLT 223 10/29/2012      Chemistry      Component Value Date/Time   NA 139 10/29/2012 0917   NA 135 06/16/2012 0435   K 4.0 10/29/2012 0917   K 3.2* 06/16/2012 0435   CL 105 08/07/2012 0808   CL 101 06/16/2012 0435   CO2 25 10/29/2012 0917   CO2 27 06/16/2012 0435   BUN 12.8 10/29/2012 0917   BUN 4* 06/16/2012 0435   CREATININE 0.8 10/29/2012 0917   CREATININE 0.66 06/16/2012 0435      Component Value Date/Time   CALCIUM 9.8 10/29/2012 0917   CALCIUM 8.4 06/16/2012 0435   ALKPHOS 129 10/29/2012 0917   ALKPHOS 54 06/07/2012 1053   AST 22 10/29/2012 0917   AST 27 06/07/2012 1053   ALT 37 10/29/2012 0917   ALT 16 06/07/2012 1053   BILITOT 0.52 10/29/2012 0917   BILITOT 0.3 06/07/2012 1053       Lab Results  Component Value Date   LABCA2 23 03/28/2012     STUDIES:  Echocardiogram on 07/23/2012 showed an ejection fraction of 60%.  ASSESSMENT: 47 y.o.  BRCA-2 positive Norma [name] woman   (1)  status post right breast biopsy 03/20/2012 for a clinical T1b N0, stage IA invasive ductal carcinoma, grade 1-2, triple negative, with an MIB-1 of 100%  (2) biopsy of a suspicious left breast mass was benign  (3) status post bilateral mastectomies with right sentinel lymph node sampling and immediate expander placement 06/14/2012, for a right-sided pT1c pN0, stage IA invasive ductal carcinoma, grade 1, with ample margins, triple negative, with an MIB-1 of 100%. (The left breast was benign)  (4)  being treated in the adjuvant setting, the plan being to complete 4 dose dense cycles of doxorubicin/ cyclophosphamide followed by 4 dose dense cycles of paclitaxel.  (5) Patient will not need adjuvant radiation. Once she is done with active treatment for her breast cancer she will proceed to bilateral salpingo-oophorectomy at her and Dr. Dawayne Patricia discretion.   PLAN:  Malaisha has one remaining dose of adjuvant chemotherapy, scheduled for next week on September 22. I will see her that day for repeat labs and physical exam prior to day 1 cycle 4 of paclitaxel.  In the meanwhile, I have suggested that [facility] use water/Listerine soaks for her left great toe. I've also asked her to contact us if the pain worsens, or if there is any redness or evidence of drainage from beneath the nail bed. At that point, we may need to consider an antifungal or perhaps an antibiotic for cellulitis. At this point, however, there is no evidence of infection, so we will simply continue to follow closely.   Fortunately her constipation appears to have resolved, but she will let us know if that recurs.  Sydni voices understanding and agreement with our plan, and will call with any changes or problems.   Sebasthian Stailey    10/29/2012

## 2012-11-05 ENCOUNTER — Other Ambulatory Visit (HOSPITAL_BASED_OUTPATIENT_CLINIC_OR_DEPARTMENT_OTHER): Payer: BC Managed Care – PPO | Admitting: Lab

## 2012-11-05 ENCOUNTER — Encounter: Payer: Self-pay | Admitting: Physician Assistant

## 2012-11-05 ENCOUNTER — Ambulatory Visit (HOSPITAL_BASED_OUTPATIENT_CLINIC_OR_DEPARTMENT_OTHER): Payer: BC Managed Care – PPO | Admitting: Physician Assistant

## 2012-11-05 ENCOUNTER — Ambulatory Visit (HOSPITAL_BASED_OUTPATIENT_CLINIC_OR_DEPARTMENT_OTHER): Payer: BC Managed Care – PPO

## 2012-11-05 VITALS — BP 127/78 | HR 82 | Temp 97.5°F | Resp 18 | Ht 66.0 in | Wt 168.0 lb

## 2012-11-05 DIAGNOSIS — C50419 Malignant neoplasm of upper-outer quadrant of unspecified female breast: Secondary | ICD-10-CM

## 2012-11-05 DIAGNOSIS — F411 Generalized anxiety disorder: Secondary | ICD-10-CM

## 2012-11-05 DIAGNOSIS — D649 Anemia, unspecified: Secondary | ICD-10-CM

## 2012-11-05 DIAGNOSIS — Z5111 Encounter for antineoplastic chemotherapy: Secondary | ICD-10-CM

## 2012-11-05 DIAGNOSIS — Z1501 Genetic susceptibility to malignant neoplasm of breast: Secondary | ICD-10-CM

## 2012-11-05 DIAGNOSIS — C50919 Malignant neoplasm of unspecified site of unspecified female breast: Secondary | ICD-10-CM

## 2012-11-05 DIAGNOSIS — F419 Anxiety disorder, unspecified: Secondary | ICD-10-CM

## 2012-11-05 DIAGNOSIS — C50411 Malignant neoplasm of upper-outer quadrant of right female breast: Secondary | ICD-10-CM

## 2012-11-05 LAB — CBC WITH DIFFERENTIAL/PLATELET
BASO%: 0.2 % (ref 0.0–2.0)
EOS%: 2.3 % (ref 0.0–7.0)
MCH: 33.9 pg (ref 25.1–34.0)
MCHC: 32.7 g/dL (ref 31.5–36.0)
RDW: 16.9 % — ABNORMAL HIGH (ref 11.2–14.5)
WBC: 5.6 10*3/uL (ref 3.9–10.3)
lymph#: 0.8 10*3/uL — ABNORMAL LOW (ref 0.9–3.3)
nRBC: 0 % (ref 0–0)

## 2012-11-05 MED ORDER — LORAZEPAM 2 MG/ML IJ SOLN
0.5000 mg | Freq: Once | INTRAMUSCULAR | Status: AC
  Administered 2012-11-05: 0.5 mg via INTRAVENOUS

## 2012-11-05 MED ORDER — HEPARIN SOD (PORK) LOCK FLUSH 100 UNIT/ML IV SOLN
500.0000 [IU] | Freq: Once | INTRAVENOUS | Status: AC | PRN
  Administered 2012-11-05: 500 [IU]
  Filled 2012-11-05: qty 5

## 2012-11-05 MED ORDER — DEXAMETHASONE SODIUM PHOSPHATE 20 MG/5ML IJ SOLN
INTRAMUSCULAR | Status: AC
  Filled 2012-11-05: qty 5

## 2012-11-05 MED ORDER — FAMOTIDINE IN NACL 20-0.9 MG/50ML-% IV SOLN
20.0000 mg | Freq: Once | INTRAVENOUS | Status: AC
  Administered 2012-11-05: 20 mg via INTRAVENOUS

## 2012-11-05 MED ORDER — DIPHENHYDRAMINE HCL 50 MG/ML IJ SOLN
25.0000 mg | Freq: Once | INTRAMUSCULAR | Status: AC
  Administered 2012-11-05: 25 mg via INTRAVENOUS

## 2012-11-05 MED ORDER — SODIUM CHLORIDE 0.9 % IJ SOLN
10.0000 mL | INTRAMUSCULAR | Status: DC | PRN
  Administered 2012-11-05: 10 mL
  Filled 2012-11-05: qty 10

## 2012-11-05 MED ORDER — ONDANSETRON 8 MG/NS 50 ML IVPB
INTRAVENOUS | Status: AC
  Filled 2012-11-05: qty 8

## 2012-11-05 MED ORDER — FAMOTIDINE IN NACL 20-0.9 MG/50ML-% IV SOLN
INTRAVENOUS | Status: AC
  Filled 2012-11-05: qty 50

## 2012-11-05 MED ORDER — DIPHENHYDRAMINE HCL 50 MG/ML IJ SOLN
INTRAMUSCULAR | Status: AC
  Filled 2012-11-05: qty 1

## 2012-11-05 MED ORDER — PACLITAXEL CHEMO INJECTION 300 MG/50ML
175.0000 mg/m2 | Freq: Once | INTRAVENOUS | Status: AC
  Administered 2012-11-05: 324 mg via INTRAVENOUS
  Filled 2012-11-05: qty 54

## 2012-11-05 MED ORDER — DEXAMETHASONE SODIUM PHOSPHATE 20 MG/5ML IJ SOLN
20.0000 mg | Freq: Once | INTRAMUSCULAR | Status: AC
  Administered 2012-11-05: 20 mg via INTRAVENOUS

## 2012-11-05 MED ORDER — SODIUM CHLORIDE 0.9 % IV SOLN
Freq: Once | INTRAVENOUS | Status: AC
  Administered 2012-11-05: 11:00:00 via INTRAVENOUS

## 2012-11-05 MED ORDER — ONDANSETRON 8 MG/50ML IVPB (CHCC)
8.0000 mg | Freq: Once | INTRAVENOUS | Status: AC
  Administered 2012-11-05: 8 mg via INTRAVENOUS

## 2012-11-05 MED ORDER — LORAZEPAM 2 MG/ML IJ SOLN
INTRAMUSCULAR | Status: AC
  Filled 2012-11-05: qty 1

## 2012-11-05 NOTE — Patient Instructions (Addendum)
[facility] Discharge Instructions for Patients Receiving Chemotherapy  Today you received the following chemotherapy agents: Taxol.  To help prevent nausea and vomiting after your treatment, we encourage you to take your nausea medication as prescribed.   If you develop nausea and vomiting that is not controlled by your nausea medication, call the clinic.   BELOW ARE SYMPTOMS THAT SHOULD BE REPORTED IMMEDIATELY:  *FEVER GREATER THAN 100.5 F  *CHILLS WITH OR WITHOUT FEVER  NAUSEA AND VOMITING THAT IS NOT CONTROLLED WITH YOUR NAUSEA MEDICATION  *UNUSUAL SHORTNESS OF BREATH  *UNUSUAL BRUISING OR BLEEDING  TENDERNESS IN MOUTH AND THROAT WITH OR WITHOUT PRESENCE OF ULCERS  *URINARY PROBLEMS  *BOWEL PROBLEMS  UNUSUAL RASH Items with * indicate a potential emergency and should be followed up as soon as possible.  Feel free to call the clinic you have any questions or concerns. The clinic phone number is [phone].    

## 2012-11-05 NOTE — Progress Notes (Signed)
ID: Norma [name]   DOB: Jun 09, 1965  MR#: [id number]  JYN#:[id number]  PCP: Selena Batten GYN: Gerald Leitz SU: Cicero Duck OTHER MD: Lurline Hare, Rogelia Mire, [place], Etter Sjogren   HISTORY OF PRESENT ILLNESS: Norma [name] had routine screening mammography at [facility] 11/16/2010 suggesting additional imaging on the right. This was performed the next day, and found no significant residual abnormality. Repeat screening mammography 03/15/2012 again suggested an area of architectural distortion in the right breast. In the same quadrant as before. Additional views 03/19/2012 found a 3 mm hypoechoic mass in the area in question. Biopsy of this mass 03/20/2012 showed an invasive ductal carcinoma, grade 1-2, triple negative, with an MIB-1 of 100%.  Breast MRI 03/23/2012 showed an area of poorly defined enhancement in the right breast associated with clip artifact. This measured 1 cm. In the left breast there was an enhancing mass measuring 2.0 cm. There were no other suspicious findings. The patient's subsequent history is as detailed below.  INTERVAL HISTORY: Norma [name] returns  today  accompanied by her sister Norma [name] for followup of her right breast cancer.  She is due for her fourth and final dose dense cycle of paclitaxel today, being given in the adjuvant setting. She receives Neulasta on day 2 of each cycle.  Norma [name] is feeling well, and is excited to be completing her adjuvant chemotherapy today. Her energy level is good. She's had only some fleeting evidence of peripheral neuropathy affecting the fingertips only. She still has some darkening of the toenails, especially the left great toenail, but this is no longer sensitive to touch. She continues to deny any drainage, erythema, or evidence of infection.   REVIEW OF SYSTEMS: Norma [name] denies any recent illnesses and has had no fevers or chills. She's had no significant hot flashes. She denies any rashes and has had no abnormal bruising or bleeding. She's had no  oral ulcerations, oral sensitivity, or problems swallowing. Her appetite is good and she has had no nausea, or emesis. She's currently having normal bowel movements, although she tends to develop some constipation for the first few days following treatment. She's had no dysuria or hematuria. She's had no new cough, phlegm production, or chest pain. She has shortness of breath with exertion which is stable. She currently denies any abnormal headaches and has had no dizziness or change in vision.  She's had some generalized joint pain on and off, but no current myalgias or bony pain.  She's had no peripheral swelling.   A detailed review of systems is otherwise noncontributory.   PAST MEDICAL HISTORY: Past Medical History  Diagnosis Date  . Breast cancer   . PONV (postoperative nausea and vomiting)   . Depression     PAST SURGICAL HISTORY: Past Surgical History  Procedure Laterality Date  . Anterior cruciate ligament repair  2010  . Endometrial ablation      2012  . Dilation and curettage of uterus    . Basal cell carcinoma excision      OFF NOSE  2011   . Simple mastectomy w/ sentinel node biopsy Bilateral 06/15/2012    Dr Jamey Ripa  . Simple mastectomy with axillary sentinel node biopsy Right 06/14/2012    Procedure: SIMPLE MASTECTOMY WITH AXILLARY SENTINEL NODE BIOPSY;  Surgeon: Currie Paris, MD;  Location: [facility] OR;  Service: General;  Laterality: Right;  . Total mastectomy Left 06/14/2012    Procedure: TOTAL MASTECTOMY;  Surgeon: Currie Paris, MD;  Location: [facility] OR;  Service: General;  Laterality: Left;  .  Breast reconstruction with placement of tissue expander and flex hd (acellular hydrated dermis) Bilateral 06/14/2012    Procedure: BILATERAL BREAST RECONSTRUCTION WITH PLACEMENT OF BILATERAL TISSUE EXPANDERS;  Surgeon: Etter Sjogren, MD;  Location: [facility] OR;  Service: Plastics;  Laterality: Bilateral;  Moh's surgery for basal cell, Left nares  FAMILY HISTORY Family History  Problem  Relation Age of Onset  . Breast cancer Maternal Aunt     dx in her 92s  . Ovarian cancer Maternal Aunt     dx in her 54s  . Esophageal cancer Maternal Uncle   . Liver cancer Maternal Uncle   . Lung cancer Maternal Uncle   . BRCA 1/2 Maternal Uncle     BRCA 2 +  . Breast cancer Cousin     dx in her 30s; BRCA2+  . Pancreatic cancer Maternal Uncle     dx in his 49s  . BRCA 1/2 Maternal Uncle     BRCA2+  . Colon cancer Maternal Uncle    the patient's parents are alive, in their early 72s. The patient has one brother and one sister. There is a significant family history of cancer and this includes one of the patient's mother is 2 sisters with ovarian and breast cancer. A first cousin of the patient had breast cancer in her 30s. She has been tested for the BRCA gene, but the patient does not know the results. There were also brothers of the patient's mother with esophageal pancreas and colon cancer. The patient has been scheduled for genetic testing.  GYNECOLOGIC HISTORY: Menarche age 45, first live birth age 54, she is GX P2. The patient's periods are increase in bili scans and irregular, but still ongoing. She did take birth control pills for approximately 10 years with no untoward events.  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 Her husband Norma [name] is a Academic librarian. He owns his own business. He is originally from [place]. Their children are Norma [name] 17 and Norma [name] 14.     ADVANCED DIRECTIVES: Not in place. If both she and her husband become disabled and cannot make her own health care decisions, the patient intends to name her friend Norma [name] as healthcare power of attorney. Otherwise of course she and [name] will be each other self care power of attorney  HEALTH MAINTENANCE: History  Substance Use Topics  . Smoking status: Never Smoker   . Smokeless tobacco: Never Used  . Alcohol Use: No     Colonoscopy: Never  PAP:  Oct  2013, Dr. Richardson Dopp  Bone density:  Never  Lipid panel:  Followed by Dr. Uvaldo Rising  Allergies  Allergen Reactions  . Adhesive [Tape] Rash    Rash  . Penicillins Nausea Only and Rash    Current Outpatient Prescriptions  Medication Sig Dispense Refill  . Alum & Mag Hydroxide-Simeth (MAGIC MOUTHWASH W/LIDOCAINE) SOLN Take 5 mLs by mouth 4 (four) times daily as needed (mouth pain).  240 mL  2  . buPROPion (WELLBUTRIN XL) 300 MG 24 hr tablet Take 300 mg by mouth every morning.       . cephALEXin (KEFLEX) 500 MG capsule Take 1 capsule (500 mg total) by mouth 2 (two) times daily.  14 capsule  3  . dexamethasone (DECADRON) 4 MG tablet Take 2 tablets (8 mg total) by mouth 2 (two) times daily with a meal. As directed with each chemo  30 tablet  0  . docusate sodium (COLACE) 100 MG  capsule Take 100 mg by mouth as needed for constipation.      Norma [name] Kitchen escitalopram (LEXAPRO) 20 MG tablet Take 1 tablet (20 mg total) by mouth every morning. Takes 1/2 tablet  30 tablet  2  . lidocaine-prilocaine (EMLA) cream Apply to port 1-2 hr before each procedure  30 g  2  . LORazepam (ATIVAN) 0.5 MG tablet Take 0.5-1 tablets (0.25-0.5 mg total) by mouth 2 (two) times daily as needed (anxiety, insomnia, or nausea).  30 tablet  0  . methocarbamol (ROBAXIN) 500 MG tablet Take 1 tablet (500 mg total) by mouth 4 (four) times daily.  40 tablet  1  . metoCLOPramide (REGLAN) 10 MG tablet Take 1 tablet (10 mg total) by mouth 4 (four) times daily. As needed for nausea  30 tablet  2  . ondansetron (ZOFRAN-ODT) 8 MG disintegrating tablet Take 8 mg by mouth every 8 (eight) hours as needed for nausea.       No current facility-administered medications for this visit.    OBJECTIVE: Middle-aged white woman in no acute distress Filed Vitals:   11/05/12 0933  BP: 127/78  Pulse: 82  Temp: 97.5 F (36.4 C)  Resp: 18     Body mass index is 27.13 kg/(m^2).    ECOG FS: 1 Filed Weights   11/05/12 0933  Weight: 168 lb (76.204 kg)   Physical  Exam: HEENT:  Sclerae anicteric.  Oropharynx clear. No evidence of ulcerations or candidiasis NODES:  No cervical or supraclavicular lymphadenopathy palpated.  BREAST EXAM:  Patient status post bilateral mastectomies with expanders intact bilaterally. Axillae are benign bilaterally, no adenopathy LUNGS:  Clear to auscultation bilaterally.  No wheezes or rhonchi HEART:  Regular rate and rhythm. No murmur  ABDOMEN:  Soft, nontender.  Positive bowel sounds.  MSK:  No focal spinal tenderness to palpation.  EXTREMITIES:  No peripheral edema.  Full range of motion in the upper extremities SKIN:  Port is intact in the right upper chest wall with no erythema, edema, or evidence of infection There is some hyperpigmentation of the nailbeds bilaterally in the lower extremities. There is no tenderness to touch, no drainage, and no evidence of infection at this time. NEURO:  Nonfocal. Well oriented. Positive affect.     LAB RESULTS: Lab Results  Component Value Date   WBC 5.6 11/05/2012   NEUTROABS 4.1 11/05/2012   HGB 10.0* 11/05/2012   HCT 30.6* 11/05/2012   MCV 103.7* 11/05/2012   PLT 161 11/05/2012      Chemistry      Component Value Date/Time   NA 139 10/29/2012 0917   NA 135 06/16/2012 0435   K 4.0 10/29/2012 0917   K 3.2* 06/16/2012 0435   CL 105 08/07/2012 0808   CL 101 06/16/2012 0435   CO2 25 10/29/2012 0917   CO2 27 06/16/2012 0435   BUN 12.8 10/29/2012 0917   BUN 4* 06/16/2012 0435   CREATININE 0.8 10/29/2012 0917   CREATININE 0.66 06/16/2012 0435      Component Value Date/Time   CALCIUM 9.8 10/29/2012 0917   CALCIUM 8.4 06/16/2012 0435   ALKPHOS 129 10/29/2012 0917   ALKPHOS 54 06/07/2012 1053   AST 22 10/29/2012 0917   AST 27 06/07/2012 1053   ALT 37 10/29/2012 0917   ALT 16 06/07/2012 1053   BILITOT 0.52 10/29/2012 0917   BILITOT 0.3 06/07/2012 1053       Lab Results  Component Value Date   LABCA2 23 03/28/2012  STUDIES:  Echocardiogram on 07/23/2012 showed an ejection fraction of  60%.    ASSESSMENT: 47 y.o. BRCA-2 positive Norma [name] woman   (1)  status post right breast biopsy 03/20/2012 for a clinical T1b N0, stage IA invasive ductal carcinoma, grade 1-2, triple negative, with an MIB-1 of 100%  (2) biopsy of a suspicious left breast mass was benign  (3) status post bilateral mastectomies with right sentinel lymph node sampling and immediate expander placement 06/14/2012, for a right-sided pT1c pN0, stage IA invasive ductal carcinoma, grade 1, with ample margins, triple negative, with an MIB-1 of 100%. (The left breast was benign)  (4)  being treated in the adjuvant setting, the plan being to complete 4 dose dense cycles of doxorubicin/ cyclophosphamide followed by 4 dose dense cycles of paclitaxel.  (5) Patient will not need adjuvant radiation. Once she is done with active treatment for her breast cancer she will proceed to bilateral salpingo-oophorectomy at her and Dr. Dawayne Patricia discretion, hopefully be scheduled in November 2014.   PLAN:  Norma [name] will receive her final dose of adjuvant chemotherapy today as scheduled, cycle 4 of paclitaxel. She will receive Neulasta tomorrow and is scheduled to see Dr. Darnelle Catalan next week on October 2 for assessment of chemotoxicity. At that time they will  also discuss her followup plan.   Norma [name] is planning to undergo reconstructive surgery with Dr. Odis Luster on October 21. She will have her port removed at that time. She is hoping to have her hysterectomy and bilateral salpingo-oophorectomy with Dr. Richardson Dopp in November.Norma [name] voices understanding and agreement with our plan, and will call with any changes or problems.   Norma [name]    11/05/2012

## 2012-11-06 ENCOUNTER — Ambulatory Visit (HOSPITAL_BASED_OUTPATIENT_CLINIC_OR_DEPARTMENT_OTHER): Payer: BC Managed Care – PPO

## 2012-11-06 VITALS — BP 128/78 | HR 92 | Temp 98.3°F

## 2012-11-06 DIAGNOSIS — Z5189 Encounter for other specified aftercare: Secondary | ICD-10-CM

## 2012-11-06 DIAGNOSIS — Z1501 Genetic susceptibility to malignant neoplasm of breast: Secondary | ICD-10-CM

## 2012-11-06 DIAGNOSIS — C50419 Malignant neoplasm of upper-outer quadrant of unspecified female breast: Secondary | ICD-10-CM

## 2012-11-06 MED ORDER — PEGFILGRASTIM INJECTION 6 MG/0.6ML
6.0000 mg | Freq: Once | SUBCUTANEOUS | Status: AC
  Administered 2012-11-06: 6 mg via SUBCUTANEOUS
  Filled 2012-11-06: qty 0.6

## 2012-11-15 ENCOUNTER — Ambulatory Visit (HOSPITAL_BASED_OUTPATIENT_CLINIC_OR_DEPARTMENT_OTHER): Payer: BC Managed Care – PPO | Admitting: Oncology

## 2012-11-15 ENCOUNTER — Other Ambulatory Visit (HOSPITAL_BASED_OUTPATIENT_CLINIC_OR_DEPARTMENT_OTHER): Payer: BC Managed Care – PPO | Admitting: Lab

## 2012-11-15 ENCOUNTER — Telehealth: Payer: Self-pay | Admitting: Oncology

## 2012-11-15 VITALS — BP 107/72 | HR 105 | Temp 98.3°F | Resp 19 | Ht 66.0 in | Wt 171.9 lb

## 2012-11-15 DIAGNOSIS — C50412 Malignant neoplasm of upper-outer quadrant of left female breast: Secondary | ICD-10-CM | POA: Insufficient documentation

## 2012-11-15 DIAGNOSIS — Z171 Estrogen receptor negative status [ER-]: Secondary | ICD-10-CM

## 2012-11-15 DIAGNOSIS — C50419 Malignant neoplasm of upper-outer quadrant of unspecified female breast: Secondary | ICD-10-CM

## 2012-11-15 DIAGNOSIS — C50919 Malignant neoplasm of unspecified site of unspecified female breast: Secondary | ICD-10-CM

## 2012-11-15 LAB — COMPREHENSIVE METABOLIC PANEL (CC13)
ALT: 32 U/L (ref 0–55)
Albumin: 3.9 g/dL (ref 3.5–5.0)
Alkaline Phosphatase: 143 U/L (ref 40–150)
BUN: 13.9 mg/dL (ref 7.0–26.0)
CO2: 24 mEq/L (ref 22–29)
Calcium: 9.6 mg/dL (ref 8.4–10.4)
Potassium: 4.1 mEq/L (ref 3.5–5.1)
Sodium: 138 mEq/L (ref 136–145)
Total Bilirubin: 0.47 mg/dL (ref 0.20–1.20)
Total Protein: 6.6 g/dL (ref 6.4–8.3)

## 2012-11-15 LAB — CBC WITH DIFFERENTIAL/PLATELET
Basophils Absolute: 0.1 10*3/uL (ref 0.0–0.1)
Eosinophils Absolute: 0 10*3/uL (ref 0.0–0.5)
HGB: 9.7 g/dL — ABNORMAL LOW (ref 11.6–15.9)
MCH: 36.1 pg — ABNORMAL HIGH (ref 25.1–34.0)
MONO#: 0.9 10*3/uL (ref 0.1–0.9)
MONO%: 4.4 % (ref 0.0–14.0)
NEUT#: 18.3 10*3/uL — ABNORMAL HIGH (ref 1.5–6.5)
NEUT%: 90 % — ABNORMAL HIGH (ref 38.4–76.8)
Platelets: 173 10*3/uL (ref 145–400)
RBC: 2.69 10*6/uL — ABNORMAL LOW (ref 3.70–5.45)
RDW: 17.4 % — ABNORMAL HIGH (ref 11.2–14.5)
WBC: 20.4 10*3/uL — ABNORMAL HIGH (ref 3.9–10.3)

## 2012-11-15 NOTE — Progress Notes (Signed)
ID: Norma [name]   DOB: 01/24/1966  MR#: [id number]  WUJ#:[id number]  PCP: Norma [name] GYN: Norma [name] SU: Norma [name] OTHER MD: Norma [name], Norma [name], [place], Norma [name]   HISTORY OF PRESENT ILLNESS: Norma [name] had routine screening mammography at [facility] 11/16/2010 suggesting additional imaging on the right. This was performed the next day, and found no significant residual abnormality. Repeat screening mammography 03/15/2012 again suggested an area of architectural distortion in the right breast. In the same quadrant as before. Additional views 03/19/2012 found a 3 mm hypoechoic mass in the area in question. Biopsy of this mass 03/20/2012 showed an invasive ductal carcinoma, grade 1-2, triple negative, with an MIB-1 of 100%.  Breast MRI 03/23/2012 showed an area of poorly defined enhancement in the right breast associated with clip artifact. This measured 1 cm. In the left breast there was an enhancing mass measuring 2.0 cm. There were no other suspicious findings. The patient's subsequent history is as detailed below.  INTERVAL HISTORY: Norma [name] returns  today for followup of [name] breast cancer. Today is day 8 cycle 4 of every 2 week Taxol. She has completed [name] chemotherapy. She will not need postmastectomy radiation given that [name] tumor was node negative and margins were clear. She is not candidate for anti-estrogen since [name] cancer was triple negative.  REVIEW OF SYSTEMS: Norma [name] tells me the very worst part of the chemotherapy was the first cycle where she had "awful vomiting". That was corrected with the next cycles. She did have increasing fatigue with continuing treatments. Of course she really Norma [name] hair. She also Norma [name] breasts, although she is pleased with reconstruction so far. She had no peripheral neuropathy. [name] energy is improving and she has already started a walking program. The left big toe nail is darkened and she may lose it. She has some blurred vision. There is  some test perversion. She is out of breath when walking up stairs or up a slope, but is able to get to the top without stopping. She feels anxious and depressed. A detailed review of systems was otherwise noncontributory.   PAST MEDICAL HISTORY: Past Medical History  Diagnosis Date  . Breast cancer   . PONV (postoperative nausea and vomiting)   . Depression     PAST SURGICAL HISTORY: Past Surgical History  Procedure Laterality Date  . Anterior cruciate ligament repair  2010  . Endometrial ablation      2012  . Dilation and curettage of uterus    . Basal cell carcinoma excision      OFF NOSE  2011   . Simple mastectomy w/ sentinel node biopsy Bilateral 06/15/2012    Dr Norma [name]  . Simple mastectomy with axillary sentinel node biopsy Right 06/14/2012    Procedure: SIMPLE MASTECTOMY WITH AXILLARY SENTINEL NODE BIOPSY;  Surgeon: Norma Paris, MD;  Location: [facility] OR;  Service: General;  Laterality: Right;  . Total mastectomy Left 06/14/2012    Procedure: TOTAL MASTECTOMY;  Surgeon: Norma Paris, MD;  Location: [facility] OR;  Service: General;  Laterality: Left;  . Breast reconstruction with placement of tissue expander and flex hd (acellular hydrated dermis) Bilateral 06/14/2012    Procedure: BILATERAL BREAST RECONSTRUCTION WITH PLACEMENT OF BILATERAL TISSUE EXPANDERS;  Surgeon: Norma Sjogren, MD;  Location[facility] OR;  Service: Plastics;  Laterality: Bilateral;  Moh's surgery for basal cell, Left nares  FAMILY HISTORY Family History  Problem Relation Age of Onset  . Breast cancer Maternal Aunt     dx in  [name] 1s  . Ovarian cancer Maternal Aunt     dx in [name] 37s  . Esophageal cancer Maternal Uncle   . Liver cancer Maternal Uncle   . Lung cancer Maternal Uncle   . BRCA 1/2 Maternal Uncle     BRCA 2 +  . Breast cancer Cousin     dx in [name] 30s; BRCA2+  . Pancreatic cancer Maternal Uncle     dx in his 40s  . BRCA 1/2 Maternal Uncle     BRCA2+  . Colon cancer Maternal Uncle    the  patient's parents are alive, in their early 3s. The patient has one brother and one sister. There is a significant family history of cancer and this includes one of the patient's mother is 2 sisters with ovarian and breast cancer. A first cousin of the patient had breast cancer in [name] 30s. She has been tested for the BRCA gene, but the patient does not know the results. There were also brothers of the patient's mother with esophageal pancreas and colon cancer. The patient has been scheduled for genetic testing.  GYNECOLOGIC HISTORY: Menarche age 64, first live birth age 44, she is GX P2. The patient's periods are increase in bili scans and irregular, but still ongoing. She did take birth control pills for approximately 10 years with no untoward events.  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 [name] husband Norma [name] is a Academic librarian. He owns his own business. He is originally from [place]. Their children are Norma [name]  who is in his first year of college and Norma [name] 15 as of October 2014.     ADVANCED DIRECTIVES: Not in place. If both she and [name] husband become disabled and cannot make [name] own health care decisions, the patient intends to name [name] friend Norma [name] as healthcare power of attorney. Otherwise of course she and Norma [name] will be each other health care power of attorney  HEALTH MAINTENANCE: History  Substance Use Topics  . Smoking status: Never Smoker   . Smokeless tobacco: Never Used  . Alcohol Use: No     Colonoscopy: Never  PAP:  Oct 2013, Dr. Richardson Dopp  Bone density:  Never  Lipid panel:  Followed by Dr. Uvaldo Rising  Allergies  Allergen Reactions  . Adhesive [Tape] Rash    Rash  . Penicillins Nausea Only and Rash    Current Outpatient Prescriptions  Medication Sig Dispense Refill  . Alum & Mag Hydroxide-Simeth (MAGIC MOUTHWASH W/LIDOCAINE) SOLN Take 5 mLs by mouth 4 (four) times daily as needed (mouth pain).  240 mL  2  .  buPROPion (WELLBUTRIN XL) 300 MG 24 hr tablet Take 300 mg by mouth every morning.       . cephALEXin (KEFLEX) 500 MG capsule Take 1 capsule (500 mg total) by mouth 2 (two) times daily.  14 capsule  3  . dexamethasone (DECADRON) 4 MG tablet Take 2 tablets (8 mg total) by mouth 2 (two) times daily with a meal. As directed with each chemo  30 tablet  0  . docusate sodium (COLACE) 100 MG capsule Take 100 mg by mouth as needed for constipation.      Marland Kitchen escitalopram (LEXAPRO) 20 MG tablet Take 1 tablet (20 mg total) by mouth every morning. Takes 1/2 tablet  30 tablet  2  . lidocaine-prilocaine (EMLA) cream Apply to port 1-2 hr before each procedure  30 g  2  . LORazepam (ATIVAN) 0.5  MG tablet Take 0.5-1 tablets (0.25-0.5 mg total) by mouth 2 (two) times daily as needed (anxiety, insomnia, or nausea).  30 tablet  0  . methocarbamol (ROBAXIN) 500 MG tablet Take 1 tablet (500 mg total) by mouth 4 (four) times daily.  40 tablet  1  . metoCLOPramide (REGLAN) 10 MG tablet Take 1 tablet (10 mg total) by mouth 4 (four) times daily. As needed for nausea  30 tablet  2  . ondansetron (ZOFRAN-ODT) 8 MG disintegrating tablet Take 8 mg by mouth every 8 (eight) hours as needed for nausea.       No current facility-administered medications for this visit.    OBJECTIVE: Middle-aged white woman who appears stated age 47 Vitals:   11/15/12 1450  BP: 107/72  Pulse: 105  Temp: 98.3 F (36.8 C)  Resp: 19     Body mass index is 27.76 kg/(m^2).    ECOG FS: 1 Filed Weights   11/15/12 1450  Weight: 171 lb 14.4 oz (77.973 kg)   Sclerae unicteric Oropharynx clear No cervical or supraclavicular adenopathy Lungs no rales or rhonchi Heart regular rate and rhythm Abd soft, nontender, positive bowel sounds MSK no focal spinal tenderness, no peripheral edema Neuro: nonfocal Breasts: Status post bilateral mastectomies with expander is in place. There is mild asymmetry but in general the cosmetic result is good. There is  no evidence of local recurrence. Both axillae are benign  LAB RESULTS: Lab Results  Component Value Date   WBC 20.4* 11/15/2012   NEUTROABS 18.3* 11/15/2012   HGB 9.7* 11/15/2012   HCT 28.7* 11/15/2012   MCV 106.7* 11/15/2012   PLT 173 11/15/2012      Chemistry      Component Value Date/Time   NA 138 11/15/2012 1438   NA 135 06/16/2012 0435   K 4.1 11/15/2012 1438   K 3.2* 06/16/2012 0435   CL 105 08/07/2012 0808   CL 101 06/16/2012 0435   CO2 24 11/15/2012 1438   CO2 27 06/16/2012 0435   BUN 13.9 11/15/2012 1438   BUN 4* 06/16/2012 0435   CREATININE 1.0 11/15/2012 1438   CREATININE 0.66 06/16/2012 0435      Component Value Date/Time   CALCIUM 9.6 11/15/2012 1438   CALCIUM 8.4 06/16/2012 0435   ALKPHOS 143 11/15/2012 1438   ALKPHOS 54 06/07/2012 1053   AST 30 11/15/2012 1438   AST 27 06/07/2012 1053   ALT 32 11/15/2012 1438   ALT 16 06/07/2012 1053   BILITOT 0.47 11/15/2012 1438   BILITOT 0.3 06/07/2012 1053       Lab Results  Component Value Date   LABCA2 23 03/28/2012     STUDIES:  No results found.  ASSESSMENT: 47 y.o. BRCA-2 positive Pura Spice woman   (1)  status post right breast biopsy 03/20/2012 for a clinical T1b N0, stage IA invasive ductal carcinoma, grade 1-2, triple negative, with an MIB-1 of 100%  (2) biopsy of a suspicious left breast mass was benign  (3) status post bilateral mastectomies with right sentinel lymph node sampling and immediate expander placement 06/14/2012, for a right-sided pT1c pN0, stage IA invasive ductal carcinoma, grade 1, with ample margins, triple negative, with an MIB-1 of 100%. (The left breast was benign)  (4) completed 4 dose dense cycles of doxorubicin/ cyclophosphamide followed by 4 dose dense cycles of paclitaxel 11/08/2012  (5) BRCA2 positivity: The patient is status post bilateral mastectomies and is scheduled for hysterectomy and bilateral salpingo-oophorectomy November of 2014   PLAN:  Danel has completed [name] chemotherapy, which she  received without any dose reductions or delay. She is already scheduled for [name] final expander placement under Norma [name] 12/04/2012, and I have asked him if possible to go ahead and remove the port at the same time. She is planning to have a total hysterectomy with bilateral salpingo-oophorectomy under Dr. Richardson Dopp in mid November.  Kelley's tumor was triple negative so she is not a candidate for antiestrogen therapy. She is going to see me again in November just to make sure she continues to improve as far as [name] residual symptoms from chemotherapy is concerned, and then likely we will start every 3 month followup thereafter. I have encouraged [name] to increase [name] exercise program to 45 minutes 5 times a week. We will discuss optimal died at the next visit. She knows to call for any problems that may develop before then.   Cristan Hout C    11/15/2012

## 2012-12-04 ENCOUNTER — Other Ambulatory Visit: Payer: Self-pay | Admitting: Plastic Surgery

## 2012-12-18 ENCOUNTER — Other Ambulatory Visit: Payer: Self-pay | Admitting: Obstetrics and Gynecology

## 2012-12-18 ENCOUNTER — Other Ambulatory Visit (HOSPITAL_COMMUNITY)
Admission: RE | Admit: 2012-12-18 | Discharge: 2012-12-18 | Disposition: A | Discharge status: Home / Self Care | Payer: BC Managed Care – PPO | Source: Ambulatory Visit | Attending: Obstetrics and Gynecology | Admitting: Obstetrics and Gynecology

## 2012-12-18 DIAGNOSIS — Z1151 Encounter for screening for human papillomavirus (HPV): Secondary | ICD-10-CM | POA: Insufficient documentation

## 2012-12-18 DIAGNOSIS — Z01419 Encounter for gynecological examination (general) (routine) without abnormal findings: Secondary | ICD-10-CM | POA: Insufficient documentation

## 2012-12-20 ENCOUNTER — Other Ambulatory Visit: Payer: Self-pay

## 2012-12-26 ENCOUNTER — Other Ambulatory Visit (HOSPITAL_BASED_OUTPATIENT_CLINIC_OR_DEPARTMENT_OTHER): Payer: BC Managed Care – PPO

## 2012-12-26 DIAGNOSIS — C50419 Malignant neoplasm of upper-outer quadrant of unspecified female breast: Secondary | ICD-10-CM

## 2012-12-26 DIAGNOSIS — C50919 Malignant neoplasm of unspecified site of unspecified female breast: Secondary | ICD-10-CM

## 2012-12-26 LAB — COMPREHENSIVE METABOLIC PANEL (CC13)
ALT: 25 U/L (ref 0–55)
AST: 29 U/L (ref 5–34)
Alkaline Phosphatase: 89 U/L (ref 40–150)
Anion Gap: 14 mEq/L — ABNORMAL HIGH (ref 3–11)
Creatinine: 0.8 mg/dL (ref 0.6–1.1)
Glucose: 98 mg/dl (ref 70–140)
Sodium: 140 mEq/L (ref 136–145)
Total Bilirubin: 0.76 mg/dL (ref 0.20–1.20)
Total Protein: 7.8 g/dL (ref 6.4–8.3)

## 2012-12-26 LAB — CBC WITH DIFFERENTIAL/PLATELET
BASO%: 0.6 % (ref 0.0–2.0)
LYMPH%: 26.5 % (ref 14.0–49.7)
MCHC: 34 g/dL (ref 31.5–36.0)
MCV: 98.5 fL (ref 79.5–101.0)
MONO%: 10.3 % (ref 0.0–14.0)
NEUT#: 2.3 10*3/uL (ref 1.5–6.5)
Platelets: 218 10*3/uL (ref 145–400)
RBC: 4.06 10*6/uL (ref 3.70–5.45)
RDW: 13.3 % (ref 11.2–14.5)
WBC: 3.7 10*3/uL — ABNORMAL LOW (ref 3.9–10.3)
lymph#: 1 10*3/uL (ref 0.9–3.3)

## 2012-12-27 ENCOUNTER — Other Ambulatory Visit: Payer: BC Managed Care – PPO

## 2012-12-31 ENCOUNTER — Telehealth: Payer: Self-pay | Admitting: Oncology

## 2012-12-31 ENCOUNTER — Ambulatory Visit (HOSPITAL_BASED_OUTPATIENT_CLINIC_OR_DEPARTMENT_OTHER): Payer: BC Managed Care – PPO | Admitting: Oncology

## 2012-12-31 VITALS — BP 116/81 | HR 76 | Temp 97.8°F | Resp 18 | Ht 66.0 in | Wt 166.7 lb

## 2012-12-31 DIAGNOSIS — C50412 Malignant neoplasm of upper-outer quadrant of left female breast: Secondary | ICD-10-CM

## 2012-12-31 DIAGNOSIS — Z9010 Acquired absence of unspecified breast and nipple: Secondary | ICD-10-CM

## 2012-12-31 DIAGNOSIS — R232 Flushing: Secondary | ICD-10-CM

## 2012-12-31 DIAGNOSIS — Z1501 Genetic susceptibility to malignant neoplasm of breast: Secondary | ICD-10-CM

## 2012-12-31 DIAGNOSIS — C50419 Malignant neoplasm of upper-outer quadrant of unspecified female breast: Secondary | ICD-10-CM

## 2012-12-31 MED ORDER — GABAPENTIN 300 MG PO CAPS: 300.0000 mg | ORAL_CAPSULE | Freq: Every day | ORAL | Status: DC

## 2012-12-31 NOTE — Telephone Encounter (Signed)
, °

## 2012-12-31 NOTE — Progress Notes (Signed)
ID: [name]   DOB: May 28, 1965  MR#: [id number]  WUJ#:[id number]  PCP: Selena Batten GYN: Gerald Leitz SU: Cicero Duck OTHER MD: Lurline Hare, Rogelia Mire, [place], Etter Sjogren   HISTORY OF PRESENT ILLNESS: [name] had routine screening mammography at [facility] 11/16/2010 suggesting additional imaging on the right. This was performed the next day, and found no significant residual abnormality. Repeat screening mammography 03/15/2012 again suggested an area of architectural distortion in the right breast. In the same quadrant as before. Additional views 03/19/2012 found a 3 mm hypoechoic mass in the area in question. Biopsy of this mass 03/20/2012 showed an invasive ductal carcinoma, grade 1-2, triple negative, with an MIB-1 of 100%.  Breast MRI 03/23/2012 showed an area of poorly defined enhancement in the right breast associated with clip artifact. This measured 1 cm. In the left breast there was an enhancing mass measuring 2.0 cm. There were no other suspicious findings. The patient's subsequent history is as detailed below.  INTERVAL HISTORY: Norma [name] returns  today for followup of her breast cancer. Since her last visit here her husband has filed papers for separation and eventual divorce. He is still staying at the house. She has not yet contacted a lawyer  REVIEW OF SYSTEMS: Tresa Endo she completed her reconstruction. She is not entirely satisfied with the way her breast, particularly the left breast, look, but she will be missed sizing" leading them drop" and hopes within 2 or 3 months they will look more natural. She is understandably emotional given the fact that she is going through divorce, cancer, and menopause all at once, but she sampling and remarkably well. She is interested in counseling and we will see if our counselor can help her, although currently [place] cannot drive because of her surgery and that limits her ability regular. She is having significant hot flashes at night and they  do wake her up so we talked about gabapentin today. She is concerned about weight gain. Otherwise a detailed review of systems today was noncontributory   PAST MEDICAL HISTORY: Past Medical History  Diagnosis Date  . Breast cancer   . PONV (postoperative nausea and vomiting)   . Depression     PAST SURGICAL HISTORY: Past Surgical History  Procedure Laterality Date  . Anterior cruciate ligament repair  2010  . Endometrial ablation      2012  . Dilation and curettage of uterus    . Basal cell carcinoma excision      OFF NOSE  2011   . Simple mastectomy w/ sentinel node biopsy Bilateral 06/15/2012    Dr Jamey Ripa  . Simple mastectomy with axillary sentinel node biopsy Right 06/14/2012    Procedure: SIMPLE MASTECTOMY WITH AXILLARY SENTINEL NODE BIOPSY;  Surgeon: Currie Paris, MD;  Location: [facility] OR;  Service: General;  Laterality: Right;  . Total mastectomy Left 06/14/2012    Procedure: TOTAL MASTECTOMY;  Surgeon: Currie Paris, MD;  Location: [facility] OR;  Service: General;  Laterality: Left;  . Breast reconstruction with placement of tissue expander and flex hd (acellular hydrated dermis) Bilateral 06/14/2012    Procedure: BILATERAL BREAST RECONSTRUCTION WITH PLACEMENT OF BILATERAL TISSUE EXPANDERS;  Surgeon: Etter Sjogren, MD;  Location: [facility] OR;  Service: Plastics;  Laterality: Bilateral;  Moh's surgery for basal cell, Left nares  FAMILY HISTORY Family History  Problem Relation Age of Onset  . Breast cancer Maternal Aunt     dx in her 89s  . Ovarian cancer Maternal Aunt     dx in  her 66s  . Esophageal cancer Maternal Uncle   . Liver cancer Maternal Uncle   . Lung cancer Maternal Uncle   . BRCA 1/2 Maternal Uncle     BRCA 2 +  . Breast cancer Cousin     dx in her 30s; BRCA2+  . Pancreatic cancer Maternal Uncle     dx in his 20s  . BRCA 1/2 Maternal Uncle     BRCA2+  . Colon cancer Maternal Uncle    the patient's parents are alive, in their early 41s. The patient has one brother  and one sister. There is a significant family history of cancer and this includes one of the patient's mother is 2 sisters with ovarian and breast cancer. A first cousin of the patient had breast cancer in her 30s. She has been tested for the BRCA gene, but the patient does not know the results. There were also brothers of the patient's mother with esophageal pancreas and colon cancer. The patient has been scheduled for genetic testing.  GYNECOLOGIC HISTORY: Menarche age 21, first live birth age 16, she is GX P2. The patient's periods are increase in bili scans and irregular, but still ongoing. She did take birth control pills for approximately 10 years with no untoward events.  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 Her husband [name] is a Academic librarian. He owns his own business. They are currently in the process of divorce. He is originally from [place]. Their children are [name]  who is in his first year of college and [name] 15 as of October 2014.     ADVANCED DIRECTIVES: Not in place. On 12/31/2012 the patient was given the appropriate papers so she can name a healthcare power of attorney of her choice. If she does get this completed and notarize she will bring [place] a copy to put in her records.  HEALTH MAINTENANCE: History  Substance Use Topics  . Smoking status: Never Smoker   . Smokeless tobacco: Never Used  . Alcohol Use: No     Colonoscopy: Never  PAP:  UTD, Dr. Richardson Dopp  Bone density:  Never  Lipid panel:  Followed by Dr. Uvaldo Rising  Allergies  Allergen Reactions  . Adhesive [Tape] Rash    Rash  . Penicillins Nausea Only and Rash    Current Outpatient Prescriptions  Medication Sig Dispense Refill  . Alum & Mag Hydroxide-Simeth (MAGIC MOUTHWASH W/LIDOCAINE) SOLN Take 5 mLs by mouth 4 (four) times daily as needed (mouth pain).  240 mL  2  . buPROPion (WELLBUTRIN XL) 300 MG 24 hr tablet Take 300 mg by  mouth every morning.       . cephALEXin (KEFLEX) 500 MG capsule Take 1 capsule (500 mg total) by mouth 2 (two) times daily.  14 capsule  3  . dexamethasone (DECADRON) 4 MG tablet Take 2 tablets (8 mg total) by mouth 2 (two) times daily with a meal. As directed with each chemo  30 tablet  0  . docusate sodium (COLACE) 100 MG capsule Take 100 mg by mouth as needed for constipation.      Marland Kitchen escitalopram (LEXAPRO) 20 MG tablet Take 1 tablet (20 mg total) by mouth every morning. Takes 1/2 tablet  30 tablet  2  . lidocaine-prilocaine (EMLA) cream Apply to port 1-2 hr before each procedure  30 g  2  . LORazepam (ATIVAN) 0.5 MG tablet Take 0.5-1 tablets (0.25-0.5 mg total) by  mouth 2 (two) times daily as needed (anxiety, insomnia, or nausea).  30 tablet  0  . methocarbamol (ROBAXIN) 500 MG tablet Take 1 tablet (500 mg total) by mouth 4 (four) times daily.  40 tablet  1  . metoCLOPramide (REGLAN) 10 MG tablet Take 1 tablet (10 mg total) by mouth 4 (four) times daily. As needed for nausea  30 tablet  2  . ondansetron (ZOFRAN-ODT) 8 MG disintegrating tablet Take 8 mg by mouth every 8 (eight) hours as needed for nausea.       No current facility-administered medications for this visit.    OBJECTIVE: Middle-aged white woman Filed Vitals:   12/31/12 1109  BP: 116/81  Pulse: 76  Temp: 97.8 F (36.6 C)  Resp: 18     Body mass index is 26.92 kg/(m^2).    ECOG FS: 1 Filed Weights   12/31/12 1109  Weight: 166 lb 11.2 oz (75.615 kg)   Sclerae unicteric, pupils equal and reactive Oropharynx clear, dentition is fair No cervical or supraclavicular adenopathy Lungs no rales or rhonchi Heart regular rate and rhythm Abd soft, nontender, positive bowel sounds MSK no focal spinal tenderness, no peripheral edema Neuro: nonfocal, well oriented, appropriate affect Breasts: Status post bilateral mastectomies with implants in place. There is mild asymmetry but no evidence of dehiscence, erythema, swelling, or  recurrence. Both axillae are benign  LAB RESULTS: Lab Results  Component Value Date   WBC 3.7* 12/26/2012   NEUTROABS 2.3 12/26/2012   HGB 13.6 12/26/2012   HCT 40.0 12/26/2012   MCV 98.5 12/26/2012   PLT 218 12/26/2012      Chemistry      Component Value Date/Time   NA 140 12/26/2012 1332   NA 135 06/16/2012 0435   K 4.3 12/26/2012 1332   K 3.2* 06/16/2012 0435   CL 105 08/07/2012 0808   CL 101 06/16/2012 0435   CO2 24 12/26/2012 1332   CO2 27 06/16/2012 0435   BUN 9.1 12/26/2012 1332   BUN 4* 06/16/2012 0435   CREATININE 0.8 12/26/2012 1332   CREATININE 0.66 06/16/2012 0435      Component Value Date/Time   CALCIUM 10.6* 12/26/2012 1332   CALCIUM 8.4 06/16/2012 0435   ALKPHOS 89 12/26/2012 1332   ALKPHOS 54 06/07/2012 1053   AST 29 12/26/2012 1332   AST 27 06/07/2012 1053   ALT 25 12/26/2012 1332   ALT 16 06/07/2012 1053   BILITOT 0.76 12/26/2012 1332   BILITOT 0.3 06/07/2012 1053       Lab Results  Component Value Date   LABCA2 23 03/28/2012     STUDIES: No results found.   ASSESSMENT: 47 y.o. BRCA-2 positive Pura Spice woman   (1)  status post right breast biopsy 03/20/2012 for a clinical T1b N0, stage IA invasive ductal carcinoma, grade 1-2, triple negative, with an MIB-1 of 100%  (2) biopsy of a suspicious left breast mass was benign  (3) status post bilateral mastectomies with right sentinel lymph node sampling and immediate expander placement 06/14/2012, for a right-sided pT1c pN0, stage IA invasive ductal carcinoma, grade 1, with ample margins, triple negative,  with an MIB-1 of 100%. (The left breast was benign)  (4) completed 4 dose dense cycles of doxorubicin/ cyclophosphamide followed by 4 dose dense cycles of paclitaxel completed 11/05/2012  (5) BRCA2 positivity: The patient is status post bilateral mastectomies and is scheduled for hysterectomy and bilateral salpingo-oophorectomy 01/21/2013 under Dr. Richardson Dopp  PLAN:  Oprah is going through a perfect storm,  with  menopause, cancer, and divorced also name. She will be contacting her lawyer shortly and has requested counseling, which we will schedule her for. As far as the hot flashes is concerned we are going to try gabapentin 300 mg at bedtime. She will let me know if that does not give her some relief. She is a ready on Lexapro which should help with her daytime hot flashes to some extent.  She is going to see [place] again in 3 months for routine followup. If she still does satisfied with her reconstruction at that time we will consider referral for a second plastic surgery opinion   Zarin Hagmann C    12/31/2012

## 2013-01-07 ENCOUNTER — Encounter (HOSPITAL_COMMUNITY): Payer: Self-pay | Admitting: Pharmacist

## 2013-01-14 ENCOUNTER — Encounter (HOSPITAL_COMMUNITY): Payer: Self-pay

## 2013-01-14 ENCOUNTER — Encounter (HOSPITAL_COMMUNITY)
Admission: RE | Admit: 2013-01-14 | Discharge: 2013-01-14 | Disposition: A | Discharge status: Home / Self Care | Payer: BC Managed Care – PPO | Source: Ambulatory Visit | Attending: Obstetrics and Gynecology | Admitting: Obstetrics and Gynecology

## 2013-01-14 ENCOUNTER — Encounter (HOSPITAL_COMMUNITY): Payer: Self-pay | Admitting: Pharmacy Technician

## 2013-01-14 DIAGNOSIS — Z01818 Encounter for other preprocedural examination: Secondary | ICD-10-CM | POA: Insufficient documentation

## 2013-01-14 DIAGNOSIS — Z01812 Encounter for preprocedural laboratory examination: Secondary | ICD-10-CM | POA: Insufficient documentation

## 2013-01-14 LAB — CBC
HCT: 37.6 % (ref 36.0–46.0)
MCH: 32.8 pg (ref 26.0–34.0)
MCHC: 34.8 g/dL (ref 30.0–36.0)
MCV: 94 fL (ref 78.0–100.0)
RBC: 4 MIL/uL (ref 3.87–5.11)
RDW: 12.3 % (ref 11.5–15.5)

## 2013-01-14 NOTE — Patient Instructions (Addendum)
   Your procedure is scheduled on:  Monday, Dec 8  Enter through the [employer] of [facility] at: 6 AM Pick up the phone at the desk and dial [phone] and inform us of your arrival.  Please call this number if you have any problems the morning of surgery: [phone]  Remember: Do not eat or drink after midnight:  Sunday Take these medicines the morning of surgery with a SIP OF WATER:  Wellbutrin and lexapro  Do not wear jewelry, make-up, or FINGER nail polish No metal in your hair or on your body. Do not wear lotions, powders, perfumes. You may wear deodorant.  Please use your CHG wash as directed prior to surgery.  Do not shave anywhere for at least 12 hours prior to first CHG shower.  Do not bring valuables to the hospital. Contacts, dentures or bridgework may not be worn into surgery.  Leave suitcase in the car. After Surgery it may be brought to your room. For patients being admitted to the hospital, checkout time is 11:00am the day of discharge.  Home with sister [name].

## 2013-01-15 ENCOUNTER — Ambulatory Visit (INDEPENDENT_AMBULATORY_CARE_PROVIDER_SITE_OTHER): Payer: BC Managed Care – PPO | Admitting: Psychiatry

## 2013-01-15 DIAGNOSIS — F1010 Alcohol abuse, uncomplicated: Secondary | ICD-10-CM

## 2013-01-15 DIAGNOSIS — F0630 Mood disorder due to known physiological condition, unspecified: Secondary | ICD-10-CM

## 2013-01-19 ENCOUNTER — Other Ambulatory Visit: Payer: Self-pay | Admitting: Physician Assistant

## 2013-01-20 ENCOUNTER — Other Ambulatory Visit: Payer: Self-pay | Admitting: Obstetrics and Gynecology

## 2013-01-21 ENCOUNTER — Observation Stay (HOSPITAL_COMMUNITY)
Admission: RE | Admit: 2013-01-21 | Discharge: 2013-01-23 | Disposition: A | Discharge status: Home / Self Care | Payer: BC Managed Care – PPO | Source: Ambulatory Visit | Attending: Obstetrics and Gynecology | Admitting: Obstetrics and Gynecology

## 2013-01-21 ENCOUNTER — Encounter (HOSPITAL_COMMUNITY)
Admission: RE | Disposition: A | Discharge status: Home / Self Care | Payer: Self-pay | Source: Ambulatory Visit | Attending: Obstetrics and Gynecology

## 2013-01-21 ENCOUNTER — Ambulatory Visit (HOSPITAL_COMMUNITY): Payer: BC Managed Care – PPO | Admitting: Anesthesiology

## 2013-01-21 ENCOUNTER — Encounter (HOSPITAL_COMMUNITY): Payer: Self-pay | Admitting: Anesthesiology

## 2013-01-21 ENCOUNTER — Encounter (HOSPITAL_COMMUNITY): Payer: BC Managed Care – PPO | Admitting: Anesthesiology

## 2013-01-21 DIAGNOSIS — N838 Other noninflammatory disorders of ovary, fallopian tube and broad ligament: Secondary | ICD-10-CM | POA: Insufficient documentation

## 2013-01-21 DIAGNOSIS — Z1501 Genetic susceptibility to malignant neoplasm of breast: Secondary | ICD-10-CM | POA: Insufficient documentation

## 2013-01-21 DIAGNOSIS — Z90710 Acquired absence of both cervix and uterus: Secondary | ICD-10-CM | POA: Diagnosis present

## 2013-01-21 DIAGNOSIS — D259 Leiomyoma of uterus, unspecified: Secondary | ICD-10-CM | POA: Insufficient documentation

## 2013-01-21 DIAGNOSIS — D696 Thrombocytopenia, unspecified: Secondary | ICD-10-CM

## 2013-01-21 DIAGNOSIS — N920 Excessive and frequent menstruation with regular cycle: Secondary | ICD-10-CM | POA: Insufficient documentation

## 2013-01-21 DIAGNOSIS — C50412 Malignant neoplasm of upper-outer quadrant of left female breast: Secondary | ICD-10-CM

## 2013-01-21 DIAGNOSIS — F329 Major depressive disorder, single episode, unspecified: Secondary | ICD-10-CM

## 2013-01-21 DIAGNOSIS — D649 Anemia, unspecified: Secondary | ICD-10-CM

## 2013-01-21 DIAGNOSIS — N83209 Unspecified ovarian cyst, unspecified side: Secondary | ICD-10-CM | POA: Insufficient documentation

## 2013-01-21 DIAGNOSIS — G4700 Insomnia, unspecified: Secondary | ICD-10-CM | POA: Insufficient documentation

## 2013-01-21 DIAGNOSIS — F419 Anxiety disorder, unspecified: Secondary | ICD-10-CM

## 2013-01-21 DIAGNOSIS — N915 Oligomenorrhea, unspecified: Principal | ICD-10-CM | POA: Insufficient documentation

## 2013-01-21 DIAGNOSIS — C50919 Malignant neoplasm of unspecified site of unspecified female breast: Secondary | ICD-10-CM | POA: Insufficient documentation

## 2013-01-21 HISTORY — PX: LAPAROSCOPIC BILATERAL SALPINGO OOPHERECTOMY: SHX5890

## 2013-01-21 HISTORY — PX: LAPAROSCOPIC ASSISTED VAGINAL HYSTERECTOMY: SHX5398

## 2013-01-21 LAB — BASIC METABOLIC PANEL
BUN: 9 mg/dL (ref 6–23)
Calcium: 9.8 mg/dL (ref 8.4–10.5)
GFR calc non Af Amer: >90 mL/min (ref >90)
Glucose, Bld: 100 mg/dL — ABNORMAL HIGH (ref 70–99)
Sodium: 140 mEq/L (ref 135–145)

## 2013-01-21 LAB — PREGNANCY, URINE: Preg Test, Ur: NEGATIVE

## 2013-01-21 SURGERY — HYSTERECTOMY, VAGINAL, LAPAROSCOPY-ASSISTED
Anesthesia: General | Site: Abdomen

## 2013-01-21 MED ORDER — LIDOCAINE HCL (CARDIAC) 20 MG/ML IV SOLN
INTRAVENOUS | Status: AC
  Filled 2013-01-21: qty 5

## 2013-01-21 MED ORDER — ESTRADIOL 0.1 MG/GM VA CREA
TOPICAL_CREAM | VAGINAL | Status: AC
  Filled 2013-01-21: qty 42.5

## 2013-01-21 MED ORDER — BUPIVACAINE HCL (PF) 0.25 % IJ SOLN
INTRAMUSCULAR | Status: AC
  Filled 2013-01-21: qty 30

## 2013-01-21 MED ORDER — NEOSTIGMINE METHYLSULFATE 1 MG/ML IJ SOLN
INTRAMUSCULAR | Status: DC | PRN
  Administered 2013-01-21: 4 mg via INTRAVENOUS

## 2013-01-21 MED ORDER — HYDROMORPHONE HCL PF 1 MG/ML IJ SOLN
INTRAMUSCULAR | Status: AC
  Filled 2013-01-21: qty 1

## 2013-01-21 MED ORDER — MIDAZOLAM HCL 2 MG/2ML IJ SOLN
INTRAMUSCULAR | Status: DC | PRN
  Administered 2013-01-21: 2 mg via INTRAVENOUS

## 2013-01-21 MED ORDER — SODIUM CHLORIDE 0.9 % IJ SOLN
INTRAMUSCULAR | Status: AC
  Filled 2013-01-21: qty 10

## 2013-01-21 MED ORDER — PROPOFOL 10 MG/ML IV BOLUS
INTRAVENOUS | Status: DC | PRN
  Administered 2013-01-21: 170 mg via INTRAVENOUS

## 2013-01-21 MED ORDER — GLYCOPYRROLATE 0.2 MG/ML IJ SOLN
INTRAMUSCULAR | Status: DC | PRN
  Administered 2013-01-21: 0.6 mg via INTRAVENOUS

## 2013-01-21 MED ORDER — PHENYLEPHRINE 40 MCG/ML (10ML) SYRINGE FOR IV PUSH (FOR BLOOD PRESSURE SUPPORT)
PREFILLED_SYRINGE | INTRAVENOUS | Status: AC
  Filled 2013-01-21: qty 5

## 2013-01-21 MED ORDER — ENOXAPARIN SODIUM 40 MG/0.4ML ~~LOC~~ SOLN
40.0000 mg | SUBCUTANEOUS | Status: DC
  Filled 2013-01-21: qty 0.4

## 2013-01-21 MED ORDER — MIDAZOLAM HCL 2 MG/2ML IJ SOLN
INTRAMUSCULAR | Status: AC
  Filled 2013-01-21: qty 2

## 2013-01-21 MED ORDER — FENTANYL CITRATE 0.05 MG/ML IJ SOLN
INTRAMUSCULAR | Status: AC
  Filled 2013-01-21: qty 2

## 2013-01-21 MED ORDER — KETOROLAC TROMETHAMINE 30 MG/ML IJ SOLN
INTRAMUSCULAR | Status: DC | PRN
  Administered 2013-01-21: 30 mg via INTRAVENOUS

## 2013-01-21 MED ORDER — FENTANYL CITRATE 0.05 MG/ML IJ SOLN
INTRAMUSCULAR | Status: DC | PRN
Start: 2013-01-21 — End: 2013-01-21
  Administered 2013-01-21 (×2): 100 ug via INTRAVENOUS
  Administered 2013-01-21 (×4): 50 ug via INTRAVENOUS

## 2013-01-21 MED ORDER — LIDOCAINE-EPINEPHRINE 1 %-1:100000 IJ SOLN
INTRAMUSCULAR | Status: AC
  Filled 2013-01-21: qty 1

## 2013-01-21 MED ORDER — KETOROLAC TROMETHAMINE 30 MG/ML IJ SOLN: 30.0000 mg | Freq: Four times a day (QID) | INTRAMUSCULAR | Status: DC

## 2013-01-21 MED ORDER — ROCURONIUM BROMIDE 100 MG/10ML IV SOLN
INTRAVENOUS | Status: DC | PRN
  Administered 2013-01-21: 10 mg via INTRAVENOUS
  Administered 2013-01-21: 40 mg via INTRAVENOUS

## 2013-01-21 MED ORDER — IBUPROFEN 800 MG PO TABS: 800.0000 mg | ORAL_TABLET | Freq: Three times a day (TID) | ORAL | Status: DC | PRN

## 2013-01-21 MED ORDER — OXYCODONE-ACETAMINOPHEN 5-325 MG PO TABS
1.0000 | ORAL_TABLET | ORAL | Status: DC | PRN
  Administered 2013-01-22 (×2): 2 via ORAL
  Administered 2013-01-22: 1 via ORAL
  Administered 2013-01-22 – 2013-01-23 (×3): 2 via ORAL
  Filled 2013-01-21 (×3): qty 2
  Filled 2013-01-21: qty 1
  Filled 2013-01-21 (×3): qty 2

## 2013-01-21 MED ORDER — METOCLOPRAMIDE HCL 5 MG/ML IJ SOLN: 10.0000 mg | Freq: Once | INTRAMUSCULAR | Status: DC | PRN

## 2013-01-21 MED ORDER — ROPIVACAINE HCL 5 MG/ML IJ SOLN
INTRAMUSCULAR | Status: DC | PRN
  Administered 2013-01-21: 60 mL

## 2013-01-21 MED ORDER — DEXAMETHASONE SODIUM PHOSPHATE 10 MG/ML IJ SOLN
INTRAMUSCULAR | Status: DC | PRN
  Administered 2013-01-21: 10 mg via INTRAVENOUS

## 2013-01-21 MED ORDER — GABAPENTIN 300 MG PO CAPS
300.0000 mg | ORAL_CAPSULE | Freq: Every day | ORAL | Status: DC
  Administered 2013-01-21 – 2013-01-22 (×2): 300 mg via ORAL
  Filled 2013-01-21 (×2): qty 1

## 2013-01-21 MED ORDER — ESCITALOPRAM OXALATE 20 MG PO TABS
20.0000 mg | ORAL_TABLET | Freq: Every morning | ORAL | Status: DC
  Administered 2013-01-21 – 2013-01-23 (×3): 20 mg via ORAL
  Filled 2013-01-21 (×3): qty 1

## 2013-01-21 MED ORDER — SCOPOLAMINE 1 MG/3DAYS TD PT72
MEDICATED_PATCH | TRANSDERMAL | Status: AC
  Administered 2013-01-21: 1.5 mg via TRANSDERMAL
  Filled 2013-01-21: qty 1

## 2013-01-21 MED ORDER — ONDANSETRON HCL 4 MG/2ML IJ SOLN
INTRAMUSCULAR | Status: DC | PRN
  Administered 2013-01-21: 4 mg via INTRAVENOUS

## 2013-01-21 MED ORDER — VASOPRESSIN 20 UNIT/ML IJ SOLN
INTRAVENOUS | Status: DC | PRN
  Administered 2013-01-21: 09:00:00 via INTRAMUSCULAR

## 2013-01-21 MED ORDER — LACTATED RINGERS IV SOLN
INTRAVENOUS | Status: DC
  Administered 2013-01-21 – 2013-01-22 (×3): via INTRAVENOUS

## 2013-01-21 MED ORDER — FENTANYL CITRATE 0.05 MG/ML IJ SOLN
INTRAMUSCULAR | Status: AC
  Filled 2013-01-21: qty 5

## 2013-01-21 MED ORDER — ROCURONIUM BROMIDE 100 MG/10ML IV SOLN
INTRAVENOUS | Status: AC
  Filled 2013-01-21: qty 1

## 2013-01-21 MED ORDER — HYDROMORPHONE HCL PF 1 MG/ML IJ SOLN
0.2000 mg | INTRAMUSCULAR | Status: DC | PRN
  Administered 2013-01-21 (×2): 0.6 mg via INTRAVENOUS
  Administered 2013-01-21: 0.5 mg via INTRAVENOUS
  Administered 2013-01-21 – 2013-01-22 (×2): 0.6 mg via INTRAVENOUS
  Filled 2013-01-21 (×5): qty 1

## 2013-01-21 MED ORDER — GLYCOPYRROLATE 0.2 MG/ML IJ SOLN
INTRAMUSCULAR | Status: AC
  Filled 2013-01-21: qty 3

## 2013-01-21 MED ORDER — LACTATED RINGERS IV SOLN
INTRAVENOUS | Status: DC
  Administered 2013-01-21 (×2): via INTRAVENOUS

## 2013-01-21 MED ORDER — NEOSTIGMINE METHYLSULFATE 1 MG/ML IJ SOLN
INTRAMUSCULAR | Status: AC
  Filled 2013-01-21: qty 1

## 2013-01-21 MED ORDER — ROPIVACAINE HCL 5 MG/ML IJ SOLN
INTRAMUSCULAR | Status: AC
  Filled 2013-01-21: qty 30

## 2013-01-21 MED ORDER — HEPARIN SODIUM (PORCINE) 5000 UNIT/ML IJ SOLN
INTRAMUSCULAR | Status: AC
  Filled 2013-01-21: qty 1

## 2013-01-21 MED ORDER — LORAZEPAM 0.5 MG PO TABS: 0.2500 mg | ORAL_TABLET | Freq: Two times a day (BID) | ORAL | Status: DC | PRN

## 2013-01-21 MED ORDER — ACETAMINOPHEN 160 MG/5ML PO SOLN
975.0000 mg | Freq: Once | ORAL | Status: AC
  Administered 2013-01-21: 975 mg via ORAL

## 2013-01-21 MED ORDER — SODIUM CHLORIDE 0.9 % IJ SOLN
INTRAMUSCULAR | Status: DC | PRN
  Administered 2013-01-21: 60 mL

## 2013-01-21 MED ORDER — BUPROPION HCL ER (XL) 300 MG PO TB24
300.0000 mg | ORAL_TABLET | Freq: Every morning | ORAL | Status: DC
  Administered 2013-01-21 – 2013-01-23 (×3): 300 mg via ORAL
  Filled 2013-01-21 (×3): qty 1

## 2013-01-21 MED ORDER — LACTATED RINGERS IR SOLN
Status: DC | PRN
  Administered 2013-01-21: 3000 mL

## 2013-01-21 MED ORDER — DEXAMETHASONE SODIUM PHOSPHATE 10 MG/ML IJ SOLN
INTRAMUSCULAR | Status: AC
  Filled 2013-01-21: qty 1

## 2013-01-21 MED ORDER — MEPERIDINE HCL 25 MG/ML IJ SOLN: 6.2500 mg | INTRAMUSCULAR | Status: DC | PRN

## 2013-01-21 MED ORDER — ONDANSETRON HCL 4 MG/2ML IJ SOLN
INTRAMUSCULAR | Status: AC
  Filled 2013-01-21: qty 2

## 2013-01-21 MED ORDER — EPHEDRINE SULFATE 50 MG/ML IJ SOLN
INTRAMUSCULAR | Status: DC | PRN
  Administered 2013-01-21 (×2): 10 mg via INTRAVENOUS

## 2013-01-21 MED ORDER — ACETAMINOPHEN 160 MG/5ML PO SOLN
ORAL | Status: AC
  Filled 2013-01-21: qty 40.6

## 2013-01-21 MED ORDER — DOCUSATE SODIUM 100 MG PO CAPS
100.0000 mg | ORAL_CAPSULE | Freq: Two times a day (BID) | ORAL | Status: DC
  Administered 2013-01-21 – 2013-01-23 (×4): 100 mg via ORAL
  Filled 2013-01-21 (×4): qty 1

## 2013-01-21 MED ORDER — EPHEDRINE 5 MG/ML INJ
INTRAVENOUS | Status: AC
  Filled 2013-01-21: qty 10

## 2013-01-21 MED ORDER — KETOROLAC TROMETHAMINE 30 MG/ML IJ SOLN
INTRAMUSCULAR | Status: AC
  Filled 2013-01-21: qty 1

## 2013-01-21 MED ORDER — VASOPRESSIN 20 UNIT/ML IJ SOLN
INTRAMUSCULAR | Status: AC
  Filled 2013-01-21: qty 1

## 2013-01-21 MED ORDER — PHENYLEPHRINE HCL 10 MG/ML IJ SOLN
INTRAMUSCULAR | Status: DC | PRN
  Administered 2013-01-21 (×2): 80 ug via INTRAVENOUS

## 2013-01-21 MED ORDER — PROPOFOL 10 MG/ML IV EMUL
INTRAVENOUS | Status: AC
  Filled 2013-01-21: qty 20

## 2013-01-21 MED ORDER — SODIUM CHLORIDE 0.9 % IJ SOLN
INTRAMUSCULAR | Status: AC
  Filled 2013-01-21: qty 100

## 2013-01-21 MED ORDER — CLINDAMYCIN PHOSPHATE 900 MG/50ML IV SOLN
900.0000 mg | INTRAVENOUS | Status: AC
  Administered 2013-01-21: 900 mg via INTRAVENOUS
  Filled 2013-01-21: qty 50

## 2013-01-21 MED ORDER — LIDOCAINE HCL (CARDIAC) 20 MG/ML IV SOLN
INTRAVENOUS | Status: DC | PRN
  Administered 2013-01-21: 80 mg via INTRAVENOUS

## 2013-01-21 MED ORDER — CIPROFLOXACIN IN D5W 400 MG/200ML IV SOLN
400.0000 mg | INTRAVENOUS | Status: AC
  Administered 2013-01-21: 400 mg via INTRAVENOUS
  Filled 2013-01-21: qty 200

## 2013-01-21 MED ORDER — SODIUM CHLORIDE 0.9 % IJ SOLN
INTRAMUSCULAR | Status: AC
  Filled 2013-01-21: qty 50

## 2013-01-21 MED ORDER — HYDROMORPHONE HCL PF 1 MG/ML IJ SOLN
0.2500 mg | INTRAMUSCULAR | Status: DC | PRN
  Administered 2013-01-21: 0.5 mg via INTRAVENOUS

## 2013-01-21 MED ORDER — SCOPOLAMINE 1 MG/3DAYS TD PT72
1.0000 | MEDICATED_PATCH | TRANSDERMAL | Status: DC
  Administered 2013-01-21: 1.5 mg via TRANSDERMAL

## 2013-01-21 MED ORDER — BIOTENE DRY MOUTH MT LIQD
15.0000 mL | Freq: Two times a day (BID) | OROMUCOSAL | Status: DC
  Administered 2013-01-22: 15 mL via OROMUCOSAL

## 2013-01-21 MED ORDER — KETOROLAC TROMETHAMINE 30 MG/ML IJ SOLN: 15.0000 mg | Freq: Once | INTRAMUSCULAR | Status: DC | PRN

## 2013-01-21 SURGICAL SUPPLY — 62 items
ADH SKN CLS APL DERMABOND .7 (GAUZE/BANDAGES/DRESSINGS) ×3
APPLICATOR COTTON TIP 6IN STRL (MISCELLANEOUS) ×4 IMPLANT
BAG SPEC RTRVL LRG 6X4 10 (ENDOMECHANICALS)
CABLE HIGH FREQUENCY MONO STRZ (ELECTRODE) IMPLANT
CATH ROBINSON RED A/P 16FR (CATHETERS) IMPLANT
CLOTH BEACON ORANGE TIMEOUT ST (SAFETY) ×4 IMPLANT
CONT PATH 16OZ SNAP LID 3702 (MISCELLANEOUS) ×4 IMPLANT
COVER TABLE BACK 60X90 (DRAPES) ×4 IMPLANT
DECANTER SPIKE VIAL GLASS SM (MISCELLANEOUS) ×12 IMPLANT
DERMABOND ADVANCED (GAUZE/BANDAGES/DRESSINGS) ×1
DERMABOND ADVANCED .7 DNX12 (GAUZE/BANDAGES/DRESSINGS) ×3 IMPLANT
DILATOR CANAL MILEX (MISCELLANEOUS) ×2 IMPLANT
DRAPE STERI URO 9X17 APER PCH (DRAPES) ×2 IMPLANT
ELECT LIGASURE LONG (ELECTRODE) ×2 IMPLANT
ELECT LIGASURE SHORT 9 REUSE (ELECTRODE) IMPLANT
ELECT REM PT RETURN 9FT ADLT (ELECTROSURGICAL)
ELECTRODE REM PT RTRN 9FT ADLT (ELECTROSURGICAL) IMPLANT
FORCEP LYON DISSECTING 33CM (CUTTING FORCEPS) IMPLANT
GLOVE BIO SURGEON STRL SZ 6.5 (GLOVE) ×2 IMPLANT
GLOVE BIO SURGEON STRL SZ7 (GLOVE) ×2 IMPLANT
GLOVE BIOGEL M 6.5 STRL (GLOVE) ×8 IMPLANT
GLOVE BIOGEL PI IND STRL 6.5 (GLOVE) ×7 IMPLANT
GLOVE BIOGEL PI IND STRL 7.0 (GLOVE) ×2 IMPLANT
GLOVE BIOGEL PI INDICATOR 6.5 (GLOVE) ×3
GLOVE BIOGEL PI INDICATOR 7.0 (GLOVE) ×2
GLOVE ECLIPSE 6.5 STRL STRAW (GLOVE) ×2 IMPLANT
GOWN PREVENTION PLUS LG XLONG (DISPOSABLE) ×10 IMPLANT
GOWN PREVENTION PLUS XLARGE (GOWN DISPOSABLE) ×2 IMPLANT
GOWN STRL REIN XL XLG (GOWN DISPOSABLE) ×16 IMPLANT
NDL SPNL 22GX3.5 QUINCKE BK (NEEDLE) IMPLANT
NEEDLE GYRUS 33CM (NEEDLE) IMPLANT
NEEDLE SPNL 22GX3.5 QUINCKE BK (NEEDLE) ×4 IMPLANT
NS IRRIG 1000ML POUR BTL (IV SOLUTION) ×4 IMPLANT
PACK LAPAROSCOPY BASIN (CUSTOM PROCEDURE TRAY) ×2 IMPLANT
PACK LAVH (CUSTOM PROCEDURE TRAY) ×4 IMPLANT
POUCH SPECIMEN RETRIEVAL 10MM (ENDOMECHANICALS) IMPLANT
PROTECTOR NERVE ULNAR (MISCELLANEOUS) ×6 IMPLANT
SCALPEL HARMONIC ACE (MISCELLANEOUS) ×2 IMPLANT
SEALER TISSUE G2 CVD JAW 35 (ENDOMECHANICALS) IMPLANT
SEALER TISSUE G2 CVD JAW 45CM (ENDOMECHANICALS) IMPLANT
SET IRRIG TUBING LAPAROSCOPIC (IRRIGATION / IRRIGATOR) ×2 IMPLANT
SOLUTION ELECTROLUBE (MISCELLANEOUS) IMPLANT
STRIP CLOSURE SKIN 1/4X3 (GAUZE/BANDAGES/DRESSINGS) IMPLANT
SUT VIC AB 0 CT1 18XCR BRD8 (SUTURE) ×8 IMPLANT
SUT VIC AB 0 CT1 27 (SUTURE) ×4
SUT VIC AB 0 CT1 27XCR 8 STRN (SUTURE) ×1 IMPLANT
SUT VIC AB 0 CT1 36 (SUTURE) ×8 IMPLANT
SUT VIC AB 0 CT1 8-18 (SUTURE) ×8
SUT VIC AB 0 CTXB 36 (SUTURE) IMPLANT
SUT VIC AB 4-0 PS2 18 (SUTURE) ×2 IMPLANT
SUT VIC AB 4-0 PS2 27 (SUTURE) ×4 IMPLANT
SUT VICRYL 0 UR6 27IN ABS (SUTURE) ×4 IMPLANT
SUT VICRYL 1 TIES 12X18 (SUTURE) ×4 IMPLANT
SUT VICRYL 4-0 PS2 18IN ABS (SUTURE) IMPLANT
SYR CONTROL 10ML LL (SYRINGE) ×2 IMPLANT
TOWEL OR 17X24 6PK STRL BLUE (TOWEL DISPOSABLE) ×8 IMPLANT
TRAY FOLEY BAG SILVER LF 14FR (CATHETERS) ×2 IMPLANT
TRAY FOLEY CATH 14FR (SET/KITS/TRAYS/PACK) ×4 IMPLANT
TROCAR XCEL NON-BLD 11X100MML (ENDOMECHANICALS) ×2 IMPLANT
TROCAR XCEL NON-BLD 5MMX100MML (ENDOMECHANICALS) ×12 IMPLANT
WARMER LAPAROSCOPE (MISCELLANEOUS) ×4 IMPLANT
WATER STERILE IRR 1000ML POUR (IV SOLUTION) ×2 IMPLANT

## 2013-01-21 NOTE — Anesthesia Procedure Notes (Signed)
Procedure Name: Intubation Date/Time: 01/21/2013 7:49 AM Performed by: Yatzary Merriweather, Jannet Askew Pre-anesthesia Checklist: Patient identified, Timeout performed, Emergency Drugs available, Suction available and Patient being monitored Patient Re-evaluated:Patient Re-evaluated prior to inductionPreoxygenation: Pre-oxygenation with 100% oxygen Intubation Type: IV induction Ventilation: Mask ventilation without difficulty Laryngoscope Size: Mac and 3 Grade View: Grade I Tube type: Oral Tube size: 7.0 mm Number of attempts: 1 Placement Confirmation: ETT inserted through vocal cords under direct vision,  positive ETCO2 and breath sounds checked- equal and bilateral Secured at: 21 cm Dental Injury: Teeth and Oropharynx as per pre-operative assessment

## 2013-01-21 NOTE — Anesthesia Postprocedure Evaluation (Signed)
Anesthesia Post Note  Patient: Norma [name]  Procedure(s) Performed: Procedure(s) (LRB): LAPAROSCOPIC ASSISTED VAGINAL HYSTERECTOMY (N/A) LAPAROSCOPIC BILATERAL SALPINGO OOPHORECTOMY (Bilateral)  Anesthesia type: General  Patient location: [facility] Unit  Post pain: Pain level controlled  Post assessment: Post-op Vital signs reviewed  Last Vitals:  Filed Vitals:   01/21/13 1800  BP:   Pulse: 86  Temp:   Resp:     Post vital signs: Reviewed  Level of consciousness: sedated  Complications: No apparent anesthesia complications

## 2013-01-21 NOTE — Anesthesia Preprocedure Evaluation (Addendum)
Anesthesia Evaluation  Patient identified by MRN, date of birth, ID band Patient awake    Reviewed: Allergy & Precautions, H&P , NPO status , Patient's Chart, lab work & pertinent test results, reviewed documented beta blocker date and time   History of Anesthesia Complications (+) PONV  Airway Mallampati: III TM Distance: >3 FB Neck ROM: full    Dental  (+) Teeth Intact   Pulmonary neg pulmonary ROS,  breath sounds clear to auscultation  Pulmonary exam normal       Cardiovascular Exercise Tolerance: Good Rhythm:regular Rate:Normal  Pre-chemo echo was normal, EF 60% (6/14)   Neuro/Psych PSYCHIATRIC DISORDERS (anxiety/depression) negative neurological ROS     GI/Hepatic negative GI ROS, Neg liver ROS,   Endo/Other  negative endocrine ROS  Renal/GU negative Renal ROS  negative genitourinary   Musculoskeletal   Abdominal   Peds  Hematology Breast CA s/p bilateral mastectomy and chemo (finished chemo 11/05/12)   Anesthesia Other Findings   Reproductive/Obstetrics negative OB ROS                          Anesthesia Physical Anesthesia Plan  ASA: II  Anesthesia Plan: General ETT   Post-op Pain Management:    Induction:   Airway Management Planned:   Additional Equipment:   Intra-op Plan:   Post-operative Plan:   Informed Consent: I have reviewed the patients History and Physical, chart, labs and discussed the procedure including the risks, benefits and alternatives for the proposed anesthesia with the patient or authorized representative who has indicated his/her understanding and acceptance.   Dental Advisory Given  Plan Discussed with: CRNA and Surgeon  Anesthesia Plan Comments:         Anesthesia Quick Evaluation

## 2013-01-21 NOTE — Progress Notes (Signed)
Day of Surgery  Subjective:  per nurse patient saturated 3/4 a pad in 4 hours.. Current pad has been on for an hour. Quarter size amount of blood noted afte pt stood.. Pain is well controlled . No current nausea...   Objective: Vital signs in last 24 hours: Temp:  [97.4 F (36.3 C)-98.3 F (36.8 C)] 97.4 F (36.3 C) (12/08 1630) Pulse Rate:  [69-91] 91 (12/08 1630) Resp:  [16-20] 18 (12/08 1630) BP: (110-130)/(70-83) 110/70 mmHg (12/08 1630) SpO2:  [96 %-100 %] 96 % (12/08 1630)    Intake/Output from previous day: uop over the last 5 hours was 400 cc.    Intake/Output this shift: Total I/O In: 2200 [P.O.:600; I.V.:1600] Out: 975 [Urine:625; Blood:350]  Abdomens soft appropriately tender nondistended hypoactive bowel sounds.  Speculum exam. steril speculum placed. Pt with quarter size clot in the vagina.. Cuff with slight ooze but intact. No heavy bleeding is noted.  Vagina packed with 1 inch guaze moistened with estrace. Pt tolerated packing well.   Lab Results:  No results found for this basename: WBC, HGB, HCT, PLT,  in the last 72 hours BMET  Recent Labs  01/21/13 0645  NA 140  K 4.1  CL 104  CO2 25  GLUCOSE 100*  BUN 9  CREATININE 0.73  CALCIUM 9.8   PT/INR No results found for this basename: LABPROT, INR,  in the last 72 hours ABG No results found for this basename: PHART, PCO2, PO2, HCO3,  in the last 72 hours  Studies/Results: No results found.  Anti-infectives: Anti-infectives   Start     Dose/Rate Route Frequency Ordered Stop   01/21/13 0615  clindamycin (CLEOCIN) IVPB 900 mg     900 mg 100 mL/hr over 30 Minutes Intravenous On call to O.R. 01/21/13 0604 01/21/13 0743   01/21/13 0615  ciprofloxacin (CIPRO) IVPB 400 mg     400 mg 200 mL/hr over 60 Minutes Intravenous On call to O.R. 01/21/13 0604 01/21/13 0751      Assessment/Plan: s/p Procedure(s): LAPAROSCOPIC ASSISTED VAGINAL HYSTERECTOMY (N/A) LAPAROSCOPIC BILATERAL SALPINGO OOPHORECTOMY  (Bilateral) Vaginal bleeding this is very slight oozing that appears to come from vaginal cuff... cuff is intact on exam... vaginal packing placed to offer tamponade. .. Pt to be NPO after midnight in the event that return to the OR is necessary..  Toradol discontinued.. Will hold lovenox that was scheduled to start in the morning.  Dr. Dion Body on call this evening. Case was discussed with her.   LOS: 0 days    Evette Diclemente J. 01/21/2013

## 2013-01-21 NOTE — Anesthesia Postprocedure Evaluation (Signed)
  Anesthesia Post-op Note  Patient: Norma [name]  Procedure(s) Performed: Procedure(s): LAPAROSCOPIC ASSISTED VAGINAL HYSTERECTOMY (N/A) LAPAROSCOPIC BILATERAL SALPINGO OOPHORECTOMY (Bilateral)  Patient Location: PACU  Anesthesia Type:General  Level of Consciousness: awake, alert  and oriented  Airway and Oxygen Therapy: Patient Spontanous Breathing  Post-op Pain: mild  Post-op Assessment: Post-op Vital signs reviewed, Patient's Cardiovascular Status Stable, Respiratory Function Stable, Patent Airway, No signs of Nausea or vomiting and Pain level controlled  Post-op Vital Signs: Reviewed and stable  Complications: No apparent anesthesia complications

## 2013-01-21 NOTE — Op Note (Signed)
01/21/2013  10:04 AM  PATIENT:  [name]  47 y.o. female  PRE-OPERATIVE DIAGNOSIS:  Oligomenorrhea  POST-OPERATIVE DIAGNOSIS:  Oligomenorrhea  PROCEDURE:  Procedure(s): LAPAROSCOPIC ASSISTED VAGINAL HYSTERECTOMY (N/A) LAPAROSCOPIC BILATERAL SALPINGO OOPHORECTOMY (Bilateral)  SURGEON:  Surgeon(s) and Role:    * Uzziel Russey J. Richardson Dopp, MD - Primary    * Geryl Rankins, MD - Assisting  PHYSICIAN ASSISTANT:   ASSISTANTS: none   ANESTHESIA:   general  EBL:  Total I/O In: 1000 [I.V.:1000] Out: 525 [Urine:225; Blood:300]  BLOOD ADMINISTERED:none  DRAINS: Urinary Catheter (Foley)   LOCAL MEDICATIONS USED:  OTHER ropivacaine   SPECIMEN:  Source of Specimen:  uterus cervix bilateral fallopian tubes and ovaries   DISPOSITION OF SPECIMEN:  PATHOLOGY  COUNTS:  YES  TOURNIQUET:  * No tourniquets in log *  DICTATION: .Dragon Dictation  PLAN OF CARE: Admit for overnight observation  PATIENT DISPOSITION:  PACU - hemodynamically stable.   Delay start of Pharmacological VTE agent (>24 hrs) due to surgical blood loss or risk of bleeding: not applicable   Findings: normal fallopian tubes and ovaries   Indication 47 y/o with BRCA mutation and history of menorrhagia.   Procedure: the patient was taken to the operating room placed under general anesthesia. Prepped and draped in the normal sterile fashion. A foley catheter was placed. A uterine manipulator was placed. Attention was turned to the abdomen where the umbilicus was injected with 10 cc of marcaine. A 5 mm trocar was placed under direct visualization. Pneumoperitoneum was achieved with C02 gas... A 5 mm trocar was placed in the right and left lower quadrants. Each trocar site was injected with 10 cc of ropivacaine  prior to trocar placement. . . The left ureter was identified. The left infundibulopelvic ligament was cauterized and transected with the harmonic scalpel. Followed by the broad ligament and the round ligament. This was  repeated on the right side.  Attention was then turned to the vagina. A weighted speculum was placed in to the vagina and the cervix was grasped with a toothed tenaculum. The cervix was then injected circumferentially with vasopressin. The cervix was then circumferentially incised with the Bovie and the bladder was dissected off the pubovesical cervical fascia. The anterior cul-de-sac as entered sharply. The same procedure was performed posteriorly and the posterior cu-lde-sac was entered sharply without difficulty. A Heaney clamp was placed over the uterosacral ligaments bilaterally., These were transected and suture ligated with 0 vicryl. The cardinal ligaments were then grasped with the ligasure cauterized and transected.  The uterine arteries Then clamped with ligasure cauterized and , transected .Excellent hemostasis was visualized. The uterus cervix bilateral fallopian tubes and ovaries were then removed.   The vaginal cuff angles were closed with an angle suture of 0 vicryl and transfixed to the ipsilateral uterosacral ligaments. The remainder of the vaginal cuff was closed with 0 vicryl in a running locked fashion. All instruments were removed from the vagina.  Attention was turned to the abdomen were pneumoperitoneum was reestablished. The pelvis was irrigated pt was noted to have bleeding from the left pelvic side wall.. Hemostasis was obtained with the Kleppinger. . Excellent hemostasis was noted. All trocars were removed under direct visualization . The pneumoperitoneum was released. The skin incisions were closed with 4-0 vicryl and dermabond.  the patient was taken to the recovery room awake and in stable condition.  Sponge lap and needle counts were correct times 2.

## 2013-01-21 NOTE — H&P (Signed)
Subjective:  Chief Complaint(s):   PreOp for 01/21/13   HPI:  General 47 y/o presents for preop history and physical examination in preparation for LAVH/ BSO due to BRCA positive testing. She is without complaints. She completed chemotherapy for breast cancer in september.  Current Medication:  Taking  Flonase 50 MCG/ACT Suspension 2 puffs in each nostril once a day prn, Notes: PRN     Lexapro 20 MG Tablet 1/2 tablet Once a day     BuPROPion HCl 300 MG Tablet Extended Release 24 Hour 1 tablet every morning Once a day     Gabapentin 300 MG Capsule 1 capsule Three times a day, Notes: Taking 1 tablet @ HS   Not-Taking/PRN  Ativan 1 MG Tablet 1 tablet Twice a day     Medication List reviewed and reconciled with the patient   Medical History:   Anxiety with depression     BRCA mutation     Left ACL tear     basal skin cancer nose     breast cancer   Allergies/Intolerance:   Penicillin (for allergy) - rash, vomitting     Adhesive Bandages   Gyn History:   Sexual activity not currently sexually active. Periods : very lite is pt does have one. LMP 06/13/12. Birth control vasectomy. Last pap smear date 12/06/2011. Last mammogram date 04/05/2012. Abnormal pap smear 06/2008 - ASCUS, (-) HR HPV, 08/2008 - Benign reactive changes.   OB History:   OB History G-3 p-2. Pregnancy # 1 live birth, vaginal delivery. Pregnancy # 2 live birth, vaginal delivery.   Surgical History:   ACL repair 2010     Ablation 04/2010     Double Mastectomy Breast Cancer 06/14/12     Reconstruction/Implants 12/04/2012     D&C 1998   Hospitalization:   see surgery list     child birth   Family History:   Father: 65 yrs, back-bone spurs on spine    Mother: 65 yrs, HTN, obesity    Brother 1: 37 yrs    Sister 1: 45 yrs, Dx with DCIS at age 16,, Breast cancer    Maternal aunt: Ovarian cancer   Multiple family members on her mother's side with depression and anxiety.  Social History:  General Tobacco use  cigarettes: Never smoked, Tobacco history last updated 12/18/2012.  no Smoking.  Alcohol: yes, 2 drinks beer per day.  Caffeine: yes, coffee.  no Recreational drug use.  no Diet.  Exercise: yes, exercises 5 days a week, - elliptical/bike (2 hours average each day). Playing tennis once a week, but knee is limiting..  Occupation: stay at [employer].  Marital Status: Married.  Children: 56 year old son and 74 year old autistic son.  Seat belt use: yes.  Full time parenting. Oldest son with ADD, younger autistic.  ROS: Negative except as stated in HPI.   Objective:  Vitals:  Wt 172, Wt change 3 lb, Temp 98.8, Pulse sitting 70, BP sitting 118/78  Past Results:  Examination:  General Examination alert, oriented, NAD" label="GENERAL APPEARANCE" categoryPropId="[id number]" examid="[id number]"GENERAL APPEARANCE alert, oriented, NAD.  clear to auscultation bilaterally" label="LUNGS:" categoryPropId="87" examid="[id number]"LUNGS: clear to auscultation bilaterally.  regular rate and rhythm" label="HEART:" categoryPropId="86" examid="[id number]"HEART: regular rate and rhythm.  soft, non-tender/non-distended, bowel sounds present" label="ABDOMEN:" categoryPropId="88" examid="[id number]"ABDOMEN: soft, non-tender/non-distended, bowel sounds present.  normal external genitalia, labia - unremarkable, vagina - pink moist mucosa, no lesions or abnormal discharge, cervix - no discharge or lesions or CMT, adnexa - no masses or tenderness,  uterus - nontender and normal size on palpation" label="FEMALE GENITOURINARY:" categoryPropId="[id number]" examid="[id number]"FEMALE GENITOURINARY: normal external genitalia, labia - unremarkable, vagina - pink moist mucosa, no lesions or abnormal discharge, cervix - no discharge or lesions or CMT, adnexa - no masses or tenderness, uterus - nontender and normal size on palpation.  no edema present" label="EXTREMITIES:" categoryPropId="89" examid="[id number]"EXTREMITIES: no edema present.  Physical  Examination:   Assessment:  Assessment:  BRCA gene positive - V84.01 (Primary)     Plan:  Treatment:  BRCA gene positive  Notes: given increased r/o ovarian cancer associatd with BRCA gene mutation. pt desires prophylactic hysterectomy with BSO. plan on LAVh /BSO. r/b/a of surgery were discussed with the patient including but not limited to infection / bleeding/ damage to bowel bladder ureters with the need for further surgery. r/o transfusion / hiv/ hep b&C discussed.. pt voiced understanding and desires to proceed with LAVH/ BSO.

## 2013-01-21 NOTE — Transfer of Care (Signed)
Immediate Anesthesia Transfer of Care Note  Patient: [name]  Procedure(s) Performed: Procedure(s): LAPAROSCOPIC ASSISTED VAGINAL HYSTERECTOMY (N/A) LAPAROSCOPIC BILATERAL SALPINGO OOPHORECTOMY (Bilateral)  Patient Location: PACU  Anesthesia Type:General  Level of Consciousness: awake, alert  and oriented  Airway & Oxygen Therapy: Patient Spontanous Breathing and Patient connected to nasal cannula oxygen  Post-op Assessment: Report given to PACU RN and Post -op Vital signs reviewed and stable  Post vital signs: Reviewed and stable  Complications: No apparent anesthesia complications

## 2013-01-22 ENCOUNTER — Encounter (HOSPITAL_COMMUNITY): Payer: Self-pay | Admitting: Obstetrics and Gynecology

## 2013-01-22 LAB — CBC
MCH: 31.9 pg (ref 26.0–34.0)
MCV: 91.6 fL (ref 78.0–100.0)
Platelets: 177 10*3/uL (ref 150–400)
RDW: 12.2 % (ref 11.5–15.5)
WBC: 6.6 10*3/uL (ref 4.0–10.5)

## 2013-01-22 MED ORDER — LORAZEPAM 1 MG PO TABS: 0.5000 mg | ORAL_TABLET | Freq: Two times a day (BID) | ORAL | Status: DC | PRN

## 2013-01-22 MED ORDER — ZOLPIDEM TARTRATE 5 MG PO TABS
5.0000 mg | ORAL_TABLET | Freq: Every evening | ORAL | Status: DC | PRN
  Administered 2013-01-22: 5 mg via ORAL
  Filled 2013-01-22: qty 1

## 2013-01-22 MED ORDER — FERRIC SUBSULFATE 259 MG/GM EX SOLN
CUTANEOUS | Status: AC
  Filled 2013-01-22: qty 8

## 2013-01-22 MED FILL — Heparin Sodium (Porcine) Inj 5000 Unit/ML: INTRAMUSCULAR | Qty: 1 | Status: AC

## 2013-01-22 NOTE — Progress Notes (Signed)
UR completed 

## 2013-01-22 NOTE — Progress Notes (Signed)
Subjective: Patient reports tolerating PO and + flatus.  No further vaginal bleeding since monsels was placed Patient request medication to help with sleep   Objective: I have reviewed patient's vital signs, intake and output, medications and labs.  General: alert and cooperative GI: soft appropriately tender nondistended + BS ..inicisions healing well  Extremities: extremities normal, atraumatic, no cyanosis or edema   Assessment/Plan: POD #1 s/p lavh/ bso  Vaginal bleeding has resolved s/p monsels.  Plan to observe overnight if no further bleeding discharge in AM .  Insomnia- Ambien    LOS: 1 day    Norma [name] J. 01/22/2013, 5:40 PM

## 2013-01-22 NOTE — Progress Notes (Signed)
1 Day Post-Op s/p LAVH/BSO Late entry from 900am  Subjective:  pt reports pain well controlled.. Foley catheter still in place..  Objective: Vital signs in last 24 hours: Temp:  [97.2 F (36.2 C)-98.2 F (36.8 C)] 98.2 F (36.8 C) (12/09 1359) Pulse Rate:  [72-91] 72 (12/09 1359) Resp:  [18] 18 (12/09 1359) BP: (96-116)/(62-81) 110/72 mmHg (12/09 1359) SpO2:  [95 %-100 %] 100 % (12/09 1359) Weight:  [77.111 kg (170 lb)] 77.111 kg (170 lb) (12/08 2225) Last BM Date: 01/20/13  Intake/Output from previous day: 12/08 0701 - 12/09 0700 In: 5079.4 [P.O.:1260; I.V.:3819.4] Out: 3725 [Urine:3375; Blood:350] Intake/Output this shift: Total I/O In: -  Out: 2100 [Urine:2100]  General appearance: alert and cooperative GI: soft appropriately tender nondistended +BS ..incisions well approximated.  Pelvic: external genitalia normal and Vaginal packing removed.Marland Kitchen almost completely saturated with blood. speculum exam performed small amount of ooze from vaginal cuff that appears intact... monsels applied. no further bleeding noted  Extremities: extremities normal, atraumatic, no cyanosis or edema  Lab Results:   Recent Labs  01/22/13 0515  WBC 6.6  HGB 11.0*  HCT 31.6*  PLT 177   BMET  Recent Labs  01/21/13 0645  NA 140  K 4.1  CL 104  CO2 25  GLUCOSE 100*  BUN 9  CREATININE 0.73  CALCIUM 9.8   PT/INR No results found for this basename: LABPROT, INR,  in the last 72 hours ABG No results found for this basename: PHART, PCO2, PO2, HCO3,  in the last 72 hours  Studies/Results: No results found.  Anti-infectives: Anti-infectives   Start     Dose/Rate Route Frequency Ordered Stop   01/21/13 0615  clindamycin (CLEOCIN) IVPB 900 mg     900 mg 100 mL/hr over 30 Minutes Intravenous On call to O.R. 01/21/13 0604 01/21/13 0743   01/21/13 0615  ciprofloxacin (CIPRO) IVPB 400 mg     400 mg 200 mL/hr over 60 Minutes Intravenous On call to O.R. 01/21/13 0604 01/21/13 0751       Assessment/Plan: s/p Procedure(s): LAPAROSCOPIC ASSISTED VAGINAL HYSTERECTOMY (N/A) LAPAROSCOPIC BILATERAL SALPINGO OOPHORECTOMY (Bilateral) Advance diet Awaiting return of bladder function Vaginal bleeding subsided with application of monsels.. Will monitor bleeding throughout the day.. If bleeding becomes heavy plan return to OR... Pt voiced understanding.   LOS: 1 day    Anabelle Bungert J. 01/22/2013

## 2013-01-23 MED ORDER — OXYCODONE-ACETAMINOPHEN 5-325 MG PO TABS: 1.0000 | ORAL_TABLET | ORAL | Status: DC | PRN

## 2013-01-23 MED ORDER — IBUPROFEN 800 MG PO TABS: 800.0000 mg | ORAL_TABLET | Freq: Three times a day (TID) | ORAL | Status: DC | PRN

## 2013-01-23 MED ORDER — ZOLPIDEM TARTRATE 5 MG PO TABS: 5.0000 mg | ORAL_TABLET | Freq: Every evening | ORAL | Status: DC | PRN

## 2013-01-23 NOTE — Progress Notes (Signed)
Pt ambulated out  Teaching complete emotional support  given

## 2013-01-23 NOTE — Discharge Summary (Signed)
Physician Discharge Summary  Patient ID: Norma [name] MRN: [id number] DOB/AGE: [birth year] 48 y.o.  Admit date: 01/21/2013 Discharge date: 01/23/2013  Admission Diagnoses: 1 BRCA mutation 2. Menorrhagia   Discharge Diagnoses:  Active Problems:   S/P laparoscopic assisted vaginal hysterectomy (LAVH)   Discharged Condition: stable  Hospital Course: pt was admitted for observation after LAVH on 01/21/2013. She had vaginal bleeding on pod 0 that was moderate. Examination of the pelvic cuff revealed slight oozing.. Vaginal packing was placed... The next morning on POD #1 Monsels was applied to the vaginal cuff. No further bleeding was noted during her hospitalization   Consults: None  Significant Diagnostic Studies: labs: hgp po#1 was 11.0  Treatments: surgery: LAVH/BSO  Discharge Exam: Blood pressure 93/51, pulse 65, temperature 97.6 F (36.4 C), temperature source Oral, resp. rate 18, height 5\' 6"  (1.676 m), weight 77.111 kg (170 lb), SpO2 97.00%. General appearance: alert and cooperative GI: soft nontender nondistended +BS incision healing well  Extremities: extremities normal, atraumatic, no cyanosis or edema  Disposition: 01-Home or Self Care  Discharge Orders   Future Appointments Provider Department Dept Phone   04/01/2013 10:15 AM [facility] [phone]   04/01/2013 10:45 AM [name] Allegra Grana, PA-C [facility] [phone]   Future Orders Complete By Expires   Call MD for:  persistant nausea and vomiting  As directed    Call MD for:  severe uncontrolled pain  As directed    Call MD for:  temperature >100.4  As directed    Diet general  As directed    Driving Restrictions  As directed    Comments:     Avoid driving for 2 weeks   Increase activity slowly  As directed    Lifting restrictions  As directed    Comments:     Do not lift over 10 lbs   Sexual Activity Restrictions  As directed    Comments:     Avoid sex for 6 weeks       Medication List         buPROPion 300 MG 24 hr tablet  Commonly known as:  WELLBUTRIN XL  Take 300 mg by mouth every morning.     escitalopram 20 MG tablet  Commonly known as:  LEXAPRO  TAKE 1 TABLET BY MOUTH EVERY DAY     gabapentin 300 MG capsule  Commonly known as:  NEURONTIN  Take 1 capsule (300 mg total) by mouth at bedtime.     ibuprofen 800 MG tablet  Commonly known as:  ADVIL,MOTRIN  Take 1 tablet (800 mg total) by mouth every 8 (eight) hours as needed for moderate pain (mild pain).     LORazepam 0.5 MG tablet  Commonly known as:  ATIVAN  Take 0.5-1 tablets (0.25-0.5 mg total) by mouth 2 (two) times daily as needed (anxiety, insomnia, or nausea).     oxyCODONE-acetaminophen 5-325 MG per tablet  Commonly known as:  PERCOCET/ROXICET  Take 1-2 tablets by mouth every 4 (four) hours as needed for severe pain (moderate to severe pain (when tolerating fluids)).     zolpidem 5 MG tablet  Commonly known as:  AMBIEN  Take 1 tablet (5 mg total) by mouth at bedtime as needed for sleep.           Follow-up Information   Follow up with Jessee Avers., MD. Schedule an appointment as soon as possible for a visit in 2 weeks. (patient may already  have an appointment )    Specialty:  Obstetrics and Gynecology   Contact information:   [address] [place] [address] [phone]       Signed: Jessee Avers. 01/23/2013, 8:57 AM

## 2013-01-23 NOTE — Progress Notes (Signed)
2 Days Post-Op LAVH/BSO Subjective:  pt denies bleeding overnight... +Flatus + void pain is well controlled   Objective: Vital signs in last 24 hours: Temp:  [97.5 F (36.4 C)-98.2 F (36.8 C)] 97.6 F (36.4 C) (12/10 0524) Pulse Rate:  [65-80] 65 (12/10 0524) Resp:  [18] 18 (12/10 0524) BP: (93-116)/(51-81) 93/51 mmHg (12/10 0524) SpO2:  [97 %-100 %] 97 % (12/10 0524) Last BM Date: 01/20/13  Intake/Output from previous day: 12/09 0701 - 12/10 0700 In: -  Out: 3300 [Urine:3300] Intake/Output this shift:    General appearance: alert and cooperative GI: soft appropriately tender nondistended + BS .. incision healing well wound edges well approximated.  Extremities: extremities normal, atraumatic, no cyanosis or edema  Lab Results:   Recent Labs  01/22/13 0515  WBC 6.6  HGB 11.0*  HCT 31.6*  PLT 177   BMET  Recent Labs  01/21/13 0645  NA 140  K 4.1  CL 104  CO2 25  GLUCOSE 100*  BUN 9  CREATININE 0.73  CALCIUM 9.8   PT/INR No results found for this basename: LABPROT, INR,  in the last 72 hours ABG No results found for this basename: PHART, PCO2, PO2, HCO3,  in the last 72 hours  Studies/Results: No results found.  Anti-infectives: Anti-infectives   Start     Dose/Rate Route Frequency Ordered Stop   01/21/13 0615  clindamycin (CLEOCIN) IVPB 900 mg     900 mg 100 mL/hr over 30 Minutes Intravenous On call to O.R. 01/21/13 0604 01/21/13 0743   01/21/13 0615  ciprofloxacin (CIPRO) IVPB 400 mg     400 mg 200 mL/hr over 60 Minutes Intravenous On call to O.R. 01/21/13 0604 01/21/13 0751      Assessment/Plan: s/p Procedure(s): LAPAROSCOPIC ASSISTED VAGINAL HYSTERECTOMY (N/A) LAPAROSCOPIC BILATERAL SALPINGO OOPHORECTOMY (Bilateral) Discharge Follow up with Dr. Richardson Dopp in 2 weeks   LOS: 2 days    Alhassan Everingham J. 01/23/2013

## 2013-02-05 ENCOUNTER — Ambulatory Visit (INDEPENDENT_AMBULATORY_CARE_PROVIDER_SITE_OTHER): Payer: BC Managed Care – PPO | Admitting: Psychiatry

## 2013-02-05 DIAGNOSIS — F1010 Alcohol abuse, uncomplicated: Secondary | ICD-10-CM

## 2013-02-05 DIAGNOSIS — F0630 Mood disorder due to known physiological condition, unspecified: Secondary | ICD-10-CM

## 2013-02-20 ENCOUNTER — Ambulatory Visit: Payer: BC Managed Care – PPO | Admitting: Psychiatry

## 2013-03-11 ENCOUNTER — Other Ambulatory Visit: Payer: Self-pay | Admitting: Physician Assistant

## 2013-04-01 ENCOUNTER — Ambulatory Visit (HOSPITAL_BASED_OUTPATIENT_CLINIC_OR_DEPARTMENT_OTHER): Payer: BC Managed Care – PPO | Admitting: Physician Assistant

## 2013-04-01 ENCOUNTER — Encounter: Payer: Self-pay | Admitting: *Deleted

## 2013-04-01 ENCOUNTER — Telehealth: Payer: Self-pay | Admitting: Physician Assistant

## 2013-04-01 ENCOUNTER — Encounter: Payer: Self-pay | Admitting: Physician Assistant

## 2013-04-01 ENCOUNTER — Other Ambulatory Visit (HOSPITAL_BASED_OUTPATIENT_CLINIC_OR_DEPARTMENT_OTHER): Payer: BC Managed Care – PPO

## 2013-04-01 VITALS — BP 127/88 | HR 76 | Temp 98.3°F | Resp 18 | Ht 66.0 in | Wt 172.2 lb

## 2013-04-01 DIAGNOSIS — C50412 Malignant neoplasm of upper-outer quadrant of left female breast: Secondary | ICD-10-CM

## 2013-04-01 DIAGNOSIS — F329 Major depressive disorder, single episode, unspecified: Secondary | ICD-10-CM

## 2013-04-01 DIAGNOSIS — Z171 Estrogen receptor negative status [ER-]: Secondary | ICD-10-CM

## 2013-04-01 DIAGNOSIS — E8941 Symptomatic postprocedural ovarian failure: Secondary | ICD-10-CM

## 2013-04-01 DIAGNOSIS — Z1509 Genetic susceptibility to other malignant neoplasm: Secondary | ICD-10-CM

## 2013-04-01 DIAGNOSIS — C50419 Malignant neoplasm of upper-outer quadrant of unspecified female breast: Secondary | ICD-10-CM

## 2013-04-01 DIAGNOSIS — R748 Abnormal levels of other serum enzymes: Secondary | ICD-10-CM

## 2013-04-01 DIAGNOSIS — F419 Anxiety disorder, unspecified: Secondary | ICD-10-CM

## 2013-04-01 DIAGNOSIS — C50919 Malignant neoplasm of unspecified site of unspecified female breast: Secondary | ICD-10-CM

## 2013-04-01 DIAGNOSIS — Z1501 Genetic susceptibility to malignant neoplasm of breast: Secondary | ICD-10-CM

## 2013-04-01 DIAGNOSIS — F32A Depression, unspecified: Secondary | ICD-10-CM

## 2013-04-01 LAB — COMPREHENSIVE METABOLIC PANEL (CC13)
ALT: 58 U/L — ABNORMAL HIGH (ref 0–55)
ANION GAP: 10 meq/L (ref 3–11)
AST: 129 U/L — ABNORMAL HIGH (ref 5–34)
Albumin: 4.6 g/dL (ref 3.5–5.0)
Alkaline Phosphatase: 79 U/L (ref 40–150)
BILIRUBIN TOTAL: 0.62 mg/dL (ref 0.20–1.20)
BUN: 13.5 mg/dL (ref 7.0–26.0)
CO2: 28 mEq/L (ref 22–29)
CREATININE: 0.9 mg/dL (ref 0.6–1.1)
Calcium: 10.3 mg/dL (ref 8.4–10.4)
Chloride: 104 mEq/L (ref 98–109)
GLUCOSE: 95 mg/dL (ref 70–140)
Potassium: 4.3 mEq/L (ref 3.5–5.1)
Sodium: 141 mEq/L (ref 136–145)
Total Protein: 7 g/dL (ref 6.4–8.3)

## 2013-04-01 LAB — CBC WITH DIFFERENTIAL/PLATELET
BASO%: 0.5 % (ref 0.0–2.0)
Basophils Absolute: 0 10*3/uL (ref 0.0–0.1)
EOS ABS: 0.2 10*3/uL (ref 0.0–0.5)
EOS%: 4.7 % (ref 0.0–7.0)
HEMATOCRIT: 41.1 % (ref 34.8–46.6)
HGB: 13.9 g/dL (ref 11.6–15.9)
LYMPH%: 30.1 % (ref 14.0–49.7)
MCH: 32.6 pg (ref 25.1–34.0)
MCHC: 33.8 g/dL (ref 31.5–36.0)
MCV: 96.3 fL (ref 79.5–101.0)
MONO#: 0.4 10*3/uL (ref 0.1–0.9)
MONO%: 11.1 % (ref 0.0–14.0)
NEUT#: 2 10*3/uL (ref 1.5–6.5)
NEUT%: 53.6 % (ref 38.4–76.8)
PLATELETS: 209 10*3/uL (ref 145–400)
RBC: 4.26 10*6/uL (ref 3.70–5.45)
RDW: 14 % (ref 11.2–14.5)
WBC: 3.7 10*3/uL — AB (ref 3.9–10.3)
lymph#: 1.1 10*3/uL (ref 0.9–3.3)

## 2013-04-01 NOTE — Telephone Encounter (Signed)
, °

## 2013-04-01 NOTE — Progress Notes (Signed)
ID: [name]   DOB: [birth year]  MR#: [id number]  WUJ#:[id number]  PCP: Theadore Nan GYN: Christophe Louis SU: Osborn Coho OTHER MD: [name], Christene Slates, Leach, Crissie Reese  CHIEF COMPLAINT:  Hx of Right Breast Cancer   HISTORY OF PRESENT ILLNESS: Georgina Peer had routine screening mammography at [facility] 11/16/2010 suggesting additional imaging on the right. This was performed the next day, and found no significant residual abnormality. Repeat screening mammography 03/15/2012 again suggested an area of architectural distortion in the right breast. In the same quadrant as before. Additional views 03/19/2012 found a 3 mm hypoechoic mass in the area in question. Biopsy of this mass 03/20/2012 showed an invasive ductal carcinoma, grade 1-2, triple negative, with an MIB-1 of 100%.  Breast MRI 03/23/2012 showed an area of poorly defined enhancement in the right breast associated with clip artifact. This measured 1 cm. In the left breast there was an enhancing mass measuring 2.0 cm. There were no other suspicious findings.   The patient's subsequent history is as detailed below.  INTERVAL HISTORY: Damoni returns alone today for followup of her right breast cancer. Physically she is doing well. She and her husband are still in the process of obtaining a divorce. Her youngest son is at home with her and is "doing okay". Their oldest son is in college in [place]. Florrie herself is still tearful when discussing the situation. She continues on her antidepressants with some benefit, and has also met with a counselor. At this point, she tells me she really just needs to "go through it and deal with it". She does admit that she has been drinking on a daily basis, although she would still consider this to be "social drinking" with her neighbor.  We did note that her liver enzymes were elevated today, and this is detailed below.  Smita completed her implant reconstruction, but is not particularly pleased  with the shape of the breasts. She is scheduled to meet with a surgeon in [place] next week who is doing a "cutting edge" method of reconstruction that involves apparent lipoma suction and a filling of the breast with fat cells.   Claiborne Billings isn't sure she will proceed with this surgery, but is "going to check it out".  Since her appointment here in November, she is also status post TAH/BSO in December which she tolerated well.  REVIEW OF SYSTEMS: Brylynn has had no illnesses and denies any fevers or chills. She continues to have hot flashes, but these have improved some since taking gabapentin at bedtime. Her energy level is good, as is her appetite. She denies any nausea or emesis and has had no change in bowel or bladder habits. She denies any cough, shortness of breath, chest pain, or palpitations. She's had no abnormal headaches or dizziness. She denies any unusual myalgias, arthralgias, bony pain, or peripheral swelling.  Otherwise a detailed review of systems today was noncontributory.   PAST MEDICAL HISTORY: Past Medical History  Diagnosis Date  . PONV (postoperative nausea and vomiting)   . Depression   . Breast cancer 03/20/2012    masectomy/implants  . SVD (spontaneous vaginal delivery)     x 2    PAST SURGICAL HISTORY: Past Surgical History  Procedure Laterality Date  . Anterior cruciate ligament repair  2010  . Endometrial ablation      2012  . Dilation and curettage of uterus    . Basal cell carcinoma excision      OFF NOSE  2011   .  Simple mastectomy w/ sentinel node biopsy Bilateral 06/15/2012    Dr Margot Chimes  . Simple mastectomy with axillary sentinel node biopsy Right 06/14/2012    Procedure: SIMPLE MASTECTOMY WITH AXILLARY SENTINEL NODE BIOPSY;  Surgeon: Haywood Lasso, MD;  Location: [facility];  Service: General;  Laterality: Right;  . Total mastectomy Left 06/14/2012    Procedure: TOTAL MASTECTOMY;  Surgeon: Haywood Lasso, MD;  Location: [facility];  Service: General;   Laterality: Left;  . Breast reconstruction with placement of tissue expander and flex hd (acellular hydrated dermis) Bilateral 06/14/2012    Procedure: BILATERAL BREAST RECONSTRUCTION WITH PLACEMENT OF BILATERAL TISSUE EXPANDERS;  Surgeon: Crissie Reese, MD;  Location: [facility];  Service: Plastics;  Laterality: Bilateral;  . Wisdom tooth extraction    . Eye surgery  2000    bilateral  . Laparoscopic assisted vaginal hysterectomy N/A 01/21/2013    Procedure: LAPAROSCOPIC ASSISTED VAGINAL HYSTERECTOMY;  Surgeon: Maeola Sarah. Landry Mellow, MD;  Location: [facility];  Service: Gynecology;  Laterality: N/A;  . Laparoscopic bilateral salpingo oopherectomy Bilateral 01/21/2013    Procedure: LAPAROSCOPIC BILATERAL SALPINGO OOPHORECTOMY;  Surgeon: Maeola Sarah. Landry Mellow, MD;  Location: [facility];  Service: Gynecology;  Laterality: Bilateral;  Moh's surgery for basal cell, Left nares  FAMILY HISTORY Family History  Problem Relation Age of Onset  . Breast cancer Maternal Aunt     dx in her 21s  . Ovarian cancer Maternal Aunt     dx in her 79s  . Esophageal cancer Maternal Uncle   . Liver cancer Maternal Uncle   . Lung cancer Maternal Uncle   . BRCA 1/2 Maternal Uncle     BRCA 2 +  . Breast cancer Cousin     dx in her 66s; BRCA2+  . Pancreatic cancer Maternal Uncle     dx in his 51s  . BRCA 1/2 Maternal Uncle     BRCA2+  . Colon cancer Maternal Uncle    the patient's parents are alive, in their early 4s. The patient has one brother and one sister. There is a significant family history of cancer and this includes one of the patient's mother is 2 sisters with ovarian and breast cancer. A first cousin of the patient had breast cancer in her 55s. She has been tested for the BRCA gene, but the patient does not know the results. There were also brothers of the patient's mother with esophageal pancreas and colon cancer. The patient has been scheduled for genetic testing.  GYNECOLOGIC HISTORY: (Updated February 2015)   Menarche age 48,  first live birth age 16, she is [place] P2. The patient's periods were irregular, but still ongoing at the time of diagnosis. She did take birth control pills for approximately 10 years with no untoward events.  Status post TAH/BSO in December 2014.  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 Her soon to be ex-husband [name] is a Archivist. He owns his own business. They are currently in the process of divorce. He is originally from [place]. Their children are [name]  who is in his first year of college in [place], and Jason 15 as of October 2014.     ADVANCED DIRECTIVES: Not in place. On 12/31/2012 the patient was given the appropriate papers so she can name a healthcare power of attorney of her choice. If she does get this completed and notarize she will bring [place] a copy to put in her records.  HEALTH MAINTENANCE: History  Substance Use Topics  . Smoking status: Never Smoker   . Smokeless tobacco: Never Used  . Alcohol Use: Yes     Comment: socially     Colonoscopy: Never  PAP:  [facility], Dr. Landry Mellow  Bone density:  Never  Lipid panel:  Followed by Dr. Leonides Schanz   Allergies  Allergen Reactions  . Adhesive [Tape] Rash    Rash  . Penicillins Nausea Only and Rash    Current Outpatient Prescriptions  Medication Sig Dispense Refill  . buPROPion (WELLBUTRIN XL) 300 MG 24 hr tablet Take 300 mg by mouth every morning.       . escitalopram (LEXAPRO) 20 MG tablet TAKE 1 TABLET BY MOUTH EVERY DAY  30 tablet  3  . gabapentin (NEURONTIN) 300 MG capsule Take 1 capsule (300 mg total) by mouth at bedtime.  90 capsule  4  . LORazepam (ATIVAN) 0.5 MG tablet Take 0.5-1 tablets (0.25-0.5 mg total) by mouth 2 (two) times daily as needed (anxiety, insomnia, or nausea).  30 tablet  0  . ibuprofen (ADVIL,MOTRIN) 800 MG tablet Take 1 tablet (800 mg total) by mouth every 8 (eight) hours as needed for moderate pain (mild pain).  30  tablet  2  . oxyCODONE-acetaminophen (PERCOCET/ROXICET) 5-325 MG per tablet Take 1-2 tablets by mouth every 4 (four) hours as needed for severe pain (moderate to severe pain (when tolerating fluids)).  30 tablet  0  . zolpidem (AMBIEN) 5 MG tablet Take 1 tablet (5 mg total) by mouth at bedtime as needed for sleep.  15 tablet  0   No current facility-administered medications for this visit.    OBJECTIVE: Middle-aged white woman who appears comfortable, and is in no acute distress  Filed Vitals:   04/01/13 1059  BP: 127/88  Pulse: 76  Temp: 98.3 F (36.8 C)  Resp: 18     Body mass index is 27.81 kg/(m^2).    ECOG FS: 0 Filed Weights   04/01/13 1059  Weight: 172 lb 3.2 oz (78.109 kg)   Physical Exam: HEENT:  Sclerae anicteric.  Oropharynx clear, pink, and moist. Neck supple, trachea midline. No thyromegaly.  NODES:  No cervical or supraclavicular lymphadenopathy palpated.  BREAST EXAM:  Patient is status post bilateral mastectomies with implant reconstruction bilaterally. Implants are in place, and there is no suspicious nodularity or skin changes noted. Axillae are benign bilaterally, with no palpable lymphadenopathy. LUNGS:  Clear to auscultation bilaterally.  No wheezes or rhonchi HEART:  Regular rate and rhythm. No murmur appreciated full range of motion bilaterally in the upper extremities.  ABDOMEN:  Soft, nontender.  Positive bowel sounds.  MSK:  No focal spinal tenderness to palpation.  EXTREMITIES:  No peripheral edema.  Specifically, no signs of lymphedema. SKIN:  Benign.  No rashes or skin lesions. No excessive ecchymoses or petechiae. No pallor. NEURO:  Nonfocal. Well oriented.  Appropriate affect.    LAB RESULTS: Lab Results  Component Value Date   WBC 3.7* 04/01/2013   NEUTROABS 2.0 04/01/2013   HGB 13.9 04/01/2013   HCT 41.1 04/01/2013   MCV 96.3 04/01/2013   PLT 209 04/01/2013      Chemistry      Component Value Date/Time   NA 141 04/01/2013 1043   NA 140  01/21/2013 0645   K 4.3 04/01/2013 1043   K 4.1 01/21/2013 0645   CL 104 01/21/2013 0645   CL 105 08/07/2012 0808   CO2 28 04/01/2013 1043   CO2  25 01/21/2013 0645   BUN 13.5 04/01/2013 1043   BUN 9 01/21/2013 0645   CREATININE 0.9 04/01/2013 1043   CREATININE 0.73 01/21/2013 0645      Component Value Date/Time   CALCIUM 10.3 04/01/2013 1043   CALCIUM 9.8 01/21/2013 0645   ALKPHOS 79 04/01/2013 1043   ALKPHOS 54 06/07/2012 1053   AST 129* 04/01/2013 1043   AST 27 06/07/2012 1053   ALT 58* 04/01/2013 1043   ALT 16 06/07/2012 1053   BILITOT 0.62 04/01/2013 1043   BILITOT 0.3 06/07/2012 1053       Lab Results  Component Value Date   LABCA2 23 03/28/2012     STUDIES: No results found.   ASSESSMENT: 48 y.o. BRCA-2 positive [place]s woman   (1)  status post right breast biopsy 03/20/2012 for a clinical T1b N0, stage IA invasive ductal carcinoma, grade 1-2, triple negative, with an MIB-1 of 100%  (2) biopsy of a suspicious left breast mass was benign  (3) status post bilateral mastectomies with right sentinel lymph node sampling and immediate expander placement 06/14/2012, for a right-sided pT1c pN0, stage IA invasive ductal carcinoma, grade 1, with ample margins, triple negative,  with an MIB-1 of 100%. (The left breast was benign)  (4) completed 4 dose dense cycles of doxorubicin/ cyclophosphamide followed by 4 dose dense cycles of paclitaxel completed 11/05/2012  (5) BRCA2 positivity: The patient is status post bilateral mastectomies and is  also status post  hysterectomy and bilateral salpingo-oophorectomy 01/21/2013 under  the care ofDr. Landry Mellow  PLAN:  Claiborne Billings  appears to be doing well with regards to her breast cancer, with no clinical evidence of disease recurrence. I am a little concerned about her elevated liver enzymes today, and we will recheck these labs in approximately 2 weeks. We did discuss the fact that she has been drinking on a daily basis, and I suggested that she decrease her  alcohol intake. I also suggested that she consider additional counseling if necessary. She voices both her understanding and agreement.  Otherwise, we will continue to follow Claiborne Billings closely with observation, and she'll return in 3 months for routine labs and physical exam.  She of course knows to call prior that time with any changes or problems that might occur.    Wahneta Derocher PA-C    04/01/2013

## 2013-04-01 NOTE — Progress Notes (Signed)
Pt came in today seeing Norma [name] Found and stated that she is going to [facility] for a consultation and needed to request records.  I receive the request from [facility] and printed what the patient needed.  Gave paperwork to pt.  Took ROI to Med [facility] to scan.

## 2013-04-12 ENCOUNTER — Telehealth: Payer: Self-pay | Admitting: *Deleted

## 2013-04-12 NOTE — Telephone Encounter (Signed)
Pt called stating that she will be out of town on 04/16/13. She rs her lab to 05/06/13 @ 9am. Pt is aware...td

## 2013-04-16 ENCOUNTER — Other Ambulatory Visit: Payer: BC Managed Care – PPO

## 2013-05-06 ENCOUNTER — Other Ambulatory Visit: Payer: Self-pay | Admitting: Physician Assistant

## 2013-05-06 ENCOUNTER — Telehealth: Payer: Self-pay | Admitting: *Deleted

## 2013-05-06 ENCOUNTER — Other Ambulatory Visit (HOSPITAL_BASED_OUTPATIENT_CLINIC_OR_DEPARTMENT_OTHER): Payer: BC Managed Care – PPO

## 2013-05-06 DIAGNOSIS — C50419 Malignant neoplasm of upper-outer quadrant of unspecified female breast: Secondary | ICD-10-CM

## 2013-05-06 DIAGNOSIS — R748 Abnormal levels of other serum enzymes: Secondary | ICD-10-CM

## 2013-05-06 DIAGNOSIS — D649 Anemia, unspecified: Secondary | ICD-10-CM

## 2013-05-06 DIAGNOSIS — C50919 Malignant neoplasm of unspecified site of unspecified female breast: Secondary | ICD-10-CM

## 2013-05-06 LAB — COMPREHENSIVE METABOLIC PANEL (CC13)
ALT: 25 U/L (ref 0–55)
ANION GAP: 12 meq/L — AB (ref 3–11)
AST: 23 U/L (ref 5–34)
Albumin: 3.7 g/dL (ref 3.5–5.0)
Alkaline Phosphatase: 73 U/L (ref 40–150)
BUN: 6.2 mg/dL — ABNORMAL LOW (ref 7.0–26.0)
CALCIUM: 9.9 mg/dL (ref 8.4–10.4)
CO2: 26 meq/L (ref 22–29)
CREATININE: 0.8 mg/dL (ref 0.6–1.1)
Chloride: 103 mEq/L (ref 98–109)
GLUCOSE: 109 mg/dL (ref 70–140)
Potassium: 4.4 mEq/L (ref 3.5–5.1)
Sodium: 140 mEq/L (ref 136–145)
TOTAL PROTEIN: 6.5 g/dL (ref 6.4–8.3)
Total Bilirubin: 0.68 mg/dL (ref 0.20–1.20)

## 2013-05-06 LAB — CBC WITH DIFFERENTIAL/PLATELET
BASO%: 0.6 % (ref 0.0–2.0)
Basophils Absolute: 0 10*3/uL (ref 0.0–0.1)
EOS ABS: 0.1 10*3/uL (ref 0.0–0.5)
EOS%: 2.2 % (ref 0.0–7.0)
HEMATOCRIT: 28.2 % — AB (ref 34.8–46.6)
HGB: 9.6 g/dL — ABNORMAL LOW (ref 11.6–15.9)
LYMPH%: 17.7 % (ref 14.0–49.7)
MCH: 33.3 pg (ref 25.1–34.0)
MCHC: 34.1 g/dL (ref 31.5–36.0)
MCV: 97.9 fL (ref 79.5–101.0)
MONO#: 0.5 10*3/uL (ref 0.1–0.9)
MONO%: 9.4 % (ref 0.0–14.0)
NEUT%: 70.1 % (ref 38.4–76.8)
NEUTROS ABS: 3.8 10*3/uL (ref 1.5–6.5)
Platelets: 381 10*3/uL (ref 145–400)
RBC: 2.89 10*6/uL — ABNORMAL LOW (ref 3.70–5.45)
RDW: 13.5 % (ref 11.2–14.5)
WBC: 5.5 10*3/uL (ref 3.9–10.3)
lymph#: 1 10*3/uL (ref 0.9–3.3)

## 2013-05-06 NOTE — Telephone Encounter (Signed)
Per PA-C request.  Called pt to inform her of  CBC results. Pt was pleased that liver enzymes are back to normal. Discussed with pt about Hgb(9.6 L). Pt told me she recently had reconstructive surgery on March 11th in [place], [place] with Dr. Allena Katz. We discussed the recent surgery would probably explain the low hemoglobin. I told her we will repeat labs in 3 to 4 weeks and our scheduler will call her with appt. Message to be forwarded to [employer], PA-C.

## 2013-05-07 ENCOUNTER — Telehealth: Payer: Self-pay | Admitting: *Deleted

## 2013-05-07 NOTE — Telephone Encounter (Signed)
Per 3/23 POF I have aschedued appt for 4/13. Patient called with appt. Patient will call back to get time/date again later.  JMW

## 2013-05-09 ENCOUNTER — Telehealth: Payer: Self-pay | Admitting: Oncology

## 2013-05-09 NOTE — Telephone Encounter (Signed)
s.w. pt and r/s appt to diff d/t....pt aware of new dt

## 2013-05-27 ENCOUNTER — Other Ambulatory Visit: Payer: BC Managed Care – PPO

## 2013-05-27 ENCOUNTER — Telehealth: Payer: Self-pay | Admitting: Oncology

## 2013-05-27 NOTE — Telephone Encounter (Signed)
, °

## 2013-05-30 ENCOUNTER — Other Ambulatory Visit: Payer: BC Managed Care – PPO

## 2013-06-14 ENCOUNTER — Other Ambulatory Visit: Payer: Self-pay | Admitting: Plastic Surgery

## 2013-06-14 ENCOUNTER — Encounter (HOSPITAL_BASED_OUTPATIENT_CLINIC_OR_DEPARTMENT_OTHER): Payer: Self-pay | Admitting: *Deleted

## 2013-06-14 DIAGNOSIS — S21009A Unspecified open wound of unspecified breast, initial encounter: Secondary | ICD-10-CM

## 2013-06-14 NOTE — H&P (Signed)
Claudeen Leason is an 48 y.o. female.   Chief Complaint: Left breast wound HPI: The patient is a 48 yrs old female here for treatment of a left breast wound. She had a routine mammography (10//2012) with abnormalities on the right. Further views were done the following day and found to have no significant residual abnormality. Repeat mammography (02/2012) suggested an area of architectural distortion in the same quadrant of the right breast. Additional views (03/2012) showed a hypoechoic mass. The biopsy (03/2012) showed an invasive ductal carcinoma, grade 1-2, triple negative, with an MIB-1 of 100%. The Breast MRI (03/2012) showed an area of poorly defined enhancement in the right breast associated with clip artifact. She was found to be BRCA positive. The left breast showed an enhancing mass. She underwent bilateral mastectomies with initial reconstruction with implants by Drs. Streck and [employer]. She then went to Dr. Dorien Chihuahua in [place] [phone]) and had capsulectomies, implant removal of 800 cc and placement of silicone implants (left 320 cc, right 245 cc) and a reverse abdominal fasciocutaneous flap for reconstruction.  Her past medical history is positive for breast cancer and depression. Surgical history includes cruciate ligament repair, endometrial ablation, D & C, hysterectomy, bilateral mastectomies, basal cell carcinoma of nose, breast reconstruction with expanders/FlexHD. Her family history is positive for cancer of the breast, ovaries, esophageal cancer, liver, lung, colon and pancreas cancer. She has two children.  She is 5 feet 6 inches tall, weighs 165 pounds and was a 38C bra. The wound is at the inframammary fold of the left breast and is 7 x 2.5 x 2.5 cm with good granulation tissue at the base. There is no sign of infection. She has been using dakins solution on the area daily.  Past Medical History  Diagnosis Date  . PONV (postoperative nausea and vomiting)   . Depression   .  Breast cancer 03/20/2012    masectomy/implants  . SVD (spontaneous vaginal delivery)     x 2    Past Surgical History  Procedure Laterality Date  . Anterior cruciate ligament repair  2010  . Endometrial ablation      2012  . Dilation and curettage of uterus    . Basal cell carcinoma excision      OFF NOSE  2011   . Simple mastectomy w/ sentinel node biopsy Bilateral 06/15/2012    Dr Margot Chimes  . Simple mastectomy with axillary sentinel node biopsy Right 06/14/2012    Procedure: SIMPLE MASTECTOMY WITH AXILLARY SENTINEL NODE BIOPSY;  Surgeon: Haywood Lasso, MD;  Location: [facility];  Service: General;  Laterality: Right;  . Total mastectomy Left 06/14/2012    Procedure: TOTAL MASTECTOMY;  Surgeon: Haywood Lasso, MD;  Location: [facility];  Service: General;  Laterality: Left;  . Breast reconstruction with placement of tissue expander and flex hd (acellular hydrated dermis) Bilateral 06/14/2012    Procedure: BILATERAL BREAST RECONSTRUCTION WITH PLACEMENT OF BILATERAL TISSUE EXPANDERS;  Surgeon: Crissie Reese, MD;  Location: [facility];  Service: Plastics;  Laterality: Bilateral;  . Wisdom tooth extraction    . Eye surgery  2000    bilateral  . Laparoscopic assisted vaginal hysterectomy N/A 01/21/2013    Procedure: LAPAROSCOPIC ASSISTED VAGINAL HYSTERECTOMY;  Surgeon: Maeola Sarah. Landry Mellow, MD;  Location: [facility];  Service: Gynecology;  Laterality: N/A;  . Laparoscopic bilateral salpingo oopherectomy Bilateral 01/21/2013    Procedure: LAPAROSCOPIC BILATERAL SALPINGO OOPHORECTOMY;  Surgeon: Maeola Sarah. Landry Mellow, MD;  Location: [facility];  Service: Gynecology;  Laterality: Bilateral;  Family History  Problem Relation Age of Onset  . Breast cancer Maternal Aunt     dx in her 83s  . Ovarian cancer Maternal Aunt     dx in her 13s  . Esophageal cancer Maternal Uncle   . Liver cancer Maternal Uncle   . Lung cancer Maternal Uncle   . BRCA 1/2 Maternal Uncle     BRCA 2 +  . Breast cancer Cousin     dx in her 6s; BRCA2+  .  Pancreatic cancer Maternal Uncle     dx in his 35s  . BRCA 1/2 Maternal Uncle     BRCA2+  . Colon cancer Maternal Uncle    Social History:  reports that she has never smoked. She has never used smokeless tobacco. She reports that she drinks alcohol. She reports that she does not use illicit drugs.  Allergies:  Allergies  Allergen Reactions  . Adhesive [Tape] Rash    Rash  . Penicillins Nausea Only and Rash     (Not in a hospital admission)  No results found for this or any previous visit (from the past 48 hour(s)). No results found.  Review of Systems  Constitutional: Negative.   HENT: Negative.   Eyes: Negative.   Respiratory: Negative.   Cardiovascular: Negative.   Gastrointestinal: Negative.   Genitourinary: Negative.   Musculoskeletal: Negative.   Skin: Negative.   Neurological: Negative.   Psychiatric/Behavioral: Negative.     There were no vitals taken for this visit. Physical Exam  Constitutional: She is oriented to person, place, and time. She appears well-developed and well-nourished.  HENT:  Head: Normocephalic and atraumatic.  Eyes: Conjunctivae and EOM are normal. Pupils are equal, round, and reactive to light.  Cardiovascular: Normal rate.   Respiratory:    GI: Soft.  Musculoskeletal: Normal range of motion.  Neurological: She is alert and oriented to person, place, and time.  Skin: Skin is warm.  Psychiatric: She has a normal mood and affect. Her behavior is normal. Judgment and thought content normal.     Assessment/Plan Plan for irrigation and debridement of the left breast wound with Acell and VAC placement.  Earnest Thalman Sanger 06/14/2013, 6:48 AM

## 2013-06-14 NOTE — Progress Notes (Signed)
No new labs needed 

## 2013-06-17 ENCOUNTER — Ambulatory Visit (HOSPITAL_BASED_OUTPATIENT_CLINIC_OR_DEPARTMENT_OTHER): Payer: BC Managed Care – PPO

## 2013-06-19 ENCOUNTER — Ambulatory Visit (HOSPITAL_BASED_OUTPATIENT_CLINIC_OR_DEPARTMENT_OTHER): Payer: BC Managed Care – PPO | Admitting: Anesthesiology

## 2013-06-19 ENCOUNTER — Encounter (HOSPITAL_BASED_OUTPATIENT_CLINIC_OR_DEPARTMENT_OTHER): Payer: BC Managed Care – PPO | Admitting: Anesthesiology

## 2013-06-19 ENCOUNTER — Encounter (HOSPITAL_BASED_OUTPATIENT_CLINIC_OR_DEPARTMENT_OTHER)
Admission: RE | Disposition: A | Discharge status: Home / Self Care | Payer: Self-pay | Source: Ambulatory Visit | Attending: Plastic Surgery

## 2013-06-19 ENCOUNTER — Encounter (HOSPITAL_BASED_OUTPATIENT_CLINIC_OR_DEPARTMENT_OTHER): Payer: Self-pay | Admitting: Anesthesiology

## 2013-06-19 ENCOUNTER — Ambulatory Visit (HOSPITAL_BASED_OUTPATIENT_CLINIC_OR_DEPARTMENT_OTHER)
Admission: RE | Admit: 2013-06-19 | Discharge: 2013-06-19 | Disposition: A | Discharge status: Home / Self Care | Payer: BC Managed Care – PPO | Source: Ambulatory Visit | Attending: Plastic Surgery | Admitting: Plastic Surgery

## 2013-06-19 DIAGNOSIS — Z8041 Family history of malignant neoplasm of ovary: Secondary | ICD-10-CM | POA: Insufficient documentation

## 2013-06-19 DIAGNOSIS — Z853 Personal history of malignant neoplasm of breast: Secondary | ICD-10-CM | POA: Insufficient documentation

## 2013-06-19 DIAGNOSIS — Z85828 Personal history of other malignant neoplasm of skin: Secondary | ICD-10-CM | POA: Insufficient documentation

## 2013-06-19 DIAGNOSIS — Z9010 Acquired absence of unspecified breast and nipple: Secondary | ICD-10-CM | POA: Insufficient documentation

## 2013-06-19 DIAGNOSIS — Z800 Family history of malignant neoplasm of digestive organs: Secondary | ICD-10-CM | POA: Insufficient documentation

## 2013-06-19 DIAGNOSIS — Z90710 Acquired absence of both cervix and uterus: Secondary | ICD-10-CM | POA: Insufficient documentation

## 2013-06-19 DIAGNOSIS — Z801 Family history of malignant neoplasm of trachea, bronchus and lung: Secondary | ICD-10-CM | POA: Insufficient documentation

## 2013-06-19 DIAGNOSIS — Y831 Surgical operation with implant of artificial internal device as the cause of abnormal reaction of the patient, or of later complication, without mention of misadventure at the time of the procedure: Secondary | ICD-10-CM | POA: Insufficient documentation

## 2013-06-19 DIAGNOSIS — N611 Abscess of the breast and nipple: Secondary | ICD-10-CM

## 2013-06-19 DIAGNOSIS — F3289 Other specified depressive episodes: Secondary | ICD-10-CM | POA: Insufficient documentation

## 2013-06-19 DIAGNOSIS — Z803 Family history of malignant neoplasm of breast: Secondary | ICD-10-CM | POA: Insufficient documentation

## 2013-06-19 DIAGNOSIS — F329 Major depressive disorder, single episode, unspecified: Secondary | ICD-10-CM | POA: Insufficient documentation

## 2013-06-19 DIAGNOSIS — S21009A Unspecified open wound of unspecified breast, initial encounter: Secondary | ICD-10-CM

## 2013-06-19 DIAGNOSIS — L98499 Non-pressure chronic ulcer of skin of other sites with unspecified severity: Secondary | ICD-10-CM

## 2013-06-19 DIAGNOSIS — T8189XA Other complications of procedures, not elsewhere classified, initial encounter: Secondary | ICD-10-CM | POA: Insufficient documentation

## 2013-06-19 HISTORY — PX: INCISION AND DRAINAGE OF WOUND: SHX1803

## 2013-06-19 LAB — POCT HEMOGLOBIN-HEMACUE: HEMOGLOBIN: 13 g/dL (ref 12.0–15.0)

## 2013-06-19 SURGERY — IRRIGATION AND DEBRIDEMENT WOUND
Anesthesia: General | Site: Breast | Laterality: Left

## 2013-06-19 MED ORDER — SCOPOLAMINE 1 MG/3DAYS TD PT72
1.0000 | MEDICATED_PATCH | TRANSDERMAL | Status: DC
  Administered 2013-06-19: 1.5 mg via TRANSDERMAL

## 2013-06-19 MED ORDER — FENTANYL CITRATE 0.05 MG/ML IJ SOLN
INTRAMUSCULAR | Status: AC
  Filled 2013-06-19: qty 4

## 2013-06-19 MED ORDER — LACTATED RINGERS IV SOLN
INTRAVENOUS | Status: DC
  Administered 2013-06-19 (×2): via INTRAVENOUS

## 2013-06-19 MED ORDER — DEXAMETHASONE SODIUM PHOSPHATE 4 MG/ML IJ SOLN
INTRAMUSCULAR | Status: DC | PRN
  Administered 2013-06-19: 10 mg via INTRAVENOUS

## 2013-06-19 MED ORDER — MIDAZOLAM HCL 2 MG/2ML IJ SOLN: 0.5000 mg | Freq: Once | INTRAMUSCULAR | Status: DC | PRN

## 2013-06-19 MED ORDER — SCOPOLAMINE 1 MG/3DAYS TD PT72
MEDICATED_PATCH | TRANSDERMAL | Status: AC
  Filled 2013-06-19: qty 1

## 2013-06-19 MED ORDER — CIPROFLOXACIN IN D5W 400 MG/200ML IV SOLN
INTRAVENOUS | Status: DC | PRN
  Administered 2013-06-19: 400 mg via INTRAVENOUS

## 2013-06-19 MED ORDER — CIPROFLOXACIN IN D5W 400 MG/200ML IV SOLN
400.0000 mg | INTRAVENOUS | Status: AC
  Administered 2013-06-19: 400 mg via INTRAVENOUS

## 2013-06-19 MED ORDER — ONDANSETRON HCL 4 MG/2ML IJ SOLN
INTRAMUSCULAR | Status: DC | PRN
  Administered 2013-06-19: 4 mg via INTRAVENOUS

## 2013-06-19 MED ORDER — LIDOCAINE-EPINEPHRINE 1 %-1:100000 IJ SOLN
INTRAMUSCULAR | Status: AC
  Filled 2013-06-19: qty 1

## 2013-06-19 MED ORDER — MIDAZOLAM HCL 5 MG/5ML IJ SOLN
INTRAMUSCULAR | Status: DC | PRN
  Administered 2013-06-19: 2 mg via INTRAVENOUS

## 2013-06-19 MED ORDER — PROPOFOL 10 MG/ML IV BOLUS
INTRAVENOUS | Status: DC | PRN
  Administered 2013-06-19: 150 mg via INTRAVENOUS

## 2013-06-19 MED ORDER — MIDAZOLAM HCL 2 MG/2ML IJ SOLN: 1.0000 mg | INTRAMUSCULAR | Status: DC | PRN

## 2013-06-19 MED ORDER — MEPERIDINE HCL 25 MG/ML IJ SOLN: 6.2500 mg | INTRAMUSCULAR | Status: DC | PRN

## 2013-06-19 MED ORDER — CIPROFLOXACIN IN D5W 400 MG/200ML IV SOLN
INTRAVENOUS | Status: AC
  Filled 2013-06-19: qty 200

## 2013-06-19 MED ORDER — FENTANYL CITRATE 0.05 MG/ML IJ SOLN
INTRAMUSCULAR | Status: DC | PRN
  Administered 2013-06-19 (×3): 25 ug via INTRAVENOUS
  Administered 2013-06-19: 100 ug via INTRAVENOUS

## 2013-06-19 MED ORDER — MIDAZOLAM HCL 2 MG/2ML IJ SOLN
INTRAMUSCULAR | Status: AC
  Filled 2013-06-19: qty 2

## 2013-06-19 MED ORDER — LIDOCAINE HCL (CARDIAC) 20 MG/ML IV SOLN
INTRAVENOUS | Status: DC | PRN
  Administered 2013-06-19: 50 mg via INTRAVENOUS

## 2013-06-19 MED ORDER — OXYCODONE HCL 5 MG/5ML PO SOLN: 5.0000 mg | Freq: Once | ORAL | Status: DC | PRN

## 2013-06-19 MED ORDER — BUPIVACAINE-EPINEPHRINE (PF) 0.25% -1:200000 IJ SOLN
INTRAMUSCULAR | Status: AC
  Filled 2013-06-19: qty 30

## 2013-06-19 MED ORDER — HYDROMORPHONE HCL PF 1 MG/ML IJ SOLN: 0.2500 mg | INTRAMUSCULAR | Status: DC | PRN

## 2013-06-19 MED ORDER — FENTANYL CITRATE 0.05 MG/ML IJ SOLN: 50.0000 ug | INTRAMUSCULAR | Status: DC | PRN

## 2013-06-19 MED ORDER — OXYCODONE HCL 5 MG PO TABS: 5.0000 mg | ORAL_TABLET | Freq: Once | ORAL | Status: DC | PRN

## 2013-06-19 MED ORDER — PROMETHAZINE HCL 25 MG/ML IJ SOLN: 6.2500 mg | INTRAMUSCULAR | Status: DC | PRN

## 2013-06-19 MED ORDER — GENTAMICIN SULFATE 40 MG/ML IJ SOLN
INTRAMUSCULAR | Status: DC | PRN
  Administered 2013-06-19: 11:00:00

## 2013-06-19 SURGICAL SUPPLY — 69 items
APL SKNCLS STERI-STRIP NONHPOA (GAUZE/BANDAGES/DRESSINGS)
BAG DECANTER FOR FLEXI CONT (MISCELLANEOUS) IMPLANT
BENZOIN TINCTURE PRP APPL 2/3 (GAUZE/BANDAGES/DRESSINGS) IMPLANT
BLADE 10 SAFETY STRL DISP (BLADE) IMPLANT
BLADE 15 SAFETY STRL DISP (BLADE) ×1 IMPLANT
BLADE HEX COATED 2.75 (ELECTRODE) ×2 IMPLANT
CANISTER SUCT 1200ML W/VALVE (MISCELLANEOUS) IMPLANT
CANISTER SUCT 3000ML (MISCELLANEOUS) IMPLANT
CHLORAPREP W/TINT 26ML (MISCELLANEOUS) IMPLANT
CLOSURE WOUND 1/2 X4 (GAUZE/BANDAGES/DRESSINGS)
COVER MAYO STAND STRL (DRAPES) ×3 IMPLANT
COVER TABLE BACK 60X90 (DRAPES) ×3 IMPLANT
DECANTER SPIKE VIAL GLASS SM (MISCELLANEOUS) IMPLANT
DRAIN CHANNEL 19F RND (DRAIN) IMPLANT
DRAIN PENROSE 1/2X12 LTX STRL (WOUND CARE) IMPLANT
DRAPE INCISE IOBAN 66X45 STRL (DRAPES) IMPLANT
DRAPE LAPAROSCOPIC ABDOMINAL (DRAPES) ×2 IMPLANT
DRAPE PED LAPAROTOMY (DRAPES) IMPLANT
DRSG ADAPTIC 3X8 NADH LF (GAUZE/BANDAGES/DRESSINGS) ×2 IMPLANT
DRSG EMULSION OIL 3X3 NADH (GAUZE/BANDAGES/DRESSINGS) IMPLANT
DRSG PAD ABDOMINAL 8X10 ST (GAUZE/BANDAGES/DRESSINGS) IMPLANT
ELECT REM PT RETURN 9FT ADLT (ELECTROSURGICAL) ×3
ELECTRODE REM PT RTRN 9FT ADLT (ELECTROSURGICAL) ×1 IMPLANT
EVACUATOR SILICONE 100CC (DRAIN) IMPLANT
GAUZE SPONGE 4X4 12PLY STRL (GAUZE/BANDAGES/DRESSINGS) ×3 IMPLANT
GAUZE XEROFORM 1X8 LF (GAUZE/BANDAGES/DRESSINGS) IMPLANT
GAUZE XEROFORM 5X9 LF (GAUZE/BANDAGES/DRESSINGS) IMPLANT
GLOVE BIO SURGEON STRL SZ 6.5 (GLOVE) ×4 IMPLANT
GLOVE BIO SURGEONS STRL SZ 6.5 (GLOVE) ×2
GOWN STRL REUS W/ TWL LRG LVL3 (GOWN DISPOSABLE) ×2 IMPLANT
GOWN STRL REUS W/TWL LRG LVL3 (GOWN DISPOSABLE) ×9
HANDPIECE INTERPULSE COAX TIP (DISPOSABLE)
IV NS IRRIG 3000ML ARTHROMATIC (IV SOLUTION) IMPLANT
MANIFOLD NEPTUNE II (INSTRUMENTS) IMPLANT
MATRIX SURGICAL PSM 7X10CM (Tissue) ×2 IMPLANT
MICROMATRIX 1000MG (Tissue) ×3 IMPLANT
NEEDLE 27GAX1X1/2 (NEEDLE) IMPLANT
NS IRRIG 1000ML POUR BTL (IV SOLUTION) ×3 IMPLANT
PACK BASIN DAY SURGERY FS (CUSTOM PROCEDURE TRAY) ×3 IMPLANT
PENCIL BUTTON HOLSTER BLD 10FT (ELECTRODE) ×3 IMPLANT
PIN SAFETY STERILE (MISCELLANEOUS) IMPLANT
SET HNDPC FAN SPRY TIP SCT (DISPOSABLE) IMPLANT
SHEET MEDIUM DRAPE 40X70 STRL (DRAPES) IMPLANT
SLEEVE SCD COMPRESS KNEE MED (MISCELLANEOUS) ×2 IMPLANT
SOLUTION PARTIC MCRMTRX 1000MG (Tissue) IMPLANT
SPONGE GAUZE 4X4 12PLY STER LF (GAUZE/BANDAGES/DRESSINGS) IMPLANT
SPONGE LAP 18X18 X RAY DECT (DISPOSABLE) ×4 IMPLANT
SPONGE LAP 4X18 X RAY DECT (DISPOSABLE) IMPLANT
STAPLER VISISTAT 35W (STAPLE) IMPLANT
STRIP CLOSURE SKIN 1/2X4 (GAUZE/BANDAGES/DRESSINGS) IMPLANT
SUCTION FRAZIER TIP 10 FR DISP (SUCTIONS) IMPLANT
SURGILUBE 2OZ TUBE FLIPTOP (MISCELLANEOUS) ×2 IMPLANT
SUT MNCRL AB 4-0 PS2 18 (SUTURE) ×3 IMPLANT
SUT SILK 3 0 PS 1 (SUTURE) IMPLANT
SUT VIC AB 3-0 FS2 27 (SUTURE) IMPLANT
SUT VIC AB 5-0 PS2 18 (SUTURE) IMPLANT
SUT VICRYL 4-0 PS2 18IN ABS (SUTURE) IMPLANT
SWAB COLLECTION DEVICE MRSA (MISCELLANEOUS) IMPLANT
SYR BULB IRRIGATION 50ML (SYRINGE) ×2 IMPLANT
SYR CONTROL 10ML LL (SYRINGE) IMPLANT
TAPE HYPAFIX 6 X30' (GAUZE/BANDAGES/DRESSINGS)
TAPE HYPAFIX 6X30 (GAUZE/BANDAGES/DRESSINGS) IMPLANT
TOWEL OR 17X24 6PK STRL BLUE (TOWEL DISPOSABLE) ×5 IMPLANT
TRAY DSU PREP LF (CUSTOM PROCEDURE TRAY) ×2 IMPLANT
TUBE ANAEROBIC SPECIMEN COL (MISCELLANEOUS) IMPLANT
TUBE CONNECTING 20'X1/4 (TUBING) ×1
TUBE CONNECTING 20X1/4 (TUBING) ×1 IMPLANT
UNDERPAD 30X30 INCONTINENT (UNDERPADS AND DIAPERS) ×2 IMPLANT
YANKAUER SUCT BULB TIP NO VENT (SUCTIONS) ×2 IMPLANT

## 2013-06-19 NOTE — Op Note (Signed)
Operative Note   DATE OF OPERATION: 06/19/2013  LOCATION: [facility]  SURGICAL DIVISION: Plastic Surgery  PREOPERATIVE DIAGNOSES:  Left Breast Ulcer (2 x 7 x 1.5 cm)  POSTOPERATIVE DIAGNOSES:  same  PROCEDURE:   Preparation of left breast ulcer for placement of Acell (7 x 10 sheet and 1 gm powder) and the VAC Partial closure of wound by 3 cm  SURGEON: Theodoro Kos, DO  ASSISTANT: Shawn Rayburn, PA  ANESTHESIA:  General.   COMPLICATIONS: None.   INDICATIONS FOR PROCEDURE:  The patient, Norma [name], is a 48 y.o. female born on 1965-07-01, is here for treatment of a left breast ulcer after she underwent reconstruction for treatment of breast cancer.   CONSENT:  Informed consent was obtained directly from the patient. Risks, benefits and alternatives were fully discussed. Specific risks including but not limited to bleeding, infection, hematoma, seroma, scarring, pain, implant infection, implant extrusion, capsular contracture, asymmetry, wound healing problems, and need for further surgery were all discussed. The patient did have an ample opportunity to have questions answered to satisfaction.   DESCRIPTION OF PROCEDURE:  The patient was taken to the operating room. SCDs were placed and IV antibiotics were given. The patient's operative site was prepped and draped in a sterile fashion. A time out was performed and all information was confirmed to be correct.  General anesthesia was administered.  The area was irrigated with antibiotic solution.  The skin, subcutaneous and fibrous tissue was debrided.  One cm of undermining was done on the superior and inferior portion of the wound.  Hemostasis was achieved with electrocautery.  The 4-0 Monocryl was used to close the lateral and medial aspect of the wound with a vertical mattress suture.  The Acell powder and sheet were applied.  The adaptic was placed with surgical lube over it.  The VAC was applied and had an excellent  seal and was set at 125 mmHg pressure. The patient tolerated the procedure well.  There were no complications. The patient was allowed to wake from anesthesia, extubated and taken to the recovery room in satisfactory condition.

## 2013-06-19 NOTE — Transfer of Care (Signed)
Immediate Anesthesia Transfer of Care Note  Patient: Norma [name]  Procedure(s) Performed: Procedure(s): LEFT IRRIGATION AND DEBRIDEMENT WITH ACCELL/VAC PLACEMENT (Left)  Patient Location: PACU  Anesthesia Type:General  Level of Consciousness: awake, alert , oriented and patient cooperative  Airway & Oxygen Therapy: Patient Spontanous Breathing and Patient connected to face mask oxygen  Post-op Assessment: Report given to PACU RN and Post -op Vital signs reviewed and stable  Post vital signs: Reviewed and stable  Complications: No apparent anesthesia complications

## 2013-06-19 NOTE — Brief Op Note (Signed)
06/19/2013  10:47 AM  PATIENT:  Norma [name]  48 y.o. female  PRE-OPERATIVE DIAGNOSIS:  Left Open wound of breast, without mention of complication  POST-OPERATIVE DIAGNOSIS:  Left Open wound of breast, without mention of complication  PROCEDURE:  Procedure(s): LEFT IRRIGATION AND DEBRIDEMENT WITH ACCELL/VAC PLACEMENT (Left)  SURGEON:  Surgeon(s) and Role:    * Claire Sanger, DO - Primary  PHYSICIAN ASSISTANT: Shawn Rayburn, PA  ASSISTANTS: none   ANESTHESIA:   general  EBL:  Total I/O In: 1000 [I.V.:1000] Out: -   BLOOD ADMINISTERED:none  DRAINS: none   LOCAL MEDICATIONS USED:  NONE  SPECIMEN:  No Specimen  DISPOSITION OF SPECIMEN:  N/A  COUNTS:  YES  TOURNIQUET:  * No tourniquets in log *  DICTATION: .Dragon Dictation  PLAN OF CARE: Discharge to home after PACU  PATIENT DISPOSITION:  PACU - hemodynamically stable.   Delay start of Pharmacological VTE agent (>24hrs) due to surgical blood loss or risk of bleeding: no

## 2013-06-19 NOTE — Discharge Instructions (Signed)
Keep VAC at 125 mmHg continuous pressure. Keep dressing dry until seen for follow-up.  Call your surgeon if you experience:   1.  Fever over 101.0. 2.  Inability to urinate. 3.  Nausea and/or vomiting. 4.  Extreme swelling or bruising at the surgical site. 5.  Continued bleeding from the incision. 6.  Increased pain, redness or drainage from the incision. 7.  Problems related to your pain medication.   Post Anesthesia Home Care Instructions  Activity: Get plenty of rest for the remainder of the day. A responsible adult should stay with you for 24 hours following the procedure.  For the next 24 hours, DO NOT: -Drive a car -Paediatric nurse -Drink alcoholic beverages -Take any medication unless instructed by your physician -Make any legal decisions or sign important papers.  Meals: Start with liquid foods such as gelatin or soup. Progress to regular foods as tolerated. Avoid greasy, spicy, heavy foods. If nausea and/or vomiting occur, drink only clear liquids until the nausea and/or vomiting subsides. Call your physician if vomiting continues.  Special Instructions/Symptoms: Your throat may feel dry or sore from the anesthesia or the breathing tube placed in your throat during surgery. If this causes discomfort, gargle with warm salt water. The discomfort should disappear within 24 hours.

## 2013-06-19 NOTE — Anesthesia Preprocedure Evaluation (Addendum)
Anesthesia Evaluation  Patient identified by MRN, date of birth, ID band Patient awake    Reviewed: Allergy & Precautions, H&P , NPO status , Patient's Chart, lab work & pertinent test results  History of Anesthesia Complications (+) PONV and history of anesthetic complications  Airway Mallampati: II TM Distance: >3 FB Neck ROM: Full    Dental  (+) Teeth Intact, Dental Advisory Given   Pulmonary neg pulmonary ROS,  breath sounds clear to auscultation  Pulmonary exam normal       Cardiovascular negative cardio ROS  Rhythm:Regular Rate:Normal  '14 ECHO: normal LVF, EF 60%, valves OK   Neuro/Psych negative neurological ROS     GI/Hepatic negative GI ROS, Neg liver ROS,   Endo/Other  negative endocrine ROS  Renal/GU negative Renal ROS     Musculoskeletal   Abdominal   Peds  Hematology  (+) Blood dyscrasia (h/o chemo), ,   Anesthesia Other Findings Breast cancer: surgery, chemo  Reproductive/Obstetrics                          Anesthesia Physical Anesthesia Plan  ASA: II  Anesthesia Plan: General   Post-op Pain Management:    Induction: Intravenous  Airway Management Planned: LMA  Additional Equipment:   Intra-op Plan:   Post-operative Plan:   Informed Consent: I have reviewed the patients History and Physical, chart, labs and discussed the procedure including the risks, benefits and alternatives for the proposed anesthesia with the patient or authorized representative who has indicated his/her understanding and acceptance.   Dental advisory given  Plan Discussed with: CRNA and Surgeon  Anesthesia Plan Comments: (Plan routine monitors, GA- LMA OK)        Anesthesia Quick Evaluation

## 2013-06-19 NOTE — Anesthesia Procedure Notes (Signed)
Procedure Name: LMA Insertion Date/Time: 06/19/2013 9:58 AM Performed by: Toula Moos L Pre-anesthesia Checklist: Patient identified, Emergency Drugs available, Suction available, Patient being monitored and Timeout performed Patient Re-evaluated:Patient Re-evaluated prior to inductionOxygen Delivery Method: Circle System Utilized Preoxygenation: Pre-oxygenation with 100% oxygen Intubation Type: IV induction Ventilation: Mask ventilation without difficulty LMA: LMA inserted LMA Size: 3.0 Number of attempts: 1 Airway Equipment and Method: bite block Placement Confirmation: positive ETCO2 and breath sounds checked- equal and bilateral Tube secured with: Tape Dental Injury: Teeth and Oropharynx as per pre-operative assessment

## 2013-06-19 NOTE — Anesthesia Postprocedure Evaluation (Signed)
  Anesthesia Post-op Note  Patient: Norma [name]  Procedure(s) Performed: Procedure(s): LEFT IRRIGATION AND DEBRIDEMENT WITH ACCELL/VAC PLACEMENT (Left)  Patient Location: PACU  Anesthesia Type:General  Level of Consciousness: awake, alert , oriented and patient cooperative  Airway and Oxygen Therapy: Patient Spontanous Breathing  Post-op Pain: none  Post-op Assessment: Post-op Vital signs reviewed, Patient's Cardiovascular Status Stable, Respiratory Function Stable, Patent Airway, No signs of Nausea or vomiting and Pain level controlled  Post-op Vital Signs: Reviewed and stable  Last Vitals:  Filed Vitals:   06/19/13 1155  BP: 130/80  Pulse: 70  Temp: 36.7 C  Resp: 16    Complications: No apparent anesthesia complications

## 2013-06-19 NOTE — Interval H&P Note (Signed)
History and Physical Interval Note:  06/19/2013 8:11 AM  Norma [name]  has presented today for surgery, with the diagnosis of Left Open wound of breast, without mention of complication  The various methods of treatment have been discussed with the patient and family. After consideration of risks, benefits and other options for treatment, the patient has consented to  Procedure(s): LEFT IRRIGATION AND DEBRIDEMENT WITH ACCELL/VAC PLACEMENT (Left) as a surgical intervention .  The patient's history has been reviewed, patient examined, no change in status, stable for surgery.  I have reviewed the patient's chart and labs.  Questions were answered to the patient's satisfaction.     Lyndee Leo [employer]

## 2013-06-19 NOTE — H&P (View-Only) (Signed)
Norma [name] is an 48 y.o. female.   Chief Complaint: Left breast wound HPI: The patient is a 48 yrs old female here for treatment of a left breast wound. She had a routine mammography (10//2012) with abnormalities on the right. Further views were done the following day and found to have no significant residual abnormality. Repeat mammography (02/2012) suggested an area of architectural distortion in the same quadrant of the right breast. Additional views (03/2012) showed a hypoechoic mass. The biopsy (03/2012) showed an invasive ductal carcinoma, grade 1-2, triple negative, with an MIB-1 of 100%. The Breast MRI (03/2012) showed an area of poorly defined enhancement in the right breast associated with clip artifact. She was found to be BRCA positive. The left breast showed an enhancing mass. She underwent bilateral mastectomies with initial reconstruction with implants by Drs. Streck and [employer]. She then went to Dr. Dorien Chihuahua in [place] [phone]) and had capsulectomies, implant removal of 800 cc and placement of silicone implants (left 320 cc, right 245 cc) and a reverse abdominal fasciocutaneous flap for reconstruction.  Her past medical history is positive for breast cancer and depression. Surgical history includes cruciate ligament repair, endometrial ablation, D & C, hysterectomy, bilateral mastectomies, basal cell carcinoma of nose, breast reconstruction with expanders/FlexHD. Her family history is positive for cancer of the breast, ovaries, esophageal cancer, liver, lung, colon and pancreas cancer. She has two children.  She is 5 feet 6 inches tall, weighs 165 pounds and was a 38C bra. The wound is at the inframammary fold of the left breast and is 7 x 2.5 x 2.5 cm with good granulation tissue at the base. There is no sign of infection. She has been using dakins solution on the area daily.  Past Medical History  Diagnosis Date  . PONV (postoperative nausea and vomiting)   . Depression   .  Breast cancer 03/20/2012    masectomy/implants  . SVD (spontaneous vaginal delivery)     x 2    Past Surgical History  Procedure Laterality Date  . Anterior cruciate ligament repair  2010  . Endometrial ablation      2012  . Dilation and curettage of uterus    . Basal cell carcinoma excision      OFF NOSE  2011   . Simple mastectomy w/ sentinel node biopsy Bilateral 06/15/2012    Dr Margot Chimes  . Simple mastectomy with axillary sentinel node biopsy Right 06/14/2012    Procedure: SIMPLE MASTECTOMY WITH AXILLARY SENTINEL NODE BIOPSY;  Surgeon: Haywood Lasso, MD;  Location: [facility];  Service: General;  Laterality: Right;  . Total mastectomy Left 06/14/2012    Procedure: TOTAL MASTECTOMY;  Surgeon: Haywood Lasso, MD;  Location: [facility];  Service: General;  Laterality: Left;  . Breast reconstruction with placement of tissue expander and flex hd (acellular hydrated dermis) Bilateral 06/14/2012    Procedure: BILATERAL BREAST RECONSTRUCTION WITH PLACEMENT OF BILATERAL TISSUE EXPANDERS;  Surgeon: Crissie Reese, MD;  Location: [facility];  Service: Plastics;  Laterality: Bilateral;  . Wisdom tooth extraction    . Eye surgery  2000    bilateral  . Laparoscopic assisted vaginal hysterectomy N/A 01/21/2013    Procedure: LAPAROSCOPIC ASSISTED VAGINAL HYSTERECTOMY;  Surgeon: Maeola Sarah. Landry Mellow, MD;  Location: [facility];  Service: Gynecology;  Laterality: N/A;  . Laparoscopic bilateral salpingo oopherectomy Bilateral 01/21/2013    Procedure: LAPAROSCOPIC BILATERAL SALPINGO OOPHORECTOMY;  Surgeon: Maeola Sarah. Landry Mellow, MD;  Location: [facility];  Service: Gynecology;  Laterality: Bilateral;  Family History  Problem Relation Age of Onset  . Breast cancer Maternal Aunt     dx in her 30s  . Ovarian cancer Maternal Aunt     dx in her 38s  . Esophageal cancer Maternal Uncle   . Liver cancer Maternal Uncle   . Lung cancer Maternal Uncle   . BRCA 1/2 Maternal Uncle     BRCA 2 +  . Breast cancer Cousin     dx in her 36s; BRCA2+  .  Pancreatic cancer Maternal Uncle     dx in his 51s  . BRCA 1/2 Maternal Uncle     BRCA2+  . Colon cancer Maternal Uncle    Social History:  reports that she has never smoked. She has never used smokeless tobacco. She reports that she drinks alcohol. She reports that she does not use illicit drugs.  Allergies:  Allergies  Allergen Reactions  . Adhesive [Tape] Rash    Rash  . Penicillins Nausea Only and Rash     (Not in a hospital admission)  No results found for this or any previous visit (from the past 48 hour(s)). No results found.  Review of Systems  Constitutional: Negative.   HENT: Negative.   Eyes: Negative.   Respiratory: Negative.   Cardiovascular: Negative.   Gastrointestinal: Negative.   Genitourinary: Negative.   Musculoskeletal: Negative.   Skin: Negative.   Neurological: Negative.   Psychiatric/Behavioral: Negative.     There were no vitals taken for this visit. Physical Exam  Constitutional: She is oriented to person, place, and time. She appears well-developed and well-nourished.  HENT:  Head: Normocephalic and atraumatic.  Eyes: Conjunctivae and EOM are normal. Pupils are equal, round, and reactive to light.  Cardiovascular: Normal rate.   Respiratory:    GI: Soft.  Musculoskeletal: Normal range of motion.  Neurological: She is alert and oriented to person, place, and time.  Skin: Skin is warm.  Psychiatric: She has a normal mood and affect. Her behavior is normal. Judgment and thought content normal.     Assessment/Plan Plan for irrigation and debridement of the left breast wound with Acell and VAC placement.  Claire Sanger 06/14/2013, 6:48 AM

## 2013-06-20 ENCOUNTER — Encounter (HOSPITAL_BASED_OUTPATIENT_CLINIC_OR_DEPARTMENT_OTHER): Payer: Self-pay | Admitting: Plastic Surgery

## 2013-06-24 ENCOUNTER — Other Ambulatory Visit: Payer: Self-pay | Admitting: Physician Assistant

## 2013-06-25 NOTE — Telephone Encounter (Signed)
OK to give 1 year prescription, per Dr Jana Hakim

## 2013-06-26 ENCOUNTER — Other Ambulatory Visit (HOSPITAL_BASED_OUTPATIENT_CLINIC_OR_DEPARTMENT_OTHER): Payer: BC Managed Care – PPO

## 2013-06-26 DIAGNOSIS — C50412 Malignant neoplasm of upper-outer quadrant of left female breast: Secondary | ICD-10-CM

## 2013-06-26 DIAGNOSIS — C50419 Malignant neoplasm of upper-outer quadrant of unspecified female breast: Secondary | ICD-10-CM

## 2013-06-26 DIAGNOSIS — Z1501 Genetic susceptibility to malignant neoplasm of breast: Secondary | ICD-10-CM

## 2013-06-26 DIAGNOSIS — Z1509 Genetic susceptibility to other malignant neoplasm: Secondary | ICD-10-CM

## 2013-06-26 LAB — COMPREHENSIVE METABOLIC PANEL (CC13)
ALT: 20 U/L (ref 0–55)
ANION GAP: 11 meq/L (ref 3–11)
AST: 23 U/L (ref 5–34)
Albumin: 4.3 g/dL (ref 3.5–5.0)
Alkaline Phosphatase: 83 U/L (ref 40–150)
BILIRUBIN TOTAL: 0.58 mg/dL (ref 0.20–1.20)
BUN: 16 mg/dL (ref 7.0–26.0)
CO2: 22 meq/L (ref 22–29)
CREATININE: 0.8 mg/dL (ref 0.6–1.1)
Calcium: 10 mg/dL (ref 8.4–10.4)
Chloride: 105 mEq/L (ref 98–109)
Glucose: 112 mg/dl (ref 70–140)
Potassium: 4.1 mEq/L (ref 3.5–5.1)
Sodium: 138 mEq/L (ref 136–145)
Total Protein: 6.9 g/dL (ref 6.4–8.3)

## 2013-06-26 LAB — CBC WITH DIFFERENTIAL/PLATELET
BASO%: 0.3 % (ref 0.0–2.0)
Basophils Absolute: 0 10*3/uL (ref 0.0–0.1)
EOS%: 3 % (ref 0.0–7.0)
Eosinophils Absolute: 0.1 10*3/uL (ref 0.0–0.5)
HEMATOCRIT: 38.2 % (ref 34.8–46.6)
HGB: 12.9 g/dL (ref 11.6–15.9)
LYMPH%: 25.1 % (ref 14.0–49.7)
MCH: 31.5 pg (ref 25.1–34.0)
MCHC: 33.8 g/dL (ref 31.5–36.0)
MCV: 93.2 fL (ref 79.5–101.0)
MONO#: 0.5 10*3/uL (ref 0.1–0.9)
MONO%: 12.4 % (ref 0.0–14.0)
NEUT#: 2.2 10*3/uL (ref 1.5–6.5)
NEUT%: 59.2 % (ref 38.4–76.8)
Platelets: 201 10*3/uL (ref 145–400)
RBC: 4.1 10*6/uL (ref 3.70–5.45)
RDW: 12.7 % (ref 11.2–14.5)
WBC: 3.7 10*3/uL — ABNORMAL LOW (ref 3.9–10.3)
lymph#: 0.9 10*3/uL (ref 0.9–3.3)

## 2013-07-03 ENCOUNTER — Telehealth: Payer: Self-pay | Admitting: Physician Assistant

## 2013-07-03 ENCOUNTER — Ambulatory Visit (HOSPITAL_BASED_OUTPATIENT_CLINIC_OR_DEPARTMENT_OTHER): Payer: BC Managed Care – PPO | Admitting: Oncology

## 2013-07-03 VITALS — BP 104/71 | HR 75 | Temp 98.6°F | Resp 18 | Ht 66.0 in | Wt 165.8 lb

## 2013-07-03 DIAGNOSIS — C50419 Malignant neoplasm of upper-outer quadrant of unspecified female breast: Secondary | ICD-10-CM

## 2013-07-03 DIAGNOSIS — Z1501 Genetic susceptibility to malignant neoplasm of breast: Secondary | ICD-10-CM

## 2013-07-03 DIAGNOSIS — Z171 Estrogen receptor negative status [ER-]: Secondary | ICD-10-CM

## 2013-07-03 DIAGNOSIS — Z1509 Genetic susceptibility to other malignant neoplasm: Principal | ICD-10-CM

## 2013-07-03 DIAGNOSIS — C50412 Malignant neoplasm of upper-outer quadrant of left female breast: Secondary | ICD-10-CM

## 2013-07-03 NOTE — Telephone Encounter (Signed)
, °

## 2013-07-03 NOTE — Progress Notes (Signed)
ID: Norma [name]   DOB: 1965-05-09  MR#: [id number]  YJW#:[id number]  PCP: Theadore Nan GYN: Christophe Louis SU: Osborn Coho OTHER MD: [name], [address], [address] Lauraine Rinne [phone])  CHIEF COMPLAINT:  BRCA-2 positive breast cancer/ follow-up  BREAST CANCER HISTORY and and he is in thank you for: Norma [name] had routine screening mammography at [facility] 11/16/2010 suggesting additional imaging on the right. This was performed the next day, and [name] no significant residual abnormality. Repeat screening mammography 03/15/2012 again suggested an area of architectural distortion in the right breast. In the same quadrant as before. Additional views 03/19/2012 [name] a 3 mm hypoechoic mass in the area in question. Biopsy of this mass 03/20/2012 showed an invasive ductal carcinoma, grade 1-2, triple negative, with an MIB-1 of 100%.  Breast MRI 03/23/2012 showed an area of poorly defined enhancement in the right breast associated with clip artifact. This measured 1 cm. In the left breast there was an enhancing mass measuring 2.0 cm. There were no other suspicious findings.   The patient's subsequent history is as detailed below.  INTERVAL HISTORY: Norma [name] returns today for followup of her right breast cancer. Since her last visit here she has started reconstruction under her Sylvester Harder in [place]. She is very pleased with the results of this procedure so far. She did have a complication and currently has a left wound VAC in place. Aside from medical issues, she continues to negotiate towards a definitive divorce. 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 She will be calling back to school to learn medical coding as new career.  REVIEW OF SYSTEMS: Norma [name] feels a lot less pressure on her chest now that her earlier implants have been removed. She has smaller implants in place now and is getting fat injections which mostly appeared to be taking well. Despite  the drainage under the left breast, there have been no fevers, chills, riders, or significant erythema. She has had some loose bowel movements secondary to a change in diet related to the reconstruction a detailed review of systems today was otherwise entirely noncontributory   PAST MEDICAL HISTORY: Past Medical History  Diagnosis Date  . PONV (postoperative nausea and vomiting)   . Depression   . Breast cancer 03/20/2012    masectomy/implants  . SVD (spontaneous vaginal delivery)     x 2    PAST SURGICAL HISTORY: Past Surgical History  Procedure Laterality Date  . Anterior cruciate ligament repair  2010  . Endometrial ablation      2012  . Dilation and curettage of uterus    . Basal cell carcinoma excision      OFF NOSE  2011   . Simple mastectomy w/ sentinel node biopsy Bilateral 06/15/2012    Dr Margot Chimes  . Simple mastectomy with axillary sentinel node biopsy Right 06/14/2012    Procedure: SIMPLE MASTECTOMY WITH AXILLARY SENTINEL NODE BIOPSY;  Surgeon: Haywood Lasso, MD;  Location: [facility];  Service: General;  Laterality: Right;  . Total mastectomy Left 06/14/2012    Procedure: TOTAL MASTECTOMY;  Surgeon: Haywood Lasso, MD;  Location: [facility];  Service: General;  Laterality: Left;  . Breast reconstruction with placement of tissue expander and flex hd (acellular hydrated dermis) Bilateral 06/14/2012    Procedure: BILATERAL BREAST RECONSTRUCTION WITH PLACEMENT OF BILATERAL TISSUE EXPANDERS;  Surgeon: Crissie Reese, MD;  Location: [facility];  Service: Plastics;  Laterality: Bilateral;  . Wisdom tooth extraction    . Eye  surgery  2000    bilateral  . Laparoscopic assisted vaginal hysterectomy N/A 01/21/2013    Procedure: LAPAROSCOPIC ASSISTED VAGINAL HYSTERECTOMY;  Surgeon: Maeola Sarah. Landry Mellow, MD;  Location: [facility];  Service: Gynecology;  Laterality: N/A;  . Laparoscopic bilateral salpingo oopherectomy Bilateral 01/21/2013    Procedure: LAPAROSCOPIC BILATERAL SALPINGO OOPHORECTOMY;  Surgeon: Maeola Sarah. Landry Mellow, MD;  Location: [facility];  Service: Gynecology;  Laterality: Bilateral;  . Placement of breast implants    . Incision and drainage of wound Left 06/19/2013    Procedure: LEFT IRRIGATION AND DEBRIDEMENT WITH ACCELL/VAC PLACEMENT;  Surgeon: Theodoro Kos, DO;  Location: [facility];  Service: Plastics;  Laterality: Left;  Moh's surgery for basal cell, Left nares  FAMILY HISTORY Family History  Problem Relation Age of Onset  . Breast cancer Maternal Aunt     dx in her 2s  . Ovarian cancer Maternal Aunt     dx in her 78s  . Esophageal cancer Maternal Uncle   . Liver cancer Maternal Uncle   . Lung cancer Maternal Uncle   . BRCA 1/2 Maternal Uncle     BRCA 2 +  . Breast cancer Cousin     dx in her 28s; BRCA2+  . Pancreatic cancer Maternal Uncle     dx in his 19s  . BRCA 1/2 Maternal Uncle     BRCA2+  . Colon cancer Maternal Uncle    the patient's parents are alive, in their early 61s. The patient has one brother and one sister. There is a significant family history of cancer and this includes one of the patient's mother is 2 sisters with ovarian and breast cancer. A first cousin of the patient had breast cancer in her 32s. She has been tested for the BRCA gene, but the patient does not know the results. There were also brothers of the patient's mother with esophageal pancreas and colon cancer. The patient has been scheduled for genetic testing.  GYNECOLOGIC HISTORY: (Updated February 2015)   Menarche age 55, first live birth age 33, she is [place] P2. The patient's periods were irregular, but still ongoing at the time of diagnosis. She did take birth control pills for approximately 10 years with no untoward events.  Status post TAH/BSO in December 2014.  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 Her soon to be ex-husband [name] is a Archivist. He owns his own business. They are in the process of divorce.  He is originally from [place]. Their children are [name]  who just finished his first year of college in [place], and Jason 15 as of October 2014.     ADVANCED DIRECTIVES: In place. Norma [name] has named her sister Wende Mott as her healthcare power of attorney. [name] can be reached at [phone]  HEALTH MAINTENANCE: History  Substance Use Topics  . Smoking status: Never Smoker   . Smokeless tobacco: Never Used  . Alcohol Use: Yes     Comment: socially     Colonoscopy: Never  PAP:  UTD, Dr. Landry Mellow  Bone density:  Never  Lipid panel:  Followed by Dr. Leonides Schanz   Allergies  Allergen Reactions  . Adhesive [Tape] Rash    Rash  . Penicillins Nausea Only and Rash    Current Outpatient Prescriptions  Medication Sig Dispense Refill  . buPROPion (WELLBUTRIN XL) 300 MG 24 hr tablet Take 300 mg by mouth every morning.       Marland Kitchen  escitalopram (LEXAPRO) 20 MG tablet TAKE 1 TABLET BY MOUTH EVERY DAY  30 tablet  11  . gabapentin (NEURONTIN) 300 MG capsule Take 1 capsule (300 mg total) by mouth at bedtime.  90 capsule  4  . ibuprofen (ADVIL,MOTRIN) 800 MG tablet Take 1 tablet (800 mg total) by mouth every 8 (eight) hours as needed for moderate pain (mild pain).  30 tablet  2  . Multiple Vitamins-Minerals (MULTIVITAMIN WITH MINERALS) tablet Take 1 tablet by mouth daily.      Marland Kitchen oxyCODONE-acetaminophen (PERCOCET/ROXICET) 5-325 MG per tablet Take 1-2 tablets by mouth every 4 (four) hours as needed for severe pain (moderate to severe pain (when tolerating fluids)).  30 tablet  0  . vitamin C (ASCORBIC ACID) 500 MG tablet Take 500 mg by mouth daily.      Marland Kitchen zinc gluconate 50 MG tablet Take 50 mg by mouth daily.       No current facility-administered medications for this visit.    OBJECTIVE: Middle-aged white woman in no acute distress  Filed Vitals:   07/03/13 1027  BP: 104/71  Pulse: 75  Temp: 98.6 F (37 C)  Resp: 18     Body mass index is 26.77 kg/(m^2).    ECOG FS: 1 Filed Weights   07/03/13 1027   Weight: 165 lb 12.8 oz (75.206 kg)   Head atraumatic; hair has come back quite curly  Sclerae unicteric, pupils equal and reactive Oropharynx clear and moist-- teeth in good repair No cervical or supraclavicular adenopathy Lungs no rales or rhonchi Heart regular rate and rhythm Abd soft, nontender, positive bowel sounds MSK no focal spinal tenderness, no upper extremity lymphedema Neuro: nonfocal, well oriented, positive affect Breasts: Status post bilateral mastectomies with implants in place. There is no evidence of local recurrence. The wound VAC in the inframammary fold of the left is not associated with erythema or tenderness. Both axillae are benign  LAB RESULTS: Lab Results  Component Value Date   WBC 3.7* 06/26/2013   NEUTROABS 2.2 06/26/2013   HGB 12.9 06/26/2013   HCT 38.2 06/26/2013   MCV 93.2 06/26/2013   PLT 201 06/26/2013      Chemistry      Component Value Date/Time   NA 138 06/26/2013 0947   NA 140 01/21/2013 0645   K 4.1 06/26/2013 0947   K 4.1 01/21/2013 0645   CL 104 01/21/2013 0645   CL 105 08/07/2012 0808   CO2 22 06/26/2013 0947   CO2 25 01/21/2013 0645   BUN 16.0 06/26/2013 0947   BUN 9 01/21/2013 0645   CREATININE 0.8 06/26/2013 0947   CREATININE 0.73 01/21/2013 0645      Component Value Date/Time   CALCIUM 10.0 06/26/2013 0947   CALCIUM 9.8 01/21/2013 0645   ALKPHOS 83 06/26/2013 0947   ALKPHOS 54 06/07/2012 1053   AST 23 06/26/2013 0947   AST 27 06/07/2012 1053   ALT 20 06/26/2013 0947   ALT 16 06/07/2012 1053   BILITOT 0.58 06/26/2013 0947   BILITOT 0.3 06/07/2012 1053       Lab Results  Component Value Date   LABCA2 23 03/28/2012     STUDIES: No results [name].  ASSESSMENT: 48 y.o. BRCA-2 positive Norma [name] woman   (1)  status post right breast biopsy 03/20/2012 for a clinical T1b N0, stage IA invasive ductal carcinoma, grade 1-2, triple negative, with an MIB-1 of 100%  (2) biopsy of a suspicious left breast mass was benign  (3) status post  bilateral mastectomies with right sentinel lymph node sampling and immediate expander placement 06/14/2012, for a right-sided pT1c pN0, stage IA invasive ductal carcinoma, grade 1, with ample margins, triple negative,  with an MIB-1 of 100%. (The left breast was benign)  (4) received 4 dose dense cycles of doxorubicin/ cyclophosphamide followed by 4 dose dense cycles of paclitaxel completed 11/05/2012  (5) BRCA2 positivity: The patient is status post bilateral mastectomies, and also status post  hysterectomy and bilateral salpingo-oophorectomy 01/21/2013 with benign pathology  PLAN:  Norma [name] is recovering well from her chemotherapy and she is moving on in her life. She is very excited about her current reconstruction prospects and will be continuing these through the summer.  It is gratifying that she is now a year out from her definitive surgery with no evidence of disease recurrence.  I suggested some Imodium for the diarrhea she is experiencing from the dietary change. If that does not work she will let us know and we will add Questran.  She will need some lab work done in July before further reconstructive work in [place]. She will let us know what as needed and when to schedule it for her.  Jakyra will see [place] again in 6 months; we will do lab work and physical exam at that visit. I am not planning on any restaging studies in the absence of specific symptoms to evaluate  Willo has a good understanding of the overall plan. She agrees with it. She knows a goal of treatment in her case is cure. She will call with any problems that may develop before her next visit here.  Chauncey Cruel, MD     07/03/2013

## 2013-07-18 ENCOUNTER — Encounter (HOSPITAL_BASED_OUTPATIENT_CLINIC_OR_DEPARTMENT_OTHER): Payer: BC Managed Care – PPO

## 2013-08-06 ENCOUNTER — Other Ambulatory Visit: Payer: Self-pay | Admitting: *Deleted

## 2013-08-06 DIAGNOSIS — C50412 Malignant neoplasm of upper-outer quadrant of left female breast: Secondary | ICD-10-CM

## 2013-08-07 ENCOUNTER — Other Ambulatory Visit: Payer: Self-pay | Admitting: Physician Assistant

## 2013-08-07 ENCOUNTER — Other Ambulatory Visit (HOSPITAL_BASED_OUTPATIENT_CLINIC_OR_DEPARTMENT_OTHER): Payer: BC Managed Care – PPO

## 2013-08-07 ENCOUNTER — Telehealth: Payer: Self-pay | Admitting: *Deleted

## 2013-08-07 DIAGNOSIS — C50412 Malignant neoplasm of upper-outer quadrant of left female breast: Secondary | ICD-10-CM

## 2013-08-07 DIAGNOSIS — Z1509 Genetic susceptibility to other malignant neoplasm: Secondary | ICD-10-CM

## 2013-08-07 DIAGNOSIS — Z1501 Genetic susceptibility to malignant neoplasm of breast: Secondary | ICD-10-CM

## 2013-08-07 DIAGNOSIS — C50419 Malignant neoplasm of upper-outer quadrant of unspecified female breast: Secondary | ICD-10-CM

## 2013-08-07 LAB — COMPREHENSIVE METABOLIC PANEL (CC13)
ALBUMIN: 4.3 g/dL (ref 3.5–5.0)
ALT: 28 U/L (ref 0–55)
ANION GAP: 9 meq/L (ref 3–11)
AST: 29 U/L (ref 5–34)
Alkaline Phosphatase: 86 U/L (ref 40–150)
BUN: 12.4 mg/dL (ref 7.0–26.0)
CALCIUM: 10.1 mg/dL (ref 8.4–10.4)
CHLORIDE: 108 meq/L (ref 98–109)
CO2: 26 meq/L (ref 22–29)
Creatinine: 0.9 mg/dL (ref 0.6–1.1)
Glucose: 110 mg/dl (ref 70–140)
Potassium: 5.1 mEq/L (ref 3.5–5.1)
Sodium: 143 mEq/L (ref 136–145)
Total Bilirubin: 0.81 mg/dL (ref 0.20–1.20)
Total Protein: 7.1 g/dL (ref 6.4–8.3)

## 2013-08-07 LAB — CBC WITH DIFFERENTIAL/PLATELET
BASO%: 0.5 % (ref 0.0–2.0)
BASOS ABS: 0 10*3/uL (ref 0.0–0.1)
EOS%: 2.9 % (ref 0.0–7.0)
Eosinophils Absolute: 0.1 10*3/uL (ref 0.0–0.5)
HEMATOCRIT: 41.8 % (ref 34.8–46.6)
HEMOGLOBIN: 14 g/dL (ref 11.6–15.9)
LYMPH#: 1.2 10*3/uL (ref 0.9–3.3)
LYMPH%: 30.1 % (ref 14.0–49.7)
MCH: 31.9 pg (ref 25.1–34.0)
MCHC: 33.6 g/dL (ref 31.5–36.0)
MCV: 95 fL (ref 79.5–101.0)
MONO#: 0.5 10*3/uL (ref 0.1–0.9)
MONO%: 11.4 % (ref 0.0–14.0)
NEUT%: 55.1 % (ref 38.4–76.8)
NEUTROS ABS: 2.2 10*3/uL (ref 1.5–6.5)
Platelets: 207 10*3/uL (ref 145–400)
RBC: 4.4 10*6/uL (ref 3.70–5.45)
RDW: 13.5 % (ref 11.2–14.5)
WBC: 4 10*3/uL (ref 3.9–10.3)

## 2013-08-07 NOTE — Telephone Encounter (Signed)
Rec'd pre-op testing orders request from patient for upcoming breast surgery to be done in [place] with Dr Dorien Chihuahua. Labs were drawn today, however, due to the absence of Dr Jana Hakim on vacation, we will be waiting for Dr Jana Hakim to return to town. Will leave this request for EKG and Medical Clearance in Dr Jannifer Rodney inbox. Left voicemail for Clyda Hurdle, Surgical Coordinator @ [phone].

## 2013-08-08 ENCOUNTER — Telehealth: Payer: Self-pay | Admitting: *Deleted

## 2013-08-08 NOTE — Telephone Encounter (Signed)
This RN spoke with Jenny Reichmann per Dr Lora Paula in [facility] regarding pre surgical work up.  Per Jenny Reichmann pt will need an EKG as well as labs.  Surgery is scheduled for 08/22/2013 " but all records must be received by 7/3 to maintain this surgical date "  This RN informed Jenny Reichmann above can be obtained and faxed prior to 08/16/2013.  This RN called and left message for Marielouise to return call to this RN to schedule EKG.  Fax number for reports to be faxed is [phone] Return call number for Jenny Reichmann is [phone].

## 2013-08-12 ENCOUNTER — Other Ambulatory Visit: Payer: BC Managed Care – PPO

## 2013-08-12 ENCOUNTER — Other Ambulatory Visit: Payer: Self-pay | Admitting: *Deleted

## 2013-08-12 DIAGNOSIS — C50412 Malignant neoplasm of upper-outer quadrant of left female breast: Secondary | ICD-10-CM

## 2013-08-14 ENCOUNTER — Telehealth: Payer: Self-pay | Admitting: *Deleted

## 2013-08-14 NOTE — Telephone Encounter (Signed)
Received call from [facility] stating that they need lab results faxed to them prior to patient's surgery on 08/22/13. This RN called [facility] to obtain lab results and they wouldn't fax them to me because I didn't know the account number. I spoke with the patient and asked her to call and get the results. [facility] wouldn't give the patient the lab results either. I told the patient that she needed to call [facility] and inform them of the situation. [facility] wouldn't give [facility] results either. At this time, the patient and [facility] are taking over obtaining lab results. Surgeon is looking for PT,PTT,INR and a UA.

## 2013-11-28 ENCOUNTER — Encounter: Payer: Self-pay | Admitting: *Deleted

## 2013-12-24 ENCOUNTER — Other Ambulatory Visit: Payer: Self-pay | Admitting: *Deleted

## 2013-12-24 DIAGNOSIS — C50412 Malignant neoplasm of upper-outer quadrant of left female breast: Secondary | ICD-10-CM

## 2013-12-25 ENCOUNTER — Other Ambulatory Visit (HOSPITAL_BASED_OUTPATIENT_CLINIC_OR_DEPARTMENT_OTHER): Payer: BC Managed Care – PPO

## 2013-12-25 DIAGNOSIS — C50412 Malignant neoplasm of upper-outer quadrant of left female breast: Secondary | ICD-10-CM

## 2013-12-25 DIAGNOSIS — Z853 Personal history of malignant neoplasm of breast: Secondary | ICD-10-CM

## 2013-12-25 LAB — COMPREHENSIVE METABOLIC PANEL (CC13)
ALBUMIN: 4.5 g/dL (ref 3.5–5.0)
ALK PHOS: 68 U/L (ref 40–150)
ALT: 49 U/L (ref 0–55)
AST: 46 U/L — AB (ref 5–34)
Anion Gap: 8 mEq/L (ref 3–11)
BUN: 11.6 mg/dL (ref 7.0–26.0)
CO2: 26 mEq/L (ref 22–29)
Calcium: 10 mg/dL (ref 8.4–10.4)
Chloride: 105 mEq/L (ref 98–109)
Creatinine: 0.8 mg/dL (ref 0.6–1.1)
Glucose: 89 mg/dl (ref 70–140)
POTASSIUM: 5.6 meq/L — AB (ref 3.5–5.1)
Sodium: 139 mEq/L (ref 136–145)
Total Bilirubin: 0.56 mg/dL (ref 0.20–1.20)
Total Protein: 6.9 g/dL (ref 6.4–8.3)

## 2013-12-25 LAB — CBC WITH DIFFERENTIAL/PLATELET
BASO%: 0.5 % (ref 0.0–2.0)
Basophils Absolute: 0 10*3/uL (ref 0.0–0.1)
EOS%: 3.2 % (ref 0.0–7.0)
Eosinophils Absolute: 0.1 10*3/uL (ref 0.0–0.5)
HCT: 40.6 % (ref 34.8–46.6)
HGB: 13.6 g/dL (ref 11.6–15.9)
LYMPH%: 33.1 % (ref 14.0–49.7)
MCH: 32 pg (ref 25.1–34.0)
MCHC: 33.5 g/dL (ref 31.5–36.0)
MCV: 95.5 fL (ref 79.5–101.0)
MONO#: 0.5 10*3/uL (ref 0.1–0.9)
MONO%: 12.1 % (ref 0.0–14.0)
NEUT#: 2 10*3/uL (ref 1.5–6.5)
NEUT%: 51.1 % (ref 38.4–76.8)
PLATELETS: 208 10*3/uL (ref 145–400)
RBC: 4.25 10*6/uL (ref 3.70–5.45)
RDW: 13.2 % (ref 11.2–14.5)
WBC: 3.9 10*3/uL (ref 3.9–10.3)
lymph#: 1.3 10*3/uL (ref 0.9–3.3)

## 2013-12-25 LAB — URINALYSIS, MICROSCOPIC - CHCC
BILIRUBIN (URINE): NEGATIVE
Blood: NEGATIVE
Glucose: NEGATIVE mg/dL
KETONES: 15 mg/dL
NITRITE: NEGATIVE
Protein: NEGATIVE mg/dL
RBC / HPF: NEGATIVE (ref 0–2)
Specific Gravity, Urine: 1.01 (ref 1.003–1.035)
Urobilinogen, UR: 0.2 mg/dL (ref 0.2–1)
pH: 6.5 (ref 4.6–8.0)

## 2013-12-26 LAB — APTT: aPTT: 32 seconds (ref 24–37)

## 2013-12-26 LAB — PROTHROMBIN TIME
INR: 0.92 (ref <1.50)
PROTHROMBIN TIME: 12.4 s (ref 11.6–15.2)

## 2014-01-01 ENCOUNTER — Ambulatory Visit (HOSPITAL_BASED_OUTPATIENT_CLINIC_OR_DEPARTMENT_OTHER): Payer: BC Managed Care – PPO | Admitting: Nurse Practitioner

## 2014-01-01 ENCOUNTER — Telehealth: Payer: Self-pay | Admitting: Nurse Practitioner

## 2014-01-01 VITALS — BP 127/86 | HR 81 | Temp 98.5°F | Resp 18 | Ht 66.0 in | Wt 157.9 lb

## 2014-01-01 DIAGNOSIS — E876 Hypokalemia: Secondary | ICD-10-CM

## 2014-01-01 DIAGNOSIS — Z171 Estrogen receptor negative status [ER-]: Secondary | ICD-10-CM

## 2014-01-01 DIAGNOSIS — C50811 Malignant neoplasm of overlapping sites of right female breast: Secondary | ICD-10-CM

## 2014-01-01 DIAGNOSIS — R748 Abnormal levels of other serum enzymes: Secondary | ICD-10-CM

## 2014-01-01 DIAGNOSIS — R740 Nonspecific elevation of levels of transaminase and lactic acid dehydrogenase [LDH]: Secondary | ICD-10-CM

## 2014-01-01 DIAGNOSIS — C50412 Malignant neoplasm of upper-outer quadrant of left female breast: Secondary | ICD-10-CM

## 2014-01-01 DIAGNOSIS — E875 Hyperkalemia: Secondary | ICD-10-CM

## 2014-01-01 NOTE — Progress Notes (Signed)
ID: Norma [name]   DOB: 1965/06/18  MR#: [id number]  TML#:[id number]  PCP: Theadore Nan GYN: Christophe Louis SU: Osborn Coho OTHER MD: [name], [address], [address] Norma [name] [phone])  CHIEF COMPLAINT:  BRCA-2 positive breast cancer/ follow-up  BREAST CANCER HISTORY and and he is in thank you for: Norma [name] had routine screening mammography at [facility] 11/16/2010 suggesting additional imaging on the right. This was performed the next day, and [name] no significant residual abnormality. Repeat screening mammography 03/15/2012 again suggested an area of architectural distortion in the right breast. In the same quadrant as before. Additional views 03/19/2012 [name] a 3 mm hypoechoic mass in the area in question. Biopsy of this mass 03/20/2012 showed an invasive ductal carcinoma, grade 1-2, triple negative, with an MIB-1 of 100%.  Breast MRI 03/23/2012 showed an area of poorly defined enhancement in the right breast associated with clip artifact. This measured 1 cm. In the left breast there was an enhancing mass measuring 2.0 cm. There were no other suspicious findings.   The patient's subsequent history is as detailed below.  INTERVAL HISTORY: Norma [name] returns today for follow up of her breast cancer. The interval history is generally unremarkable. There have been no changes in her health history. Her reconstruction sites of necrosis have healed and she has completed what she can as far as this surgery goes. She is "ok" with the results. Her divorce is complete and she endorses some depression and anxiety, but her lexapro and wellbutrin are helpful. She takes 339m gabapentin QHS for hot flashes.  REVIEW OF SYSTEMS: Norma [name] fevers, chills, nausea, vomiting, or changes in bowel or bladder habits. She has no headaches, dizziness, unexplained weight loss, bleeding, or pain. She denies shortness of breath, chest pain, cough, or palpitations. A detailed review of systems is  otherwise noncontributory.    PAST MEDICAL HISTORY: Past Medical History  Diagnosis Date  . PONV (postoperative nausea and vomiting)   . Depression   . Breast cancer 03/20/2012    masectomy/implants  . SVD (spontaneous vaginal delivery)     x 2    PAST SURGICAL HISTORY: Past Surgical History  Procedure Laterality Date  . Anterior cruciate ligament repair  2010  . Endometrial ablation      2012  . Dilation and curettage of uterus    . Basal cell carcinoma excision      OFF NOSE  2011   . Simple mastectomy w/ sentinel node biopsy Bilateral 06/15/2012    Dr SMargot Chimes . Simple mastectomy with axillary sentinel node biopsy Right 06/14/2012    Procedure: SIMPLE MASTECTOMY WITH AXILLARY SENTINEL NODE BIOPSY;  Surgeon: CHaywood Lasso MD;  [place]  Service: General;  Laterality: Right;  . Total mastectomy Left 06/14/2012    Procedure: TOTAL MASTECTOMY;  Surgeon: CHaywood Lasso MD;  [place]  Service: General;  Laterality: Left;  . Breast reconstruction with placement of tissue expander and flex hd (acellular hydrated dermis) Bilateral 06/14/2012    Procedure: BILATERAL BREAST RECONSTRUCTION WITH PLACEMENT OF BILATERAL TISSUE EXPANDERS;  Surgeon: DCrissie Reese MD;  [place] [facility]  Service: Plastics;  Laterality: Bilateral;  . Wisdom tooth extraction    . Eye surgery  2000    bilateral  . Laparoscopic assisted vaginal hysterectomy N/A 01/21/2013    Procedure: LAPAROSCOPIC ASSISTED VAGINAL HYSTERECTOMY;  Surgeon: TMaeola Sarah CLandry Mellow MD;  [place] [facility];  Service: Gynecology;  Laterality: N/A;  . Laparoscopic bilateral salpingo oopherectomy Bilateral 01/21/2013  Procedure: LAPAROSCOPIC BILATERAL SALPINGO OOPHORECTOMY;  Surgeon: Maeola Sarah. Landry Mellow, MD;  Location: [place] [facility];  Service: Gynecology;  Laterality: Bilateral;  . Placement of breast implants    . Incision and drainage of wound Left 06/19/2013    Procedure: LEFT IRRIGATION AND DEBRIDEMENT WITH ACCELL/VAC PLACEMENT;  Surgeon:  Theodoro Kos, DO;  Location: [place];  Service: Plastics;  Laterality: Left;  Moh's surgery for basal cell, Left nares  FAMILY HISTORY Family History  Problem Relation Age of Onset  . Breast cancer Maternal Aunt     dx in her 40s  . Ovarian cancer Maternal Aunt     dx in her 27s  . Esophageal cancer Maternal Uncle   . Liver cancer Maternal Uncle   . Lung cancer Maternal Uncle   . BRCA 1/2 Maternal Uncle     BRCA 2 +  . Breast cancer Cousin     dx in her 74s; BRCA2+  . Pancreatic cancer Maternal Uncle     dx in his 48s  . BRCA 1/2 Maternal Uncle     BRCA2+  . Colon cancer Maternal Uncle    the patient's parents are alive, in their early 19s. The patient has one brother and one sister. There is a significant family history of cancer and this includes one of the patient's mother is 2 sisters with ovarian and breast cancer. A first cousin of the patient had breast cancer in her 48s. She has been tested for the BRCA gene, but the patient does not know the results. There were also brothers of the patient's mother with esophageal pancreas and colon cancer. The patient has been scheduled for genetic testing.  GYNECOLOGIC HISTORY: (Updated February 2015)   Menarche age 63, first live birth age 82, she is Norma [name]. The patient's periods were irregular, but still ongoing at the time of diagnosis. She did take birth control pills for approximately 10 years with no untoward events.  Status post TAH/BSO in December 2014.  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 Her soon to be ex-husband Norma [name] is a Archivist. He owns his own business. They are in the process of divorce. He is originally from [place]. Their children are Norma [name]  who just finished his first year of college in [place], and Norma [name] 15 as of October 2014.     ADVANCED DIRECTIVES: In place. Norma [name] has named her sister Norma [name] as her healthcare  power of attorney. Norma [name] can be reached at [phone]  HEALTH MAINTENANCE: History  Substance Use Topics  . Smoking status: Never Smoker   . Smokeless tobacco: Never Used  . Alcohol Use: Yes     Comment: socially     Colonoscopy: Never  PAP:  UTD, Dr. Landry Mellow  Bone density:  Never  Lipid panel:  Followed by Dr. Leonides Schanz   Allergies  Allergen Reactions  . Adhesive [Tape] Rash    Rash  . Penicillins Nausea Only and Rash    Current Outpatient Prescriptions  Medication Sig Dispense Refill  . buPROPion (WELLBUTRIN XL) 300 MG 24 hr tablet Take 300 mg by mouth every morning.     . escitalopram (LEXAPRO) 20 MG tablet TAKE 1 TABLET BY MOUTH EVERY DAY (Patient taking differently: TAKE 1/2 TABLET BY MOUTH EVERY DAY) 30 tablet 11  . gabapentin (NEURONTIN) 300 MG capsule Take 1 capsule (300 mg total) by mouth at bedtime. 90 capsule 4  . ibuprofen (ADVIL,MOTRIN) 800 MG tablet  Take 1 tablet (800 mg total) by mouth every 8 (eight) hours as needed for moderate pain (mild pain). 30 tablet 2  . Multiple Vitamins-Minerals (MULTIVITAMIN WITH MINERALS) tablet Take 1 tablet by mouth daily.    Norma Kitchen oxyCODONE-acetaminophen (PERCOCET/ROXICET) 5-325 MG per tablet Take 1-2 tablets by mouth every 4 (four) hours as needed for severe pain (moderate to severe pain (when tolerating fluids)). 30 tablet 0   No current facility-administered medications for this visit.    OBJECTIVE: Middle-aged white woman in no acute distress  Filed Vitals:   01/01/14 0932  BP: 127/86  Pulse: 81  Temp: 98.5 F (36.9 C)  Resp: 18     Body mass index is 25.5 kg/(m^2).    ECOG FS: 1 Filed Weights   01/01/14 0932  Weight: 157 lb 14.4 oz (71.623 kg)   Skin: warm, dry  HEENT: sclerae anicteric, conjunctivae pink, oropharynx clear. No thrush or mucositis.  Lymph Nodes: No cervical or supraclavicular lymphadenopathy  Lungs: clear to auscultation bilaterally, no rales, wheezes, or rhonci  Heart: regular rate and rhythm  Abdomen:  round, soft, non tender, positive bowel sounds  Musculoskeletal: No focal spinal tenderness, no peripheral edema  Neuro: non focal, well oriented, positive affect  Breasts: bilateral breasts status post mastectomies and reconstruction. No evidence of local recurrence. Bilateral axillae benign.  LAB RESULTS: Lab Results  Component Value Date   WBC 3.9 12/25/2013   NEUTROABS 2.0 12/25/2013   HGB 13.6 12/25/2013   HCT 40.6 12/25/2013   MCV 95.5 12/25/2013   PLT 208 12/25/2013      Chemistry      Component Value Date/Time   NA 139 12/25/2013 0909   NA 140 01/21/2013 0645   K 5.6* 12/25/2013 0909   K 4.1 01/21/2013 0645   CL 104 01/21/2013 0645   CL 105 08/07/2012 0808   CO2 26 12/25/2013 0909   CO2 25 01/21/2013 0645   BUN 11.6 12/25/2013 0909   BUN 9 01/21/2013 0645   CREATININE 0.8 12/25/2013 0909   CREATININE 0.73 01/21/2013 0645      Component Value Date/Time   CALCIUM 10.0 12/25/2013 0909   CALCIUM 9.8 01/21/2013 0645   ALKPHOS 68 12/25/2013 0909   ALKPHOS 54 06/07/2012 1053   AST 46* 12/25/2013 0909   AST 27 06/07/2012 1053   ALT 49 12/25/2013 0909   ALT 16 06/07/2012 1053   BILITOT 0.56 12/25/2013 0909   BILITOT 0.3 06/07/2012 1053       Lab Results  Component Value Date   LABCA2 23 03/28/2012     STUDIES: No results [name].  ASSESSMENT: 48 y.o. BRCA-2 positive Norma [name] woman   (1)  status post right breast biopsy 03/20/2012 for a clinical T1b N0, stage IA invasive ductal carcinoma, grade 1-2, triple negative, with an MIB-1 of 100%  (2) biopsy of a suspicious left breast mass was benign  (3) status post bilateral mastectomies with right sentinel lymph node sampling and immediate expander placement 06/14/2012, for a right-sided pT1c pN0, stage IA invasive ductal carcinoma, grade 1, with ample margins, triple negative,  with an MIB-1 of 100%. (The left breast was benign)  (4) received 4 dose dense cycles of doxorubicin/ cyclophosphamide followed by 4  dose dense cycles of paclitaxel completed 11/05/2012  (5) BRCA2 positivity: The patient is status post bilateral mastectomies, and also status post  hysterectomy and bilateral salpingo-oophorectomy 01/21/2013 with benign pathology  PLAN:  Norma [name] is doing well as far as her breast cancer is concerned. The  labs were reviewed in detail and demonstrated an elevated AST. Norma [name] states that she has cut back on her drinking, but can still have 3-4 beers on the nights that she does drink. I have asked her to refrain from alcohol use at this time. Her potassium is also elevated and she states that she eats spinach almost twice daily during the week. We will continue to monitor this value. In the meantime, she will alternate her spinach use with other vegetables and avoid multivitamins with potasium.   Norma [name] will return to this clinic in 6 months for labs and a follow up visit. We will check a lipid panel at this time as she no longer has a PCP. She understands and agrees with this plan. She has been encouraged to call with any issues that might arise before her next visit here.  Marcelino Duster, NP     01/01/2014

## 2014-01-01 NOTE — Telephone Encounter (Signed)
per pof to sch pt appt-gave pt copy of sch °

## 2014-01-02 ENCOUNTER — Encounter: Payer: Self-pay | Admitting: Nurse Practitioner

## 2014-01-02 DIAGNOSIS — E875 Hyperkalemia: Secondary | ICD-10-CM | POA: Insufficient documentation

## 2014-01-02 MED ORDER — CIPROFLOXACIN HCL 250 MG PO TABS
500.0000 mg | ORAL_TABLET | Freq: Two times a day (BID) | ORAL | Status: DC
Start: 2014-01-02 — End: 2014-07-15

## 2014-04-03 IMAGING — US IR FLUORO GUIDE CV LINE*R*
1 series · 1 of 1 positions shown · non-contrast
Comparison: none

CLINICAL DATA: Breast carcinoma and need for Port-A-Cath to begin
chemotherapy.

[Series 1: sp fluoro guide cv line*left* · 1 of 1 slices shown]
[im 1/1]
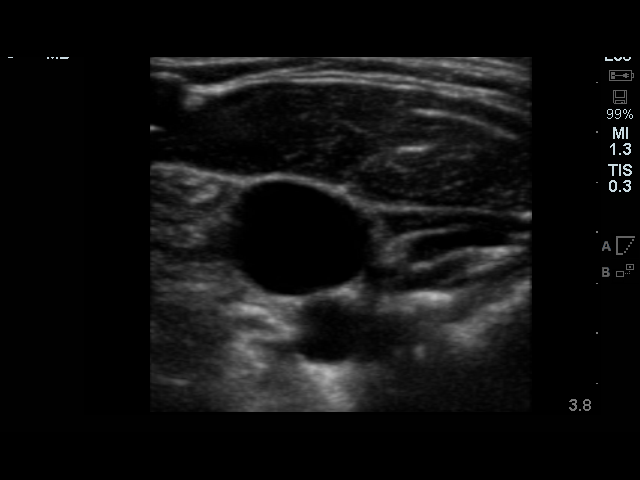

[1 of 1 positions shown; findings below may reference images not displayed]

IMPLANTED PORT A CATH PLACEMENT WITH ULTRASOUND AND FLUOROSCOPIC
GUIDANCE

Sedation:  4.0 mg IV Versed; 200 mcg IV Fentanyl.

Total Moderate Sedation Time:  40 minutes.

Additional Medications:  1 gram IV vancomycin.  Vancomycin was
given within two hours of incision.  Vancomycin was given due to an
antibiotic allergy.

Fluoroscopy Time:  42 seconds.

Procedure:  The procedure, risks, benefits, and alternatives were
explained to the patient.  Questions regarding the procedure were
encouraged and answered.  The patient understands and consents to
the procedure.

The right neck and chest were prepped with chlorhexidine in a
sterile fashion, and a sterile drape was applied covering the
operative field.  Maximum barrier sterile technique with sterile
gowns and gloves were used for the procedure.  Local anesthesia was
provided with 1% lidocaine and lidocaine with epinephrine.

After creating a small venotomy incision, a 21 gauge needle was
advanced into the right internal jugular vein under direct, real-
time ultrasound guidance.  Ultrasound image documentation was
performed.  After securing guidewire access, an 8 Fr dilator was
placed.  A J-wire was kinked to measure appropriate catheter
length.

A subcutaneous port pocket was then created along the upper chest
wall utilizing sharp and blunt dissection.  Portable cautery was
utilized.  The pocket was irrigated with sterile saline.

A single lumen power injectable port was chosen for placement.  The
8 Fr catheter was tunneled from the port pocket site to the
venotomy incision.  The port was placed in the pocket and secured
with two Ethilon tacking sutures.  External catheter was trimmed to
appropriate length based on guidewire measurement.

At the venotomy, an 8 Fr peel-away sheath was placed over a
guidewire.  The catheter was then placed through the sheath and the
sheath removed.  Final catheter positioning was confirmed and
documented with a fluoroscopic spot image.  The port was accessed
with a needle and aspirated and flushed with heparinized saline.
The needle was left in for use tomorrow.

The venotomy and port pocket incisions were closed with
subcutaneous 3-0 Monocryl and subcuticular 4-0 Vicryl.  Dermabond
was applied to both incisions.

Complications: None.  No pneumothorax.
FINDINGS: After catheter placement, the tip lies at the cavoatrial
junction.  The catheter aspirates normally and is ready for
immediate use.
IMPRESSION: Placement of single lumen port a cath via right internal jugular
vein.  The catheter tip lies at the cavoatrial junction.  A power
injectable port a cath was placed and is ready for immediate use.

## 2014-06-15 ENCOUNTER — Other Ambulatory Visit: Payer: Self-pay | Admitting: Oncology

## 2014-06-30 ENCOUNTER — Telehealth: Payer: Self-pay | Admitting: Oncology

## 2014-06-30 NOTE — Telephone Encounter (Signed)
Returned Advertising account executive. Left message to confirm reschedule appointment per patient to 05/31. Mailed calendar.

## 2014-07-02 ENCOUNTER — Other Ambulatory Visit: Payer: BC Managed Care – PPO

## 2014-07-02 ENCOUNTER — Ambulatory Visit: Payer: BC Managed Care – PPO | Admitting: Oncology

## 2014-07-15 ENCOUNTER — Telehealth: Payer: Self-pay | Admitting: Oncology

## 2014-07-15 ENCOUNTER — Ambulatory Visit (HOSPITAL_BASED_OUTPATIENT_CLINIC_OR_DEPARTMENT_OTHER): Payer: BLUE CROSS/BLUE SHIELD | Admitting: Oncology

## 2014-07-15 ENCOUNTER — Other Ambulatory Visit (HOSPITAL_BASED_OUTPATIENT_CLINIC_OR_DEPARTMENT_OTHER): Payer: BLUE CROSS/BLUE SHIELD

## 2014-07-15 ENCOUNTER — Other Ambulatory Visit: Payer: Self-pay | Admitting: Oncology

## 2014-07-15 VITALS — BP 128/81 | HR 67 | Temp 97.9°F | Resp 18 | Ht 66.0 in | Wt 158.1 lb

## 2014-07-15 DIAGNOSIS — Z1501 Genetic susceptibility to malignant neoplasm of breast: Secondary | ICD-10-CM | POA: Diagnosis not present

## 2014-07-15 DIAGNOSIS — C50411 Malignant neoplasm of upper-outer quadrant of right female breast: Secondary | ICD-10-CM

## 2014-07-15 DIAGNOSIS — C50412 Malignant neoplasm of upper-outer quadrant of left female breast: Secondary | ICD-10-CM

## 2014-07-15 DIAGNOSIS — R222 Localized swelling, mass and lump, trunk: Secondary | ICD-10-CM | POA: Diagnosis not present

## 2014-07-15 DIAGNOSIS — Z853 Personal history of malignant neoplasm of breast: Secondary | ICD-10-CM | POA: Diagnosis not present

## 2014-07-15 DIAGNOSIS — Z1509 Genetic susceptibility to other malignant neoplasm: Principal | ICD-10-CM

## 2014-07-15 LAB — CBC WITH DIFFERENTIAL/PLATELET
BASO%: 0.6 % (ref 0.0–2.0)
BASOS ABS: 0 10*3/uL (ref 0.0–0.1)
EOS%: 3.5 % (ref 0.0–7.0)
Eosinophils Absolute: 0.1 10*3/uL (ref 0.0–0.5)
HEMATOCRIT: 37.5 % (ref 34.8–46.6)
HGB: 13 g/dL (ref 11.6–15.9)
LYMPH%: 34.6 % (ref 14.0–49.7)
MCH: 33.4 pg (ref 25.1–34.0)
MCHC: 34.7 g/dL (ref 31.5–36.0)
MCV: 96.3 fL (ref 79.5–101.0)
MONO#: 0.5 10*3/uL (ref 0.1–0.9)
MONO%: 11.7 % (ref 0.0–14.0)
NEUT%: 49.6 % (ref 38.4–76.8)
NEUTROS ABS: 2 10*3/uL (ref 1.5–6.5)
PLATELETS: 196 10*3/uL (ref 145–400)
RBC: 3.9 10*6/uL (ref 3.70–5.45)
RDW: 12.4 % (ref 11.2–14.5)
WBC: 4.1 10*3/uL (ref 3.9–10.3)
lymph#: 1.4 10*3/uL (ref 0.9–3.3)

## 2014-07-15 LAB — COMPREHENSIVE METABOLIC PANEL (CC13)
ALBUMIN: 4.2 g/dL (ref 3.5–5.0)
ALT: 19 U/L (ref 0–55)
AST: 28 U/L (ref 5–34)
Alkaline Phosphatase: 65 U/L (ref 40–150)
Anion Gap: 8 mEq/L (ref 3–11)
BUN: 13.2 mg/dL (ref 7.0–26.0)
CALCIUM: 9.3 mg/dL (ref 8.4–10.4)
CHLORIDE: 105 meq/L (ref 98–109)
CO2: 26 mEq/L (ref 22–29)
Creatinine: 0.8 mg/dL (ref 0.6–1.1)
EGFR: 87 mL/min/{1.73_m2} — ABNORMAL LOW (ref >90)
GLUCOSE: 114 mg/dL (ref 70–140)
POTASSIUM: 4.8 meq/L (ref 3.5–5.1)
SODIUM: 139 meq/L (ref 136–145)
TOTAL PROTEIN: 6.4 g/dL (ref 6.4–8.3)
Total Bilirubin: 0.55 mg/dL (ref 0.20–1.20)

## 2014-07-15 LAB — LIPID PANEL
CHOL/HDL RATIO: 2 ratio
Cholesterol: 197 mg/dL (ref 0–200)
HDL: 98 mg/dL (ref >=46)
LDL Cholesterol: 89 mg/dL (ref 0–99)
Triglycerides: 49 mg/dL (ref <150)
VLDL: 10 mg/dL (ref 0–40)

## 2014-07-15 NOTE — Telephone Encounter (Signed)
Called and left a message with dr Donne Hazel 08/08/14 8;40

## 2014-07-15 NOTE — Progress Notes (Signed)
ID: [name]   DOB: Jul 04, 1965  MR#: [id number]  WER#:[id number]  PCP: Norma Coffin MD GYN: Norma [name] M.D. Norma [name]]; Norma [name] M.D. 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 This was performed the next day, and found no significant residual abnormality. Repeat screening mammography 03/15/2012 again suggested an area of architectural distortion in the right breast. In the same quadrant as before. Additional views 03/19/2012 found a 3 mm hypoechoic mass in the area in question. Biopsy of this mass 03/20/2012 showed an invasive ductal carcinoma, grade 1-2, triple negative, with an MIB-1 of 100%.  Breast MRI 03/23/2012 showed an area of poorly defined enhancement in the right breast associated with clip artifact. This measured 1 cm. In the left breast there was an enhancing mass measuring 2.0 cm. There were no other suspicious findings.   The patient's subsequent history is as detailed below.  INTERVAL HISTORY: Norma [name] returns today for follow up of her breast cancer. Since her last visit here she had bilateral cataract surgery Norma [name] and now can read without glasses. She also had an episode of pneumonia this past winter, treated through [facility] at [facility]. 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 She is sleeping mostly well. She drinks perhaps a glass of wine or some  beer 2 or 3 times a week. Her hair on the back is thinning a little bit. Hot flashes are not a major issue. Vaginal dryness is not a concern. A detailed review of systems today was otherwise stable    PAST MEDICAL HISTORY: Past Medical History  Diagnosis Date  . PONV (postoperative nausea and vomiting)   . Depression   . Breast cancer 03/20/2012    masectomy/implants  . SVD (spontaneous vaginal delivery)     x 2    PAST SURGICAL HISTORY: Past Surgical History  Procedure Laterality Date  . Anterior cruciate ligament repair  2010  . Endometrial ablation      2012  . Dilation and curettage of uterus    . Basal cell carcinoma excision      OFF NOSE  2011   . Simple mastectomy w/ sentinel node biopsy Bilateral 06/15/2012    Dr Norma [name]  . Simple mastectomy with axillary sentinel node biopsy Right 06/14/2012    Procedure: SIMPLE MASTECTOMY WITH AXILLARY SENTINEL NODE BIOPSY;  Surgeon: Norma Lasso, MD;  Location: [facility];  Service: General;  Laterality: Right;  . Total mastectomy Left 06/14/2012    Procedure: TOTAL MASTECTOMY;  Surgeon: Norma Lasso, MD;  Location: [facility];  Service: General;  Laterality: Left;  . Breast reconstruction with placement of tissue expander and flex hd (acellular hydrated dermis) Bilateral 06/14/2012    Procedure: BILATERAL BREAST RECONSTRUCTION WITH PLACEMENT OF BILATERAL TISSUE EXPANDERS;  Surgeon: Norma Reese, MD;  Location: [facility];  Service: Plastics;  Laterality: Bilateral;  . Wisdom tooth extraction    . Eye surgery  2000  bilateral  . Laparoscopic assisted vaginal hysterectomy N/A 01/21/2013    Procedure: LAPAROSCOPIC ASSISTED VAGINAL HYSTERECTOMY;  Surgeon: Norma Sarah. Landry Mellow, MD;  Location: [facility];  Service: Gynecology;  Laterality: N/A;  . Laparoscopic bilateral salpingo oopherectomy Bilateral 01/21/2013    Procedure: LAPAROSCOPIC BILATERAL SALPINGO OOPHORECTOMY;  Surgeon: Norma Sarah. Landry Mellow, MD;  Location: [facility];  Service: Gynecology;  Laterality: Bilateral;   . Placement of breast implants    . Incision and drainage of wound Left 06/19/2013    Procedure: LEFT IRRIGATION AND DEBRIDEMENT WITH ACCELL/VAC PLACEMENT;  Surgeon: Norma Kos, DO;  Location: [facility];  Service: Plastics;  Laterality: Left;  Moh's surgery for basal cell, Left nares  FAMILY HISTORY Family History  Problem Relation Age of Onset  . Breast cancer Maternal Aunt     dx in her 57s  . Ovarian cancer Maternal Aunt     dx in her 34s  . Esophageal cancer Maternal Uncle   . Liver cancer Maternal Uncle   . Lung cancer Maternal Uncle   . BRCA 1/2 Maternal Uncle     BRCA 2 +  . Breast cancer Cousin     dx in her 54s; BRCA2+  . Pancreatic cancer Maternal Uncle     dx in his 56s  . BRCA 1/2 Maternal Uncle     BRCA2+  . Colon cancer Maternal Uncle    the patient's parents are alive, in their early 66s. The patient has one brother and one sister. There is a significant family history of cancer and this includes one of the patient's mother is 2 sisters with ovarian and breast cancer. A first cousin of the patient had breast cancer in her 44s. She has been tested for the BRCA gene, but the patient does not know the results. There were also brothers of the patient's mother with esophageal pancreas and colon cancer. The patient has been scheduled for genetic testing.  GYNECOLOGIC HISTORY: (Updated February 2015)   Menarche age 85, first live birth age 28, she is [place] P2. The patient's periods were irregular, but still ongoing at the time of diagnosis. She did take birth control pills for approximately 10 years with no untoward events.  Status post TAH/BSO in December 2014.  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 Her ex-husband Norma [name] is a Archivist. He owns his own business. They are now divorced. He is originally from [place]. Their children are Norma [name]  who just finished his second year of college  in [place], and Norma [name] 16 as of October 2014, with mild autism.     ADVANCED DIRECTIVES: In place. Norma [name] has named her sister Norma [name] as her healthcare power of attorney. Norma [name] can be reached at [phone]  HEALTH MAINTENANCE: History  Substance Use Topics  . Smoking status: Never Smoker   . Smokeless tobacco: Never Used  . Alcohol Use: Yes     Comment: socially     Colonoscopy: Never  PAP:  UTD, Dr. Landry [name]  Bone density:  Never  Lipid panel:  Followed by Dr. Leonides Schanz   Allergies  Allergen Reactions  . Adhesive [Tape] Rash    Rash  . Penicillins Nausea Only and Rash    Current Outpatient Prescriptions  Medication Sig Dispense Refill  . buPROPion (WELLBUTRIN XL) 300 MG 24 hr tablet Take 300 mg by mouth every morning.     . ciprofloxacin (CIPRO) 250 MG tablet Take 2 tablets (500 mg total)  by mouth 2 (two) times daily. 20 tablet 0  . escitalopram (LEXAPRO) 20 MG tablet TAKE 1 TABLET BY MOUTH DAILY 30 tablet 0  . gabapentin (NEURONTIN) 300 MG capsule Take 1 capsule (300 mg total) by mouth at bedtime. 90 capsule 4  . ibuprofen (ADVIL,MOTRIN) 800 MG tablet Take 1 tablet (800 mg total) by mouth every 8 (eight) hours as needed for moderate pain (mild pain). 30 tablet 2  . Multiple Vitamins-Minerals (MULTIVITAMIN WITH MINERALS) tablet Take 1 tablet by mouth daily.    Marland Kitchen oxyCODONE-acetaminophen (PERCOCET/ROXICET) 5-325 MG per tablet Take 1-2 tablets by mouth every 4 (four) hours as needed for severe pain (moderate to severe pain (when tolerating fluids)). 30 tablet 0   No current facility-administered medications for this visit.    OBJECTIVE: Middle-aged white woman who appears well Filed Vitals:   07/15/14 0921  BP: 128/81  Pulse: 67  Temp: 97.9 F (36.6 C)  Resp: 18     Body mass index is 25.53 kg/(m^2).    ECOG FS: 0 Filed Weights   07/15/14 2025  Weight: 158 lb 1.6 oz (71.714 kg)   Sclerae unicteric, pupils round and equal Oropharynx clear and moist-- no thrush or other  lesions No cervical or supraclavicular adenopathy Lungs no rales or rhonchi Heart regular rate and rhythm Abd soft, nontender, positive bowel sounds MSK no focal spinal tenderness, no upper extremity lymphedema Neuro: nonfocal, well oriented, appropriate affect Breasts: Status post bilateral mastectomies with bilateral reconstruction. There is no evidence of chest wall recurrence. Both axillae are benign Skin: Between the breasts there is a 2 mm subcutaneous bump, which is not erythematous, or tender. It is not clear whether there is a pore in the center of it (thereseems to be when she is lying down but not when she is sitting up).   LAB RESULTS: Lab Results  Component Value Date   WBC 4.1 07/15/2014   NEUTROABS 2.0 07/15/2014   HGB 13.0 07/15/2014   HCT 37.5 07/15/2014   MCV 96.3 07/15/2014   PLT 196 07/15/2014      Chemistry      Component Value Date/Time   NA 139 12/25/2013 0909   NA 140 01/21/2013 0645   K 5.6* 12/25/2013 0909   K 4.1 01/21/2013 0645   CL 104 01/21/2013 0645   CL 105 08/07/2012 0808   CO2 26 12/25/2013 0909   CO2 25 01/21/2013 0645   BUN 11.6 12/25/2013 0909   BUN 9 01/21/2013 0645   CREATININE 0.8 12/25/2013 0909   CREATININE 0.73 01/21/2013 0645      Component Value Date/Time   CALCIUM 10.0 12/25/2013 0909   CALCIUM 9.8 01/21/2013 0645   ALKPHOS 68 12/25/2013 0909   ALKPHOS 54 06/07/2012 1053   AST 46* 12/25/2013 0909   AST 27 06/07/2012 1053   ALT 49 12/25/2013 0909   ALT 16 06/07/2012 1053   BILITOT 0.56 12/25/2013 0909   BILITOT 0.3 06/07/2012 1053       Lab Results  Component Value Date   LABCA2 23 03/28/2012     STUDIES: No results found.  ASSESSMENT: 49 y.o. BRCA-2 positive Starling Manns woman   (1)  status post right breast biopsy 03/20/2012 for a clinical T1b N0, stage IA invasive ductal carcinoma, grade 1-2, triple negative, with an MIB-1 of 100%  (2) biopsy of a suspicious left breast mass was benign  (3) status post  bilateral mastectomies with right sentinel lymph node sampling and immediate expander placement 06/14/2012, for a right-sided  pT1c pN0, stage IA invasive ductal carcinoma, grade 1, with ample margins, triple negative,  with an MIB-1 of 100%. (The left breast was benign)  (4) received 4 dose dense cycles of doxorubicin/ cyclophosphamide followed by 4 dose dense cycles of paclitaxel completed 11/05/2012  (5) BRCA2 positivity: The patient is status post bilateral mastectomies, and also status post  hysterectomy and bilateral salpingo-oophorectomy 01/21/2013 with benign pathology  PLAN:  Kayren is now 2 years out from her definitive surgery, which is very favorable. I do not know what the small bump between the breasts is. It could be scar tissue, a little sebaceous cyst, or subcutaneous area of recurrence. I think it is best to send her back to surgery to biopsy this. She is very much in favor of that option  Unfortunately we don't have her liver function tests or lipid panel available today. I'm going to obtain her lab work a week before the next visit so we can discuss results while she is here is that of her having to wait until labs come in after the visit and then try to discuss that over the phone.  I'm going to continue to see her on an every 6 month basis at least through the coming year. She knows to call for any problems that may develop before the next visit.  Chauncey Cruel, MD     07/15/2014

## 2014-07-15 NOTE — Telephone Encounter (Signed)
Appointments made and avs printed for patient °

## 2014-08-08 ENCOUNTER — Telehealth: Payer: Self-pay

## 2014-08-08 NOTE — Telephone Encounter (Signed)
Office notes dtd 08/08/14 rcvd from [facility].  Reviewed by Dr. Jana Hakim.  Sent to scan.

## 2014-08-15 ENCOUNTER — Other Ambulatory Visit: Payer: Self-pay | Admitting: General Surgery

## 2014-08-20 ENCOUNTER — Other Ambulatory Visit: Payer: Self-pay | Admitting: Oncology

## 2014-09-22 ENCOUNTER — Other Ambulatory Visit: Payer: Self-pay | Admitting: Oncology

## 2014-12-29 ENCOUNTER — Other Ambulatory Visit (HOSPITAL_BASED_OUTPATIENT_CLINIC_OR_DEPARTMENT_OTHER): Payer: BLUE CROSS/BLUE SHIELD

## 2014-12-29 DIAGNOSIS — Z1509 Genetic susceptibility to other malignant neoplasm: Principal | ICD-10-CM

## 2014-12-29 DIAGNOSIS — Z853 Personal history of malignant neoplasm of breast: Secondary | ICD-10-CM

## 2014-12-29 DIAGNOSIS — Z1501 Genetic susceptibility to malignant neoplasm of breast: Secondary | ICD-10-CM

## 2014-12-29 DIAGNOSIS — C50411 Malignant neoplasm of upper-outer quadrant of right female breast: Secondary | ICD-10-CM

## 2014-12-29 DIAGNOSIS — C50412 Malignant neoplasm of upper-outer quadrant of left female breast: Secondary | ICD-10-CM

## 2014-12-29 LAB — CBC WITH DIFFERENTIAL/PLATELET
BASO%: 0.4 % (ref 0.0–2.0)
Basophils Absolute: 0 10*3/uL (ref 0.0–0.1)
EOS%: 4.8 % (ref 0.0–7.0)
Eosinophils Absolute: 0.2 10*3/uL (ref 0.0–0.5)
HCT: 41.1 % (ref 34.8–46.6)
HEMOGLOBIN: 13.8 g/dL (ref 11.6–15.9)
LYMPH#: 1.6 10*3/uL (ref 0.9–3.3)
LYMPH%: 33.9 % (ref 14.0–49.7)
MCH: 32.7 pg (ref 25.1–34.0)
MCHC: 33.6 g/dL (ref 31.5–36.0)
MCV: 97.3 fL (ref 79.5–101.0)
MONO#: 0.5 10*3/uL (ref 0.1–0.9)
MONO%: 11 % (ref 0.0–14.0)
NEUT#: 2.3 10*3/uL (ref 1.5–6.5)
NEUT%: 49.9 % (ref 38.4–76.8)
Platelets: 190 10*3/uL (ref 145–400)
RBC: 4.23 10*6/uL (ref 3.70–5.45)
RDW: 12.2 % (ref 11.2–14.5)
WBC: 4.7 10*3/uL (ref 3.9–10.3)

## 2014-12-29 LAB — COMPREHENSIVE METABOLIC PANEL (CC13)
ALT: 22 U/L (ref 0–55)
AST: 29 U/L (ref 5–34)
Albumin: 4.4 g/dL (ref 3.5–5.0)
Alkaline Phosphatase: 65 U/L (ref 40–150)
Anion Gap: 10 mEq/L (ref 3–11)
BILIRUBIN TOTAL: 0.96 mg/dL (ref 0.20–1.20)
BUN: 13.4 mg/dL (ref 7.0–26.0)
CO2: 26 meq/L (ref 22–29)
Calcium: 9.9 mg/dL (ref 8.4–10.4)
Chloride: 103 mEq/L (ref 98–109)
Creatinine: 0.8 mg/dL (ref 0.6–1.1)
EGFR: 81 mL/min/{1.73_m2} — ABNORMAL LOW (ref >90)
GLUCOSE: 100 mg/dL (ref 70–140)
Potassium: 4.5 mEq/L (ref 3.5–5.1)
SODIUM: 138 meq/L (ref 136–145)
TOTAL PROTEIN: 6.9 g/dL (ref 6.4–8.3)

## 2015-01-05 ENCOUNTER — Ambulatory Visit (HOSPITAL_BASED_OUTPATIENT_CLINIC_OR_DEPARTMENT_OTHER): Payer: BLUE CROSS/BLUE SHIELD | Admitting: Oncology

## 2015-01-05 ENCOUNTER — Telehealth: Payer: Self-pay | Admitting: Oncology

## 2015-01-05 VITALS — BP 123/79 | HR 70 | Temp 99.1°F | Resp 18 | Ht 66.0 in | Wt 171.0 lb

## 2015-01-05 DIAGNOSIS — C50411 Malignant neoplasm of upper-outer quadrant of right female breast: Secondary | ICD-10-CM | POA: Diagnosis not present

## 2015-01-05 DIAGNOSIS — Z1501 Genetic susceptibility to malignant neoplasm of breast: Secondary | ICD-10-CM | POA: Diagnosis not present

## 2015-01-05 DIAGNOSIS — Z1509 Genetic susceptibility to other malignant neoplasm: Secondary | ICD-10-CM

## 2015-01-05 NOTE — Progress Notes (Signed)
ID: [name]   DOB: Feb 23, 1965  MR#: [id number]  VQQ#:[id number]  PCP: [name] MD GYN: [name] [name]; [name] OTHER MD: [name], [address] Lauraine Rinne [phone])  CHIEF COMPLAINT:  BRCA-2 positive, triple negative breast cancer  CURRENT TREATMENT: Observation  BREAST CANCER HISTORY: From the original intake note:  Georgina Peer had routine screening mammography at [facility] 11/16/2010 suggesting additional imaging on the right. This was performed the next day, and found no significant residual abnormality. Repeat screening mammography 03/15/2012 again suggested an area of architectural distortion in the right breast. In the same quadrant as before. Additional views 03/19/2012 found a 3 mm hypoechoic mass in the area in question. Biopsy of this mass 03/20/2012 showed an invasive ductal carcinoma, grade 1-2, triple negative, with an MIB-1 of 100%.  Breast MRI 03/23/2012 showed an area of poorly defined enhancement in the right breast associated with clip artifact. This measured 1 cm. In the left breast there was an enhancing mass measuring 2.0 cm. There were no other suspicious findings.   The patient's subsequent history is as detailed below.  INTERVAL HISTORY: Zonie returns today for follow up of her triple negative breast cancer. The last visit we sent her back for biopsy of a chest wall lesion. This was performed 08/15/2014 and showed (DAA 16-07/04/2001) an epidermoid cyst. Otherwise she reports no change in either breast.  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 She achieved this with a very good report and tells me she has a job with [employer] lined up for January. She is planning to get back in to the gym as soon as she can. Aside from these issues a detailed review of systems today  was entirely negative  PAST MEDICAL HISTORY: Past Medical History  Diagnosis Date  . PONV (postoperative nausea and vomiting)   . Depression   . Breast cancer 03/20/2012    masectomy/implants  . SVD (spontaneous vaginal delivery)     x 2    PAST SURGICAL HISTORY: Past Surgical History  Procedure Laterality Date  . Anterior cruciate ligament repair  2010  . Endometrial ablation      2012  . Dilation and curettage of uterus    . Basal cell carcinoma excision      OFF NOSE  2011   . Simple mastectomy w/ sentinel node biopsy Bilateral 06/15/2012    Dr Margot Chimes  . Simple mastectomy with axillary sentinel node biopsy Right 06/14/2012    Procedure: SIMPLE MASTECTOMY WITH AXILLARY SENTINEL NODE BIOPSY;  Surgeon: Haywood Lasso, MD;  Location: [facility];  Service: General;  Laterality: Right;  . Total mastectomy Left 06/14/2012    Procedure: TOTAL MASTECTOMY;  Surgeon: Haywood Lasso, MD;  Location: [facility];  Service: General;  Laterality: Left;  . Breast reconstruction with placement of tissue expander and flex hd (acellular hydrated dermis) Bilateral 06/14/2012    Procedure: BILATERAL BREAST RECONSTRUCTION WITH PLACEMENT OF BILATERAL TISSUE EXPANDERS;  Surgeon: Crissie Reese, MD;  Location: [facility];  Service: Plastics;  Laterality: Bilateral;  . Wisdom tooth extraction    . Eye surgery  2000    bilateral  . Laparoscopic assisted vaginal hysterectomy N/A 01/21/2013    Procedure: LAPAROSCOPIC ASSISTED VAGINAL HYSTERECTOMY;  Surgeon: Maeola Sarah. Landry Mellow, MD;  Location: [facility];  Service: Gynecology;  Laterality: N/A;  . Laparoscopic bilateral  salpingo oopherectomy Bilateral 01/21/2013    Procedure: LAPAROSCOPIC BILATERAL SALPINGO OOPHORECTOMY;  Surgeon: Maeola Sarah. Landry Mellow, MD;  Location: [facility];  Service: Gynecology;  Laterality: Bilateral;  . Placement of breast implants    . Incision and drainage of wound Left 06/19/2013    Procedure: LEFT IRRIGATION AND DEBRIDEMENT WITH ACCELL/VAC PLACEMENT;  Surgeon: Theodoro Kos, DO;  Location: [facility];  Service: Plastics;  Laterality: Left;  Moh's surgery for basal cell, Left nares  FAMILY HISTORY Family History  Problem Relation Age of Onset  . Breast cancer Maternal Aunt     dx in her 50s  . Ovarian cancer Maternal Aunt     dx in her 50s  . Esophageal cancer Maternal Uncle   . Liver cancer Maternal Uncle   . Lung cancer Maternal Uncle   . BRCA 1/2 Maternal Uncle     BRCA 2 +  . Breast cancer Cousin     dx in her 50s; BRCA2+  . Pancreatic cancer Maternal Uncle     dx in his 50s  . BRCA 1/2 Maternal Uncle     BRCA2+  . Colon cancer Maternal Uncle    the patient's parents are alive, in their early 41s. The patient has one brother and one sister. There is a significant family history of cancer and this includes one of the patient's mother is 2 sisters with ovarian and breast cancer. A first cousin of the patient had breast cancer in her 50s. She has been tested for the BRCA gene, but the patient does not know the results. There were also brothers of the patient's mother with esophageal pancreas and colon cancer. The patient has been genetically tested and does carry a BRCA 2 mutation  GYNECOLOGIC HISTORY: (Updated February 2015)   Menarche age 64, first live birth age 36, she is [place] P2. The patient's periods were irregular, but still ongoing at the time of diagnosis. She did take birth control pills for approximately 10 years with no untoward events.  Status post TAH/BSO in December 2014.  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 Her ex-husband [name] is a Archivist. He owns his own business. They are now divorced. He is originally from [place]. Their children are [name] , 20, who attends college in [place], and [name], who is a Paramedic in [employer], with mild autism.     ADVANCED DIRECTIVES: In place. Georgina Peer has named her sister Wende Mott as her healthcare power of attorney. [name] can be reached at [place]: Social History  Substance Use Topics  . Smoking status: Never Smoker   . Smokeless tobacco: Never Used  . Alcohol Use: Yes     Comment: socially     Colonoscopy: Never  PAP:  UTD, Dr. Landry Mellow  Bone density:  Never  Lipid panel:  Followed by Dr. Leonides Schanz   Allergies  Allergen Reactions  . Adhesive [Tape] Rash    Rash  . Penicillins Nausea Only and Rash    Current Outpatient Prescriptions  Medication Sig Dispense Refill  . buPROPion (WELLBUTRIN XL) 300 MG 24 hr tablet Take 300 mg by mouth every morning.     . escitalopram (LEXAPRO) 20 MG tablet TAKE 1 TABLET BY MOUTH DAILY 30 tablet 3   No current facility-administered medications for this visit.  OBJECTIVE: Middle-aged white woman in no acute distress Filed Vitals:   01/05/15 1008  BP: 123/79  Pulse: 70  Temp: 99.1 F (37.3 C)  Resp: 18     Body mass index is 27.61 kg/(m^2).    ECOG FS: 0 Filed Weights   01/05/15 1008  Weight: 171 lb (77.565 kg)   Sclerae unicteric, EOMs intact Oropharynx clear, dentition in good repair No cervical or supraclavicular adenopathy Lungs no rales or rhonchi Heart regular rate and rhythm Abd soft, nontender, positive bowel sounds MSK no focal spinal tenderness, no upper extremity lymphedema Neuro: nonfocal, well oriented, appropriate affect Breasts: Status post bilateral mastectomies with bilateral reconstruction. There is no evidence of chest wall recurrence. Both axillae are benign.   LAB RESULTS: Lab Results  Component Value Date   WBC 4.7 12/29/2014   NEUTROABS 2.3 12/29/2014   HGB 13.8 12/29/2014   HCT 41.1 12/29/2014   MCV 97.3 12/29/2014   PLT 190 12/29/2014      Chemistry      Component Value Date/Time   NA 138 12/29/2014 0908   NA 140 01/21/2013 0645   K 4.5 12/29/2014  0908   K 4.1 01/21/2013 0645   CL 104 01/21/2013 0645   CL 105 08/07/2012 0808   CO2 26 12/29/2014 0908   CO2 25 01/21/2013 0645   BUN 13.4 12/29/2014 0908   BUN 9 01/21/2013 0645   CREATININE 0.8 12/29/2014 0908   CREATININE 0.73 01/21/2013 0645      Component Value Date/Time   CALCIUM 9.9 12/29/2014 0908   CALCIUM 9.8 01/21/2013 0645   ALKPHOS 65 12/29/2014 0908   ALKPHOS 54 06/07/2012 1053   AST 29 12/29/2014 0908   AST 27 06/07/2012 1053   ALT 22 12/29/2014 0908   ALT 16 06/07/2012 1053   BILITOT 0.96 12/29/2014 0908   BILITOT 0.3 06/07/2012 1053       Lab Results  Component Value Date   LABCA2 23 03/28/2012     STUDIES: No results found.  ASSESSMENT: 49 y.o. BRCA-2 positive Starling Manns woman   (1)  status post right breast biopsy 03/20/2012 for a clinical T1b N0, stage IA invasive ductal carcinoma, grade 1-2, triple negative, with an MIB-1 of 100%  (2) biopsy of a suspicious left breast mass was benign  (3) status post bilateral mastectomies with right sentinel lymph node sampling and immediate expander placement 06/14/2012, for a right-sided pT1c pN0, stage IA invasive ductal carcinoma, grade 1, with ample margins, triple negative,  with an MIB-1 of 100%. (The left breast was benign)  (4) received 4 dose dense cycles of doxorubicin/ cyclophosphamide followed by 4 dose dense cycles of paclitaxel completed 11/05/2012  (5) BRCA2 positivity: The patient is status post bilateral mastectomies, and also status post  hysterectomy and bilateral salpingo-oophorectomy 01/21/2013 with benign pathology  PLAN:  Kylyn is doing fine, now 2-1/2 years out from her definitive surgery, with no evidence of disease recurrence. Because triple negative breast cancer, if there occurred, tend to recur early, this is very favorable.  I am going to start seeing her on a once a year basis. Her next appointment here will be in May, at the time of her 3 year anniversary.  She just turned 49.  She would prefer to wait on colonoscopy until the second-half of next year so she will had been in her new job for at least 6 months. I will set her up for that.  Otherwise the plan is to test her children when they  are in their mid-20s. She has also informed the rest of the family who are being tested at their discretion.  Nayelli knows to call for any problems that may develop before her next visit here.  Chauncey Cruel, MD     01/05/2015

## 2015-01-05 NOTE — Telephone Encounter (Signed)
Appointments made and avs pritned °

## 2015-07-06 ENCOUNTER — Other Ambulatory Visit (HOSPITAL_BASED_OUTPATIENT_CLINIC_OR_DEPARTMENT_OTHER): Payer: 59

## 2015-07-06 ENCOUNTER — Ambulatory Visit (HOSPITAL_BASED_OUTPATIENT_CLINIC_OR_DEPARTMENT_OTHER): Payer: 59 | Admitting: Oncology

## 2015-07-06 ENCOUNTER — Telehealth: Payer: Self-pay | Admitting: Oncology

## 2015-07-06 VITALS — BP 126/73 | HR 66 | Temp 98.4°F | Resp 18 | Ht 66.0 in | Wt 171.9 lb

## 2015-07-06 DIAGNOSIS — Z1509 Genetic susceptibility to other malignant neoplasm: Principal | ICD-10-CM

## 2015-07-06 DIAGNOSIS — Z853 Personal history of malignant neoplasm of breast: Secondary | ICD-10-CM | POA: Diagnosis not present

## 2015-07-06 DIAGNOSIS — C50412 Malignant neoplasm of upper-outer quadrant of left female breast: Secondary | ICD-10-CM

## 2015-07-06 DIAGNOSIS — C50411 Malignant neoplasm of upper-outer quadrant of right female breast: Secondary | ICD-10-CM

## 2015-07-06 DIAGNOSIS — Z1501 Genetic susceptibility to malignant neoplasm of breast: Secondary | ICD-10-CM

## 2015-07-06 LAB — CBC WITH DIFFERENTIAL/PLATELET
BASO%: 0.5 % (ref 0.0–2.0)
BASOS ABS: 0 10*3/uL (ref 0.0–0.1)
EOS ABS: 0.1 10*3/uL (ref 0.0–0.5)
EOS%: 2.5 % (ref 0.0–7.0)
HCT: 37.9 % (ref 34.8–46.6)
HGB: 13.2 g/dL (ref 11.6–15.9)
LYMPH%: 36.7 % (ref 14.0–49.7)
MCH: 33.4 pg (ref 25.1–34.0)
MCHC: 34.8 g/dL (ref 31.5–36.0)
MCV: 96.1 fL (ref 79.5–101.0)
MONO#: 0.5 10*3/uL (ref 0.1–0.9)
MONO%: 10.6 % (ref 0.0–14.0)
NEUT%: 49.7 % (ref 38.4–76.8)
NEUTROS ABS: 2.1 10*3/uL (ref 1.5–6.5)
PLATELETS: 185 10*3/uL (ref 145–400)
RBC: 3.94 10*6/uL (ref 3.70–5.45)
RDW: 12.5 % (ref 11.2–14.5)
WBC: 4.2 10*3/uL (ref 3.9–10.3)
lymph#: 1.6 10*3/uL (ref 0.9–3.3)

## 2015-07-06 LAB — COMPREHENSIVE METABOLIC PANEL
ALBUMIN: 4.3 g/dL (ref 3.5–5.0)
ALK PHOS: 62 U/L (ref 40–150)
ALT: 20 U/L (ref 0–55)
ANION GAP: 9 meq/L (ref 3–11)
AST: 28 U/L (ref 5–34)
BILIRUBIN TOTAL: 0.81 mg/dL (ref 0.20–1.20)
BUN: 12.3 mg/dL (ref 7.0–26.0)
CO2: 24 meq/L (ref 22–29)
Calcium: 9.7 mg/dL (ref 8.4–10.4)
Chloride: 107 mEq/L (ref 98–109)
Creatinine: 0.9 mg/dL (ref 0.6–1.1)
EGFR: 79 mL/min/{1.73_m2} — AB (ref >90)
Glucose: 107 mg/dl (ref 70–140)
Potassium: 3.9 mEq/L (ref 3.5–5.1)
Sodium: 141 mEq/L (ref 136–145)
TOTAL PROTEIN: 6.8 g/dL (ref 6.4–8.3)

## 2015-07-06 MED ORDER — ESCITALOPRAM OXALATE 20 MG PO TABS: 20.0000 mg | ORAL_TABLET | Freq: Every day | ORAL | Status: AC

## 2015-07-06 NOTE — Progress Notes (Signed)
ID: [name]   DOB: [birth year]  MR#: [id number]  TIR#:[id number]  PCP: Norma Coffin [name] GYN: Norma [name] M.D. Norma [name]]; Norma [name] M.D. OTHER [name]: Norma [name], [address] Norma [name] [phone])  CHIEF COMPLAINT:  BRCA-2 positive, triple negative breast cancer  CURRENT TREATMENT: Observation  BREAST CANCER HISTORY: From the original intake note:  Norma [name] had routine screening mammography at [facility] 11/16/2010 suggesting additional imaging on the right. This was performed the next day, and found no significant residual abnormality. Repeat screening mammography 03/15/2012 again suggested an area of architectural distortion in the right breast. In the same quadrant as before. Additional views 03/19/2012 found a 3 mm hypoechoic mass in the area in question. Biopsy of this mass 03/20/2012 showed an invasive ductal carcinoma, grade 1-2, triple negative, with an MIB-1 of 100%.  Breast MRI 03/23/2012 showed an area of poorly defined enhancement in the right breast associated with clip artifact. This measured 1 cm. In the left breast there was an enhancing mass measuring 2.0 cm. There were no other suspicious findings.   The patient's subsequent history is as detailed below.  INTERVAL HISTORY: Norma [name] returns today for follow up of her breast cancer. The interval history is generally unremarkable. She took herself off Wellbutrin without tapering and without letting us know and fortunately did fine with that. She did have an episode of "a near panic attack" in her new job which may have been precipitated by that. At any rate she has been off the medication now for 2 months and really does not notice any change. She continues on Lexapro.   REVIEW OF SYSTEMS: Norma [name] exercises through a gym at work about 1-3 times a week. A detailed review of systems today was otherwise entirely negative  PAST MEDICAL HISTORY: Past Medical History  Diagnosis Date   . PONV (postoperative nausea and vomiting)   . Depression   . Breast cancer 03/20/2012    masectomy/implants  . SVD (spontaneous vaginal delivery)     x 2    PAST SURGICAL HISTORY: Past Surgical History  Procedure Laterality Date  . Anterior cruciate ligament repair  2010  . Endometrial ablation      2012  . Dilation and curettage of uterus    . Basal cell carcinoma excision      OFF NOSE  2011   . Simple mastectomy w/ sentinel node biopsy Bilateral 06/15/2012    Dr Norma [name]  . Simple mastectomy with axillary sentinel node biopsy Right 06/14/2012    Procedure: SIMPLE MASTECTOMY WITH AXILLARY SENTINEL NODE BIOPSY;  Surgeon: Norma Lasso, [name];  Location: [place];  Service: General;  Laterality: Right;  . Total mastectomy Left 06/14/2012    Procedure: TOTAL MASTECTOMY;  Surgeon: Norma Lasso, [name];  Location: [place];  Service: General;  Laterality: Left;  . Breast reconstruction with placement of tissue expander and flex hd (acellular hydrated dermis) Bilateral 06/14/2012    Procedure: BILATERAL BREAST RECONSTRUCTION WITH PLACEMENT OF BILATERAL TISSUE EXPANDERS;  Surgeon: Norma Reese, [name];  Location: [facility];  Service: Plastics;  Laterality: Bilateral;  . Wisdom tooth extraction    . Eye surgery  2000    bilateral  . Laparoscopic assisted vaginal hysterectomy N/A 01/21/2013    Procedure: LAPAROSCOPIC ASSISTED VAGINAL HYSTERECTOMY;  Surgeon: Norma [name];  Location: [facility];  Service: Gynecology;  Laterality: N/A;  . Laparoscopic bilateral salpingo oopherectomy Bilateral 01/21/2013    Procedure: LAPAROSCOPIC BILATERAL SALPINGO OOPHORECTOMY;  Surgeon: Norma Sarah.  Landry Mellow, [name];  Location: [facility];  Service: Gynecology;  Laterality: Bilateral;  . Placement of breast implants    . Incision and drainage of wound Left 06/19/2013    Procedure: LEFT IRRIGATION AND DEBRIDEMENT WITH ACCELL/VAC PLACEMENT;  Surgeon: Norma Kos, DO;  Location: [facility];  Service: Plastics;  Laterality: Left;   Moh's surgery for basal cell, Left nares  FAMILY HISTORY Family History  Problem Relation Age of Onset  . Breast cancer Maternal Aunt     dx in her 69s  . Ovarian cancer Maternal Aunt     dx in her 54s  . Esophageal cancer Maternal Uncle   . Liver cancer Maternal Uncle   . Lung cancer Maternal Uncle   . BRCA 1/2 Maternal Uncle     BRCA 2 +  . Breast cancer Cousin     dx in her 67s; BRCA2+  . Pancreatic cancer Maternal Uncle     dx in his 72s  . BRCA 1/2 Maternal Uncle     BRCA2+  . Colon cancer Maternal Uncle    the patient's parents are alive, in their early 62s. The patient has one brother and one sister. There is a significant family history of cancer and this includes one of the patient's mother is 2 sisters with ovarian and breast cancer. A first cousin of the patient had breast cancer in her 91s. She has been tested for the BRCA gene, but the patient does not know the results. There were also brothers of the patient's mother with esophageal pancreas and colon cancer. The patient has been genetically tested and does carry a BRCA 2 mutation  GYNECOLOGIC HISTORY: (Updated May 2017)   Menarche age 19, first live birth age 58, she is [name]. The patient's periods were irregular, but still ongoing at the time of diagnosis. She did take birth control pills for approximately 10 years with no untoward events.  Status post TAH/BSO in December 2014.  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 Her ex-husband Norma [name] is a Archivist. He owns his own business. They are divorced. He is originally from [place]. Their children are Norma [name] , 20, who attends college in [place], and Norma [name] 17, who is a Paramedic in [employer], with mild autism. At home it's the patient, Norma [name], and recently the patient's sister has moved in with them. She sees status is generally a good thing.     ADVANCED  DIRECTIVES: In place. Norma [name] has named her sister Wende Mott as her healthcare power of attorney. [name] can be reached at [place]: Social History  Substance Use Topics  . Smoking status: Never Smoker   . Smokeless tobacco: Never Used  . Alcohol Use: Yes     Comment: socially     Colonoscopy: Never  PAP:  UTD, Dr. Landry [name]  Bone density:  Never  Lipid panel:  Followed by Dr. Leonides Schanz   Allergies  Allergen Reactions  . Adhesive [Tape] Rash    Rash  . Penicillins Nausea Only and Rash    Current Outpatient Prescriptions  Medication Sig Dispense Refill  . buPROPion (WELLBUTRIN XL) 300 MG 24 hr tablet Take 300 mg by mouth every morning.     . escitalopram (LEXAPRO) 20 MG tablet TAKE 1 TABLET BY MOUTH DAILY 30 tablet 3   No current facility-administered medications for this visit.    OBJECTIVE: Middle-aged white  womanWho appears well Filed Vitals:   07/06/15 0832  BP: 126/73  Pulse: 66  Temp: 98.4 F (36.9 C)  Resp: 18     Body mass index is 27.76 kg/(m^2).    ECOG FS: 0 Filed Weights   07/06/15 0832  Weight: 171 lb 14.4 oz (77.973 kg)   Sclerae unicteric, pupils round and equal Oropharynx clear and moist-- no thrush or other lesions No cervical or supraclavicular adenopathy Lungs no rales or rhonchi Heart regular rate and rhythm Abd soft, nontender, positive bowel sounds MSK no focal spinal tenderness, no upper extremity lymphedema Neuro: nonfocal, well oriented, appropriate affect Breasts: Status post bilateral mastectomies with bilateral reconstruction. There is no evidence of local recurrence. Both axillae are benign.     LAB RESULTS: Lab Results  Component Value Date   WBC 4.2 07/06/2015   NEUTROABS 2.1 07/06/2015   HGB 13.2 07/06/2015   HCT 37.9 07/06/2015   MCV 96.1 07/06/2015   PLT 185 07/06/2015      Chemistry      Component Value Date/Time   NA 138 12/29/2014 0908   NA 140 01/21/2013 0645   K 4.5 12/29/2014 0908   K 4.1  01/21/2013 0645   CL 104 01/21/2013 0645   CL 105 08/07/2012 0808   CO2 26 12/29/2014 0908   CO2 25 01/21/2013 0645   BUN 13.4 12/29/2014 0908   BUN 9 01/21/2013 0645   CREATININE 0.8 12/29/2014 0908   CREATININE 0.73 01/21/2013 0645      Component Value Date/Time   CALCIUM 9.9 12/29/2014 0908   CALCIUM 9.8 01/21/2013 0645   ALKPHOS 65 12/29/2014 0908   ALKPHOS 54 06/07/2012 1053   AST 29 12/29/2014 0908   AST 27 06/07/2012 1053   ALT 22 12/29/2014 0908   ALT 16 06/07/2012 1053   BILITOT 0.96 12/29/2014 0908   BILITOT 0.3 06/07/2012 1053       Lab Results  Component Value Date   LABCA2 23 03/28/2012     STUDIES: No results found.  ASSESSMENT: 50 y.o. BRCA-2 positive Norma [name] woman   (1)  status post right breast biopsy 03/20/2012 for a clinical T1b N0, stage IA invasive ductal carcinoma, grade 1-2, triple negative, with an MIB-1 of 100%  (2) biopsy of a suspicious left breast mass was benign  (3) status post bilateral mastectomies with right sentinel lymph node sampling and immediate expander placement 06/14/2012, for a right-sided pT1c pN0, stage IA invasive ductal carcinoma, grade 1, with ample margins, triple negative,  with an MIB-1 of 100%. (The left breast was benign)  (4) received 4 dose dense cycles of doxorubicin/ cyclophosphamide followed by 4 dose dense cycles of paclitaxel completed 11/05/2012  (5) BRCA2 positivity: The patient is status post bilateral mastectomies, and also status post  hysterectomy and bilateral salpingo-oophorectomy 01/21/2013 with benign pathology  PLAN:  Mabell is Now 3 years out from definitive surgery for her early stage breast cancer, with no evidence of disease recurrence. This is very favorable.  We are going to continue to see her on a once a year basis until she has the 5 year mark at which point we will likely refer her to our survivorship program.  Today give her information on our intimacy and pelvic health program. I also  referred her to GI for screening colonoscopy  Kellycall for any problems that may develop before her next visit here, a year from now.  Chauncey Cruel, [name]     07/06/2015

## 2015-07-06 NOTE — Telephone Encounter (Signed)
appt made and avs printed °

## 2015-07-07 ENCOUNTER — Encounter: Payer: Self-pay | Admitting: Gastroenterology

## 2015-08-21 ENCOUNTER — Encounter: Payer: 59 | Admitting: Gastroenterology

## 2015-08-24 ENCOUNTER — Ambulatory Visit: Payer: Self-pay | Admitting: Surgery

## 2015-11-09 ENCOUNTER — Encounter: Payer: 59 | Admitting: Gastroenterology

## 2015-12-14 ENCOUNTER — Ambulatory Visit (AMBULATORY_SURGERY_CENTER): Payer: Self-pay | Admitting: *Deleted

## 2015-12-14 VITALS — Ht 66.5 in | Wt 159.2 lb

## 2015-12-14 DIAGNOSIS — Z1211 Encounter for screening for malignant neoplasm of colon: Secondary | ICD-10-CM

## 2015-12-14 MED ORDER — NA SULFATE-K SULFATE-MG SULF 17.5-3.13-1.6 GM/177ML PO SOLN: 1.0000 | Freq: Once | ORAL | 0 refills | Status: AC

## 2015-12-14 NOTE — Progress Notes (Signed)
No allergies to eggs or soy. No problems with anesthesia.  Pt given Emmi instructions for colonoscopy  No oxygen use  No diet drug use  

## 2015-12-15 ENCOUNTER — Encounter: Payer: Self-pay | Admitting: Gastroenterology

## 2015-12-28 ENCOUNTER — Encounter: Payer: Self-pay | Admitting: Gastroenterology

## 2015-12-28 ENCOUNTER — Ambulatory Visit (AMBULATORY_SURGERY_CENTER): Payer: 59 | Admitting: Gastroenterology

## 2015-12-28 VITALS — BP 140/69 | HR 52 | Temp 98.0°F | Resp 11 | Ht 66.5 in | Wt 159.0 lb

## 2015-12-28 DIAGNOSIS — D122 Benign neoplasm of ascending colon: Secondary | ICD-10-CM | POA: Diagnosis not present

## 2015-12-28 DIAGNOSIS — Z1211 Encounter for screening for malignant neoplasm of colon: Secondary | ICD-10-CM | POA: Diagnosis present

## 2015-12-28 DIAGNOSIS — Z1212 Encounter for screening for malignant neoplasm of rectum: Secondary | ICD-10-CM | POA: Diagnosis not present

## 2015-12-28 MED ORDER — SODIUM CHLORIDE 0.9 % IV SOLN: 500.0000 mL | INTRAVENOUS | Status: AC

## 2015-12-28 NOTE — Patient Instructions (Signed)
YOU HAD AN ENDOSCOPIC PROCEDURE TODAY AT THE [facility]:   Refer to the procedure report that was given to you for any specific questions about what was found during the examination.  If the procedure report does not answer your questions, please call your gastroenterologist to clarify.  If you requested that your care partner not be given the details of your procedure findings, then the procedure report has been included in a sealed envelope for you to review at your convenience later.  YOU SHOULD EXPECT: Some feelings of bloating in the abdomen. Passage of more gas than usual.  Walking can help get rid of the air that was put into your GI tract during the procedure and reduce the bloating. If you had a lower endoscopy (such as a colonoscopy or flexible sigmoidoscopy) you may notice spotting of blood in your stool or on the toilet paper. If you underwent a bowel prep for your procedure, you may not have a normal bowel movement for a few days.  Please Note:  You might notice some irritation and congestion in your nose or some drainage.  This is from the oxygen used during your procedure.  There is no need for concern and it should clear up in a day or so.  SYMPTOMS TO REPORT IMMEDIATELY:   Following lower endoscopy (colonoscopy or flexible sigmoidoscopy):  Excessive amounts of blood in the stool  Significant tenderness or worsening of abdominal pains  Swelling of the abdomen that is new, acute  Fever of 100F or higher  For urgent or emergent issues, a gastroenterologist can be reached at any hour by calling [phone].   DIET:  We do recommend a small meal at first, but then you may proceed to your regular diet.  Drink plenty of fluids but you should avoid alcoholic beverages for 24 hours.  ACTIVITY:  You should plan to take it easy for the rest of today and you should NOT DRIVE or use heavy machinery until tomorrow (because of the sedation medicines used during the test).     FOLLOW UP: Our staff will call the number listed on your records the next business day following your procedure to check on you and address any questions or concerns that you may have regarding the information given to you following your procedure. If we do not reach you, we will leave a message.  However, if you are feeling well and you are not experiencing any problems, there is no need to return our call.  We will assume that you have returned to your regular daily activities without incident.  If any biopsies were taken you will be contacted by phone or by letter within the next 1-3 weeks.  Please call us at [phone] if you have not heard about the biopsies in 3 weeks.   SIGNATURES/CONFIDENTIALITY: You and/or your care partner have signed paperwork which will be entered into your electronic medical record.  These signatures attest to the fact that that the information above on your After Visit Summary has been reviewed and is understood.  Full responsibility of the confidentiality of this discharge information lies with you and/or your care-partner.  Await pathology Please read over handout about polyps    Continue your normal medications 

## 2015-12-28 NOTE — Op Note (Signed)
[facility] Patient Name: Norma [name] Procedure Date: 12/28/2015 10:33 AM MRN: [id number] Endoscopist: Mallie Mussel L. Loletha Carrow , MD Age: 50 Referring MD:  Date of Birth: Nov 04, 1965 Gender: Female Account #: [id number] Procedure:                Colonoscopy Indications:              Screening for colorectal malignant neoplasm, This                            is the patient's first colonoscopy Medicines:                Monitored Anesthesia Care Procedure:                Pre-Anesthesia Assessment:                           - Prior to the procedure, a History and Physical                            was performed, and patient medications and                            allergies were reviewed. The patient's tolerance of                            previous anesthesia was also reviewed. The risks                            and benefits of the procedure and the sedation                            options and risks were discussed with the patient.                            All questions were answered, and informed consent                            was obtained. Prior Anticoagulants: The patient has                            taken no previous anticoagulant or antiplatelet                            agents. ASA Grade Assessment: II - A patient with                            mild systemic disease. After reviewing the risks                            and benefits, the patient was deemed in                            satisfactory condition to undergo the procedure.  After obtaining informed consent, the colonoscope                            was passed under direct vision. Throughout the                            procedure, the patient's blood pressure, pulse, and                            oxygen saturations were monitored continuously. The                            Model CF-HQ190L [id number]) scope was introduced                            through the anus and  advanced to the the cecum,                            identified by appendiceal orifice and ileocecal                            valve. The ileocecal valve, appendiceal orifice,                            and rectum were photographed. The quality of the                            bowel preparation was excellent. The colonoscopy                            was performed without difficulty. The patient                            tolerated the procedure well. The bowel preparation                            used was SUPREP. Scope In: 11:00:42 AM Scope Out: 11:11:49 AM Scope Withdrawal Time: 0 hours 8 minutes 27 seconds  Total Procedure Duration: 0 hours 11 minutes 7 seconds  Findings:                 A 1 mm polyp was found in the proximal ascending                            colon. The polyp was sessile. The polyp was removed                            in one piece with a cold biopsy forceps. Resection                            and retrieval were complete. Complications:            No immediate complications. Estimated blood loss:  None. Estimated Blood Loss:     Estimated blood loss: none. Recommendation:           - Patient has a contact number available for                            emergencies. The signs and symptoms of potential                            delayed complications were discussed with the                            patient. Return to normal activities tomorrow.                            Written discharge instructions were provided to the                            patient.                           - Resume previous diet.                           - Continue present medications.                           - Await pathology results.                           - Repeat colonoscopy is recommended for                            surveillance. The colonoscopy date will be                            determined after pathology results from today's                             exam become available for review. Hamilton Marinello L. Loletha Carrow, MD 12/28/2015 11:15:46 AM This report has been signed electronically.

## 2015-12-28 NOTE — Progress Notes (Signed)
Pt took her lexapro this am at 0900 am with approximately 1-2 oz of water.  Boyce Medici, CMA reported this to Charleston Ropes, CRNA.  Okay to proceed with colonoscopy. maw

## 2015-12-28 NOTE — Progress Notes (Signed)
Called to room to assist during endoscopic procedure.  Patient ID and intended procedure confirmed with present staff. Received instructions for my participation in the procedure from the performing physician.  

## 2015-12-29 ENCOUNTER — Telehealth: Payer: Self-pay

## 2015-12-29 NOTE — Telephone Encounter (Signed)
  Follow up Call-  Call back number 12/28/2015  Post procedure Call Back phone  # [phone] cell  Permission to leave phone message Yes  Some recent data might be hidden     Patient questions:  Do you have a fever, pain , or abdominal swelling? No. Pain Score  0 *  Have you tolerated food without any problems? Yes.    Have you been able to return to your normal activities? Yes.    Do you have any questions about your discharge instructions: Diet   No. Medications  No. Follow up visit  No.  Do you have questions or concerns about your Care? No.  Actions: * If pain score is 4 or above: No action needed, pain <4.

## 2015-12-29 NOTE — Telephone Encounter (Signed)
Left message on answering machine. 

## 2016-01-04 ENCOUNTER — Encounter: Payer: Self-pay | Admitting: Gastroenterology

## 2016-02-11 ENCOUNTER — Other Ambulatory Visit: Payer: Self-pay | Admitting: Nurse Practitioner

## 2016-04-08 ENCOUNTER — Ambulatory Visit (HOSPITAL_BASED_OUTPATIENT_CLINIC_OR_DEPARTMENT_OTHER): Admit: 2016-04-08 | Payer: 59 | Admitting: Plastic Surgery

## 2016-04-08 ENCOUNTER — Encounter (HOSPITAL_BASED_OUTPATIENT_CLINIC_OR_DEPARTMENT_OTHER): Payer: Self-pay

## 2016-04-08 SURGERY — RECONSTRUCTION, BREAST
Anesthesia: General | Laterality: Bilateral

## 2016-06-24 ENCOUNTER — Telehealth: Payer: Self-pay

## 2016-06-24 ENCOUNTER — Telehealth: Payer: Self-pay | Admitting: *Deleted

## 2016-06-24 NOTE — Telephone Encounter (Signed)
Call received from [name] stating she was notified of a pending appointment for this coming Monday and need for possible reschedule due to recent job change and currently uninsured. Appointment is for routine follow up.  Per phone discussion pt inquired about cost of visit per self pay or if ok to reschedule to later date once she has insurance in place.  Appointment rescheduled - to mid July. Note pt is now working for [employer] and coverage for follow up may not be covered for this office- pt may need to be referred to [facility] oncology.  Plan per call is appointment rescheduled to mid July 2018 to allow for establishing of new insurance. Norma [name] will then inquire about coverage per this office or need to be referred - she will contact this office if referral needed.  No other needs at this time.

## 2016-06-24 NOTE — Telephone Encounter (Signed)
Open chart in error

## 2016-06-27 ENCOUNTER — Other Ambulatory Visit: Payer: 59

## 2016-07-04 ENCOUNTER — Ambulatory Visit: Payer: 59 | Admitting: Oncology

## 2016-08-23 ENCOUNTER — Other Ambulatory Visit: Payer: Self-pay | Admitting: *Deleted

## 2016-08-24 ENCOUNTER — Other Ambulatory Visit: Payer: 59

## 2016-08-24 ENCOUNTER — Ambulatory Visit: Payer: Self-pay | Admitting: Oncology

## 2016-08-25 ENCOUNTER — Other Ambulatory Visit: Payer: Self-pay | Admitting: *Deleted

## 2016-08-25 DIAGNOSIS — Z171 Estrogen receptor negative status [ER-]: Principal | ICD-10-CM

## 2016-08-25 DIAGNOSIS — C50411 Malignant neoplasm of upper-outer quadrant of right female breast: Secondary | ICD-10-CM

## 2016-08-25 DIAGNOSIS — Z1509 Genetic susceptibility to other malignant neoplasm: Secondary | ICD-10-CM

## 2016-08-25 DIAGNOSIS — Z1501 Genetic susceptibility to malignant neoplasm of breast: Secondary | ICD-10-CM

## 2019-05-13 ENCOUNTER — Ambulatory Visit: Payer: Self-pay | Attending: Internal Medicine

## 2019-05-13 DIAGNOSIS — Z23 Encounter for immunization: Secondary | ICD-10-CM

## 2019-05-13 NOTE — Progress Notes (Signed)
   Covid-19 Vaccination Clinic  Name:  Norma [name]    MRN: [id number] DOB: 07/24/1965  05/13/2019  Ms. Norma [name] was observed post Covid-19 immunization for 15 minutes without incident. She was provided with Vaccine Information Sheet and instruction to access the V-Safe system.   Ms. Norma [name] was instructed to call 911 with any severe reactions post vaccine: Marland Kitchen Difficulty breathing  . Swelling of face and throat  . A fast heartbeat  . A bad rash all over body  . Dizziness and weakness   Immunizations Administered    Name Date Dose VIS Date Route   Pfizer COVID-19 Vaccine 05/13/2019  2:58 PM 0.3 mL 01/25/2019 Intramuscular   Manufacturer: [place]   Lot: [id number]   [place]: [facility]  Name:  Norma [name]    MRN: [id number] DOB: [birth year]  05/13/2019  Ms. Norma [name] was observed post Covid-19 immunization for 15 minutes without incident. She was provided with Vaccine Information Sheet and instruction to access the V-Safe system.   Ms. Norma [name] was instructed to call 911 with any severe reactions post vaccine: Marland Kitchen Difficulty breathing  . Swelling of face and throat  . A fast heartbeat  . A bad rash all over body  . Dizziness and weakness   Immunizations Administered    Name Date Dose VIS Date Route   Pfizer COVID-19 Vaccine 05/13/2019  2:58 PM 0.3 mL 01/25/2019 Intramuscular   Manufacturer: [place]   Lot: [id number]   [place]: [id number]

## 2019-06-05 ENCOUNTER — Ambulatory Visit: Payer: Self-pay | Attending: Internal Medicine

## 2019-06-05 DIAGNOSIS — Z23 Encounter for immunization: Secondary | ICD-10-CM

## 2019-06-05 NOTE — Progress Notes (Signed)
   Covid-19 Vaccination Clinic  Name:  Rei Drummonds    MRN: [id number] DOB: 1965-05-13  06/05/2019  Ms. Henkels was observed post Covid-19 immunization for 15 minutes without incident. She was provided with Vaccine Information Sheet and instruction to access the V-Safe system.   Ms. Poertner was instructed to call 911 with any severe reactions post vaccine: Marland Kitchen Difficulty breathing  . Swelling of face and throat  . A fast heartbeat  . A bad rash all over body  . Dizziness and weakness   Immunizations Administered    Name Date Dose VIS Date Route   Pfizer COVID-19 Vaccine 06/05/2019  1:51 PM 0.3 mL 04/10/2018 Intramuscular   Manufacturer: [place]   Lot: [id number]   [place]: [id number]

## 2021-05-14 ENCOUNTER — Encounter: Payer: Self-pay | Admitting: Gastroenterology

## 2022-09-09 ENCOUNTER — Other Ambulatory Visit: Payer: Self-pay | Admitting: Family Medicine

## 2022-09-09 DIAGNOSIS — E7800 Pure hypercholesterolemia, unspecified: Secondary | ICD-10-CM

## 2022-10-03 ENCOUNTER — Ambulatory Visit
Admission: RE | Admit: 2022-10-03 | Discharge: 2022-10-03 | Disposition: A | Discharge status: Home / Self Care | Payer: Self-pay | Source: Ambulatory Visit | Attending: Family Medicine | Admitting: Family Medicine

## 2022-10-03 DIAGNOSIS — E7800 Pure hypercholesterolemia, unspecified: Secondary | ICD-10-CM

## 2023-01-04 ENCOUNTER — Encounter: Payer: Self-pay | Admitting: Gastroenterology
